# Patient Record
Sex: Male | Born: 1945 | Race: Black or African American | Hispanic: No | Smoking: Former smoker
Health system: Southern US, Community
[De-identification: ages and names within clinical notes are randomized; demographics above are authoritative.]

## PROBLEM LIST (undated history)

## (undated) DIAGNOSIS — E119 Type 2 diabetes mellitus without complications: Secondary | ICD-10-CM

## (undated) DIAGNOSIS — Z992 Dependence on renal dialysis: Secondary | ICD-10-CM

## (undated) DIAGNOSIS — I429 Cardiomyopathy, unspecified: Secondary | ICD-10-CM

## (undated) DIAGNOSIS — N186 End stage renal disease: Secondary | ICD-10-CM

## (undated) DIAGNOSIS — I10 Essential (primary) hypertension: Secondary | ICD-10-CM

## (undated) DIAGNOSIS — M869 Osteomyelitis, unspecified: Secondary | ICD-10-CM

## (undated) DIAGNOSIS — F4310 Post-traumatic stress disorder, unspecified: Secondary | ICD-10-CM

## (undated) HISTORY — PX: AMPUTATION: SHX166

## (undated) HISTORY — PX: CARDIAC SURGERY: SHX584

---
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
## 2009-11-15 ENCOUNTER — Ambulatory Visit: Payer: Self-pay | Admitting: Cardiovascular Disease

## 2009-11-15 ENCOUNTER — Inpatient Hospital Stay (HOSPITAL_COMMUNITY): Admission: EM | Admit: 2009-11-15 | Discharge: 2009-11-19 | Payer: Self-pay | Admitting: Emergency Medicine

## 2009-11-16 ENCOUNTER — Encounter: Payer: Self-pay | Admitting: Cardiovascular Disease

## 2009-11-17 ENCOUNTER — Encounter: Payer: Self-pay | Admitting: Cardiovascular Disease

## 2009-11-20 ENCOUNTER — Telehealth: Payer: Self-pay | Admitting: Cardiovascular Disease

## 2009-11-30 DIAGNOSIS — E669 Obesity, unspecified: Secondary | ICD-10-CM | POA: Insufficient documentation

## 2009-11-30 DIAGNOSIS — Z87891 Personal history of nicotine dependence: Secondary | ICD-10-CM

## 2009-11-30 DIAGNOSIS — I2510 Atherosclerotic heart disease of native coronary artery without angina pectoris: Secondary | ICD-10-CM | POA: Insufficient documentation

## 2009-11-30 DIAGNOSIS — I10 Essential (primary) hypertension: Secondary | ICD-10-CM | POA: Insufficient documentation

## 2009-11-30 DIAGNOSIS — E785 Hyperlipidemia, unspecified: Secondary | ICD-10-CM

## 2009-11-30 DIAGNOSIS — K219 Gastro-esophageal reflux disease without esophagitis: Secondary | ICD-10-CM | POA: Insufficient documentation

## 2009-11-30 DIAGNOSIS — E119 Type 2 diabetes mellitus without complications: Secondary | ICD-10-CM

## 2009-11-30 DIAGNOSIS — I2119 ST elevation (STEMI) myocardial infarction involving other coronary artery of inferior wall: Secondary | ICD-10-CM

## 2009-11-30 DIAGNOSIS — N259 Disorder resulting from impaired renal tubular function, unspecified: Secondary | ICD-10-CM | POA: Insufficient documentation

## 2009-12-31 ENCOUNTER — Encounter (INDEPENDENT_AMBULATORY_CARE_PROVIDER_SITE_OTHER): Payer: Self-pay | Admitting: *Deleted

## 2010-11-30 NOTE — Letter (Signed)
Summary: Appointment - Missed  [facility], [place]  [address]   [place], [place] [address]   Phone: [phone]  Fax: [phone]     December 31, 2009 MRN: [id number]   JERSEN NULTY [address], [place]  [address]   Dear Mr. Heisler,  Our records indicate you missed your appointment on 12/11/2009 with Dr. Angelena Form.                                    It is very important that we reach you to reschedule this appointment. We look forward to participating in your health care needs. Please contact us at the number listed above at your earliest convenience to reschedule this appointment.     Sincerely,  LG  Public relations account executive

## 2010-11-30 NOTE — Progress Notes (Signed)
Summary: med question  Phone Note Call from Patient Call back at Home Phone [phone]   Caller: Patient Reason for Call: Talk to Nurse Summary of Call: has some med questions Initial call taken by: Darnell Level,  November 20, 2009 1:22 PM  Follow-up for Phone Call        Spoke with pt. Patient  post Drug-eluting stent placement discharged fom [facility] hospital on 11/19/09. Pt would like to know how important is for him to get the prescription Prasugrel fill. Pt. states 30 pills l cost $200.00. RN let pt. know it is very important for him to get medication asap because of the stenting. I let pt. know we do not have med. sample at this time, but we have a co-pay card  to cover a portion of medication. A card was placed at the front desk for pt. Patient aware. Follow-up by: Carollee Sires, RN, BSN,  November 20, 2009 2:11 PM     Appended Document: med question A free sample offer card to receive a 30 day free tablets of Prasugrel placed at the front desk for pt. pt. aware.

## 2011-01-17 LAB — CBC
Hemoglobin: 12.3 g/dL — ABNORMAL LOW (ref 13.0–17.0)
MCHC: 33.2 g/dL (ref 30.0–36.0)
MCHC: 34.1 g/dL (ref 30.0–36.0)
MCV: 86.7 fL (ref 78.0–100.0)
MCV: 87 fL (ref 78.0–100.0)
Platelets: 202 10*3/uL (ref 150–400)
Platelets: 235 10*3/uL (ref 150–400)
Platelets: 280 10*3/uL (ref 150–400)
RBC: 4.16 MIL/uL — ABNORMAL LOW (ref 4.22–5.81)
RBC: 4.22 MIL/uL (ref 4.22–5.81)
RBC: 4.99 MIL/uL (ref 4.22–5.81)
WBC: 10.1 10*3/uL (ref 4.0–10.5)
WBC: 10.9 10*3/uL — ABNORMAL HIGH (ref 4.0–10.5)
WBC: 7.8 10*3/uL (ref 4.0–10.5)
WBC: 9.1 10*3/uL (ref 4.0–10.5)

## 2011-01-17 LAB — BASIC METABOLIC PANEL
BUN: 13 mg/dL (ref 6–23)
BUN: 8 mg/dL (ref 6–23)
CO2: 27 mEq/L (ref 19–32)
CO2: 29 mEq/L (ref 19–32)
Calcium: 8.2 mg/dL — ABNORMAL LOW (ref 8.4–10.5)
Calcium: 8.3 mg/dL — ABNORMAL LOW (ref 8.4–10.5)
Calcium: 8.5 mg/dL (ref 8.4–10.5)
Calcium: 8.5 mg/dL (ref 8.4–10.5)
Calcium: 8.6 mg/dL (ref 8.4–10.5)
Chloride: 101 mEq/L (ref 96–112)
Chloride: 104 mEq/L (ref 96–112)
Chloride: 105 mEq/L (ref 96–112)
Chloride: 105 mEq/L (ref 96–112)
Creatinine, Ser: 1.24 mg/dL (ref 0.4–1.5)
Creatinine, Ser: 1.37 mg/dL (ref 0.4–1.5)
Creatinine, Ser: 1.47 mg/dL (ref 0.4–1.5)
Creatinine, Ser: 1.54 mg/dL — ABNORMAL HIGH (ref 0.4–1.5)
GFR calc Af Amer: 56 mL/min — ABNORMAL LOW (ref >60)
GFR calc Af Amer: >60 mL/min (ref >60)
GFR calc Af Amer: >60 mL/min (ref >60)
GFR calc Af Amer: >60 mL/min (ref >60)
GFR calc non Af Amer: 53 mL/min — ABNORMAL LOW (ref >60)
GFR calc non Af Amer: 59 mL/min — ABNORMAL LOW (ref >60)
Glucose, Bld: 184 mg/dL — ABNORMAL HIGH (ref 70–99)
Sodium: 138 mEq/L (ref 135–145)

## 2011-01-17 LAB — POCT I-STAT, CHEM 8
Calcium, Ion: 0.99 mmol/L — ABNORMAL LOW (ref 1.12–1.32)
Chloride: 104 mEq/L (ref 96–112)
Creatinine, Ser: 1.9 mg/dL — ABNORMAL HIGH (ref 0.4–1.5)
Glucose, Bld: 185 mg/dL — ABNORMAL HIGH (ref 70–99)
Potassium: 3.3 mEq/L — ABNORMAL LOW (ref 3.5–5.1)

## 2011-01-17 LAB — GLUCOSE, CAPILLARY
Glucose-Capillary: 125 mg/dL — ABNORMAL HIGH (ref 70–99)
Glucose-Capillary: 131 mg/dL — ABNORMAL HIGH (ref 70–99)
Glucose-Capillary: 135 mg/dL — ABNORMAL HIGH (ref 70–99)
Glucose-Capillary: 139 mg/dL — ABNORMAL HIGH (ref 70–99)
Glucose-Capillary: 139 mg/dL — ABNORMAL HIGH (ref 70–99)
Glucose-Capillary: 145 mg/dL — ABNORMAL HIGH (ref 70–99)
Glucose-Capillary: 159 mg/dL — ABNORMAL HIGH (ref 70–99)
Glucose-Capillary: 167 mg/dL — ABNORMAL HIGH (ref 70–99)
Glucose-Capillary: 218 mg/dL — ABNORMAL HIGH (ref 70–99)
Glucose-Capillary: 228 mg/dL — ABNORMAL HIGH (ref 70–99)
Glucose-Capillary: 229 mg/dL — ABNORMAL HIGH (ref 70–99)

## 2011-01-17 LAB — DIFFERENTIAL
Basophils Absolute: 0 10*3/uL (ref 0.0–0.1)
Basophils Relative: 0 % (ref 0–1)
Eosinophils Absolute: 0.4 10*3/uL (ref 0.0–0.7)
Monocytes Absolute: 0.8 10*3/uL (ref 0.1–1.0)
Monocytes Relative: 7 % (ref 3–12)
Neutrophils Relative %: 66 % (ref 43–77)

## 2011-01-17 LAB — COMPREHENSIVE METABOLIC PANEL
AST: 27 U/L (ref 0–37)
Albumin: 3.2 g/dL — ABNORMAL LOW (ref 3.5–5.2)
Calcium: 8 mg/dL — ABNORMAL LOW (ref 8.4–10.5)
Chloride: 104 mEq/L (ref 96–112)
Creatinine, Ser: 1.53 mg/dL — ABNORMAL HIGH (ref 0.4–1.5)
GFR calc Af Amer: 56 mL/min — ABNORMAL LOW (ref >60)
Total Bilirubin: 0.5 mg/dL (ref 0.3–1.2)

## 2011-01-17 LAB — CARDIAC PANEL(CRET KIN+CKTOT+MB+TROPI)
CK, MB: 11.3 ng/mL (ref 0.3–4.0)
Relative Index: 5.1 — ABNORMAL HIGH (ref 0.0–2.5)
Total CK: 222 U/L (ref 7–232)
Troponin I: 1.82 ng/mL (ref 0.00–0.06)
Troponin I: 2.42 ng/mL (ref 0.00–0.06)

## 2011-01-17 LAB — CK TOTAL AND CKMB (NOT AT ARMC)
CK, MB: 1.1 ng/mL (ref 0.3–4.0)
Total CK: 138 U/L (ref 7–232)

## 2011-01-17 LAB — LIPID PANEL
HDL: 31 mg/dL — ABNORMAL LOW (ref >39)
Triglycerides: 77 mg/dL (ref <150)
VLDL: 15 mg/dL (ref 0–40)

## 2011-01-17 LAB — POCT CARDIAC MARKERS
CKMB, poc: 1.1 ng/mL (ref 1.0–8.0)
Myoglobin, poc: 145 ng/mL (ref 12–200)
Troponin i, poc: <0.05 ng/mL (ref 0.00–0.09)

## 2011-01-17 LAB — APTT: aPTT: 29 seconds (ref 24–37)

## 2015-07-29 ENCOUNTER — Emergency Department (HOSPITAL_COMMUNITY)
Admission: EM | Admit: 2015-07-29 | Discharge: 2015-07-29 | Disposition: A | Discharge status: Home / Self Care | Payer: Medicare Other | Attending: Emergency Medicine | Admitting: Emergency Medicine

## 2015-07-29 ENCOUNTER — Emergency Department (HOSPITAL_COMMUNITY): Payer: Medicare Other

## 2015-07-29 ENCOUNTER — Encounter (HOSPITAL_COMMUNITY): Payer: Self-pay | Admitting: *Deleted

## 2015-07-29 DIAGNOSIS — F4310 Post-traumatic stress disorder, unspecified: Secondary | ICD-10-CM | POA: Insufficient documentation

## 2015-07-29 DIAGNOSIS — I10 Essential (primary) hypertension: Secondary | ICD-10-CM | POA: Insufficient documentation

## 2015-07-29 DIAGNOSIS — Z7982 Long term (current) use of aspirin: Secondary | ICD-10-CM | POA: Insufficient documentation

## 2015-07-29 DIAGNOSIS — E119 Type 2 diabetes mellitus without complications: Secondary | ICD-10-CM | POA: Diagnosis not present

## 2015-07-29 DIAGNOSIS — N281 Cyst of kidney, acquired: Secondary | ICD-10-CM | POA: Insufficient documentation

## 2015-07-29 DIAGNOSIS — Z87891 Personal history of nicotine dependence: Secondary | ICD-10-CM | POA: Diagnosis not present

## 2015-07-29 DIAGNOSIS — R911 Solitary pulmonary nodule: Secondary | ICD-10-CM | POA: Diagnosis not present

## 2015-07-29 DIAGNOSIS — M545 Low back pain: Secondary | ICD-10-CM | POA: Diagnosis present

## 2015-07-29 DIAGNOSIS — Z79899 Other long term (current) drug therapy: Secondary | ICD-10-CM | POA: Insufficient documentation

## 2015-07-29 DIAGNOSIS — Z794 Long term (current) use of insulin: Secondary | ICD-10-CM | POA: Insufficient documentation

## 2015-07-29 DIAGNOSIS — R109 Unspecified abdominal pain: Secondary | ICD-10-CM

## 2015-07-29 HISTORY — DX: Post-traumatic stress disorder, unspecified: F43.10

## 2015-07-29 HISTORY — DX: Essential (primary) hypertension: I10

## 2015-07-29 HISTORY — DX: Type 2 diabetes mellitus without complications: E11.9

## 2015-07-29 LAB — COMPREHENSIVE METABOLIC PANEL
ALK PHOS: 85 U/L (ref 38–126)
ALT: 16 U/L — ABNORMAL LOW (ref 17–63)
ANION GAP: 10 (ref 5–15)
AST: 21 U/L (ref 15–41)
Albumin: 3.6 g/dL (ref 3.5–5.0)
BILIRUBIN TOTAL: 0.6 mg/dL (ref 0.3–1.2)
BUN: 13 mg/dL (ref 6–20)
CALCIUM: 9.1 mg/dL (ref 8.9–10.3)
CO2: 29 mmol/L (ref 22–32)
Chloride: 99 mmol/L — ABNORMAL LOW (ref 101–111)
Creatinine, Ser: 1.59 mg/dL — ABNORMAL HIGH (ref 0.61–1.24)
GFR calc non Af Amer: 43 mL/min — ABNORMAL LOW (ref >60)
GFR, EST AFRICAN AMERICAN: 50 mL/min — AB (ref >60)
Glucose, Bld: 104 mg/dL — ABNORMAL HIGH (ref 65–99)
Potassium: 3.1 mmol/L — ABNORMAL LOW (ref 3.5–5.1)
Sodium: 138 mmol/L (ref 135–145)
TOTAL PROTEIN: 7.1 g/dL (ref 6.5–8.1)

## 2015-07-29 LAB — URINALYSIS, ROUTINE W REFLEX MICROSCOPIC
BILIRUBIN URINE: NEGATIVE
GLUCOSE, UA: NEGATIVE mg/dL
Hgb urine dipstick: NEGATIVE
Ketones, ur: NEGATIVE mg/dL
Leukocytes, UA: NEGATIVE
NITRITE: NEGATIVE
PH: 7 (ref 5.0–8.0)
Protein, ur: NEGATIVE mg/dL
SPECIFIC GRAVITY, URINE: 1.016 (ref 1.005–1.030)
Urobilinogen, UA: 0.2 mg/dL (ref 0.0–1.0)

## 2015-07-29 LAB — CBC WITH DIFFERENTIAL/PLATELET
Basophils Absolute: 0.1 10*3/uL (ref 0.0–0.1)
Basophils Relative: 1 %
EOS ABS: 0.1 10*3/uL (ref 0.0–0.7)
Eosinophils Relative: 1 %
HEMATOCRIT: 40.4 % (ref 39.0–52.0)
HEMOGLOBIN: 13.7 g/dL (ref 13.0–17.0)
LYMPHS ABS: 2.9 10*3/uL (ref 0.7–4.0)
Lymphocytes Relative: 28 %
MCH: 28.4 pg (ref 26.0–34.0)
MCHC: 33.9 g/dL (ref 30.0–36.0)
MCV: 83.6 fL (ref 78.0–100.0)
MONO ABS: 0.7 10*3/uL (ref 0.1–1.0)
MONOS PCT: 7 %
NEUTROS ABS: 6.6 10*3/uL (ref 1.7–7.7)
NEUTROS PCT: 63 %
Platelets: 280 10*3/uL (ref 150–400)
RBC: 4.83 MIL/uL (ref 4.22–5.81)
RDW: 14.9 % (ref 11.5–15.5)
WBC: 10.4 10*3/uL (ref 4.0–10.5)

## 2015-07-29 MED ORDER — IOHEXOL 300 MG/ML  SOLN
25.0000 mL | Freq: Once | INTRAMUSCULAR | Status: AC | PRN
  Administered 2015-07-29: 25 mL via ORAL

## 2015-07-29 MED ORDER — IOHEXOL 300 MG/ML  SOLN
100.0000 mL | Freq: Once | INTRAMUSCULAR | Status: AC | PRN
  Administered 2015-07-29: 100 mL via INTRAVENOUS

## 2015-07-29 MED ORDER — ONDANSETRON 4 MG PO TBDP: 4.0000 mg | ORAL_TABLET | Freq: Three times a day (TID) | ORAL | Status: DC | PRN

## 2015-07-29 MED ORDER — HYDROCODONE-ACETAMINOPHEN 5-325 MG PO TABS: 1.0000 | ORAL_TABLET | Freq: Four times a day (QID) | ORAL | Status: DC | PRN

## 2015-07-29 MED ORDER — MORPHINE SULFATE (PF) 4 MG/ML IV SOLN
4.0000 mg | Freq: Once | INTRAVENOUS | Status: AC
  Administered 2015-07-29: 4 mg via INTRAVENOUS
  Filled 2015-07-29: qty 1

## 2015-07-29 MED ORDER — POLYETHYLENE GLYCOL 3350 17 GM/SCOOP PO POWD: ORAL | Status: DC

## 2015-07-29 MED ORDER — ONDANSETRON HCL 4 MG/2ML IJ SOLN
4.0000 mg | Freq: Once | INTRAMUSCULAR | Status: AC
  Administered 2015-07-29: 4 mg via INTRAVENOUS
  Filled 2015-07-29: qty 2

## 2015-07-29 NOTE — Discharge Instructions (Signed)
Please follow up with your primary care physician in 1-2 days. If you do not have one please call the [facility] number listed above. Please have your family doctor schedule the outpatient images as discussed with you today. Please take pain medication and/or muscle relaxants as prescribed and as needed for pain. Please do not drive on narcotic pain medication or on muscle relaxants. Please read all discharge instructions and return precautions.    Abdominal Pain Many things can cause abdominal pain. Usually, abdominal pain is not caused by a disease and will improve without treatment. It can often be observed and treated at home. Your health care provider will do a physical exam and possibly order blood tests and X-rays to help determine the seriousness of your pain. However, in many cases, more time must pass before a clear cause of the pain can be found. Before that point, your health care provider may not know if you need more testing or further treatment. HOME CARE INSTRUCTIONS  Monitor your abdominal pain for any changes. The following actions may help to alleviate any discomfort you are experiencing:  Only take over-the-counter or prescription medicines as directed by your health care provider.  Do not take laxatives unless directed to do so by your health care provider.  Try a clear liquid diet (broth, tea, or water) as directed by your health care provider. Slowly move to a bland diet as tolerated. SEEK MEDICAL CARE IF:  You have unexplained abdominal pain.  You have abdominal pain associated with nausea or diarrhea.  You have pain when you urinate or have a bowel movement.  You experience abdominal pain that wakes you in the night.  You have abdominal pain that is worsened or improved by eating food.  You have abdominal pain that is worsened with eating fatty foods.  You have a fever. SEEK IMMEDIATE MEDICAL CARE IF:   Your pain does not go away within 2  hours.  You keep throwing up (vomiting).  Your pain is felt only in portions of the abdomen, such as the right side or the left lower portion of the abdomen.  You pass bloody or black tarry stools. MAKE SURE YOU:  Understand these instructions.   Will watch your condition.   Will get help right away if you are not doing well or get worse.  Document Released: 07/27/2005 Document Revised: 10/22/2013 Document Reviewed: 06/26/2013 [facility] Patient Information 2015 [place], [place]. This information is not intended to replace advice given to you by your health care provider. Make sure you discuss any questions you have with your health care provider.

## 2015-07-29 NOTE — ED Provider Notes (Signed)
CSN: [id number]     Arrival date & time 07/29/15  1350 History   First MD Initiated Contact with Patient 07/29/15 1609     Chief Complaint  Patient presents with  . Back Pain     (Consider location/radiation/quality/duration/timing/severity/associated sxs/prior Treatment) HPI Comments: Patient is a 69 yo M PMHx significant for DM, HTN presenting to the ED for evaluation of left sided abdominal/back pain that began 1 week ago. Patient states the pain has worsened over the last week. Describes it as "someone punching me in my side." He states the severity waxes and wanes. Initially thought he was constipated, but took a laxative and has had BMs with no alleviation of pain. He denies any fevers, cough, CP, shortness of breath, vomiting, diarrhea, urinary symptoms, numbness or weakness in his extremities, falls or trauma. No abdominal surgical history. Denies any similar pain in the past. Denies any history of kidney infections or kidney stones.  Patient is a 69 y.o. male presenting with back pain.  Back Pain   Past Medical History  Diagnosis Date  . Diabetes mellitus without complication   . Hypertension   . PTSD (post-traumatic stress disorder)    Past Surgical History  Procedure Laterality Date  . Cardiac surgery     No family history on file. Social History  Substance Use Topics  . Smoking status: Former Research scientist (life sciences)  . Smokeless tobacco: None  . Alcohol Use: None    Review of Systems  Gastrointestinal: Negative for nausea, vomiting and diarrhea.  Musculoskeletal: Positive for back pain.  All other systems reviewed and are negative.     Allergies  Review of patient's allergies indicates no known allergies.  Home Medications   Prior to Admission medications   Medication Sig Start Date End Date Taking? Authorizing Provider  aspirin 325 MG EC tablet Take 325 mg by mouth daily.   Yes Historical Provider, MD  CIALIS 10 MG tablet Take 10 mg by mouth daily as needed for erectile  dysfunction.  06/06/15  Yes Historical Provider, MD  diltiazem (TIAZAC) 360 MG 24 hr capsule Take 360 mg by mouth daily.   Yes Historical Provider, MD  furosemide (LASIX) 20 MG tablet Take 20 mg by mouth daily.   Yes Historical Provider, MD  glipiZIDE (GLUCOTROL) 10 MG tablet Take 20 mg by mouth daily before breakfast.   Yes Historical Provider, MD  hydrochlorothiazide (HYDRODIURIL) 25 MG tablet Take 25 mg by mouth daily.   Yes Historical Provider, MD  insulin aspart protamine- aspart (NOVOLOG MIX 70/30) (70-30) 100 UNIT/ML injection Inject 35-45 Units into the skin 2 (two) times daily with a meal. Takes 45 units in am and 35 units in pm   Yes Historical Provider, MD  lamoTRIgine (LAMICTAL) 200 MG tablet Take 200 mg by mouth daily.   Yes Historical Provider, MD  sertraline (ZOLOFT) 100 MG tablet Take 100 mg by mouth daily.   Yes Historical Provider, MD  simvastatin (ZOCOR) 20 MG tablet Take 10 mg by mouth daily at 6 PM.   Yes Historical Provider, MD  HYDROcodone-acetaminophen (NORCO/VICODIN) 5-325 MG tablet Take 1-2 tablets by mouth every 6 (six) hours as needed for severe pain. 07/29/15   Jennifer Piepenbrink, PA-C  ondansetron (ZOFRAN ODT) 4 MG disintegrating tablet Take 1 tablet (4 mg total) by mouth every 8 (eight) hours as needed for nausea or vomiting. 07/29/15   Baron Sane, PA-C  polyethylene glycol powder (GLYCOLAX/MIRALAX) powder Take 1 capful once a day until daily soft stools  OTC 07/29/15  Jennifer Piepenbrink, PA-C   BP 129/64 mmHg  Pulse 56  Temp(Src) 99.1 F (37.3 C) (Oral)  Resp 18  Ht 5\' 6"  (1.676 m)  Wt 310 lb (140.615 kg)  BMI 50.06 kg/m2  SpO2 95% Physical Exam  Constitutional: He is oriented to person, place, and time. He appears well-developed and well-nourished. No distress.  HENT:  Head: Normocephalic and atraumatic.  Right Ear: External ear normal.  Left Ear: External ear normal.  Nose: Nose normal.  Mouth/Throat: Oropharynx is clear and moist.  Eyes:  Conjunctivae are normal.  Neck: Normal range of motion. Neck supple.  No nuchal rigidity.   Cardiovascular: Normal rate, regular rhythm and normal heart sounds.   Pulmonary/Chest: Effort normal and breath sounds normal.  Abdominal: Soft. Bowel sounds are normal. He exhibits no distension. There is tenderness. There is no rebound, no guarding and no CVA tenderness.    Obese abdomen.   Musculoskeletal: Normal range of motion.  Lymphadenopathy:       Right: No inguinal adenopathy present.       Left: No inguinal adenopathy present.  Neurological: He is alert and oriented to person, place, and time.  MAE x 4 w/o ataxia.   Skin: Skin is warm and dry. He is not diaphoretic.  Psychiatric: He has a normal mood and affect.  Nursing note and vitals reviewed.   ED Course  Procedures (including critical care time) Medications  ondansetron (ZOFRAN) injection 4 mg (4 mg Intravenous Given 07/29/15 1721)  morphine 4 MG/ML injection 4 mg (4 mg Intravenous Given 07/29/15 1721)  iohexol (OMNIPAQUE) 300 MG/ML solution 25 mL (25 mLs Oral Contrast Given 07/29/15 1717)  iohexol (OMNIPAQUE) 300 MG/ML solution 100 mL (100 mLs Intravenous Contrast Given 07/29/15 1806)    Labs Review Labs Reviewed  COMPREHENSIVE METABOLIC PANEL - Abnormal; Notable for the following:    Potassium 3.1 (*)    Chloride 99 (*)    Glucose, Bld 104 (*)    Creatinine, Ser 1.59 (*)    ALT 16 (*)    GFR calc non Af Amer 43 (*)    GFR calc Af Amer 50 (*)    All other components within normal limits  URINE CULTURE  URINALYSIS, ROUTINE W REFLEX MICROSCOPIC (NOT AT [facility])  CBC WITH DIFFERENTIAL/PLATELET    Imaging Review Ct Abdomen Pelvis W Contrast  07/29/2015   CLINICAL DATA:  Left-sided abdominal pain for 1 week.  EXAM: CT ABDOMEN AND PELVIS WITH CONTRAST  TECHNIQUE: Multidetector CT imaging of the abdomen and pelvis was performed using the standard protocol following bolus administration of intravenous contrast.  CONTRAST:   142mL OMNIPAQUE IOHEXOL 300 MG/ML  SOLN  COMPARISON:  None.  FINDINGS: Lower chest: The lung bases are clear of acute process. Left basilar scarring changes are noted. No worrisome pulmonary lesions. A 3.5 mm right middle lobe pulmonary nodule is noted on image number 9.  Hepatobiliary: No focal hepatic lesion or intrahepatic biliary dilatation. The gallbladder is normal. No common bile duct dilatation.  Pancreas: No mass, inflammation or ductal dilatation.  Spleen: Normal size.  No focal lesions.  Adrenals/Urinary Tract: The adrenal glands are normal.  There is an exophytic lower pole right renal lesion measuring 3 x 3 x 3 cm. It has heterogeneous attenuation with maximal Hounsfield units of 30. It could be a complex cyst but could not exclude a solid renal neoplasm. Recommend MRI abdomen without and with contrast for further evaluation. No renal calculi or obstructing ureteral calculi. The bladder is normal.  Stomach/Bowel: The stomach, duodenum, small bowel and colon are unremarkable. No inflammatory changes, mass lesions or obstructive findings. The terminal ileum is normal. The appendix is normal.  Vascular/Lymphatic: No mesenteric or retroperitoneal mass or adenopathy. Small scattered lymph nodes are noted. Scattered aortic calcifications but no aneurysm or dissection.  Other: The bladder, prostate gland and seminal vesicles are unremarkable. No pelvic mass or adenopathy. Small scattered lymph nodes are noted. No free pelvic fluid collections.  Musculoskeletal: No significant findings.  IMPRESSION: 1. No acute abdominal findings. 2. Coronary artery calcifications. 3. 3 x 3 x 3 cm lower pole right renal lesion could be a complex cyst or a renal neoplasm. Recommend MRI abdomen without and with contrast for further evaluation. 4. 3.5 mm right middle lobe pulmonary nodule. If the renal lesion is a solid renal neoplasm by MRI I would recommend a full chest CT for further evaluation of the rest of the lung fields.    Electronically Signed   By: Marijo Sanes M.D.   On: 07/29/2015 18:38   I have personally reviewed and evaluated these images and lab results as part of my medical decision-making.   EKG Interpretation None      7:53 PM Discussed CT scan results with patient who has a PCP f/u appointment.   MDM   Final diagnoses:  Left sided abdominal pain  Renal cyst, right  Pulmonary nodule, right   Filed Vitals:   07/29/15 1945  BP: 129/64  Pulse:   Temp:   Resp:    Afebrile, NAD, non-toxic appearing, AAOx4.   Patient is nontoxic, nonseptic appearing, in no apparent distress.  Patient's pain and other symptoms adequately managed in emergency department.  Labs, imaging and vitals reviewed.  Slight increase in creatinine advised PCP recheck. Patient does not meet the SIRS or Sepsis criteria.  On repeat exam patient does not have a surgical abdomen and there are no peritoneal signs.  No indication of appendicitis, bowel obstruction, bowel perforation, cholecystitis, diverticulitis, GU origin of pain. Discussed need for follow up on pulmonary nodule and renal cyst.  Patient discharged home with symptomatic treatment and given strict instructions for follow-up with their primary care physician.  I have also discussed reasons to return immediately to the ER.  Patient expresses understanding and agrees with plan. Patient d/w with Dr. Oleta Mouse, agrees with plan.        Baron Sane, PA-C 07/29/15 2101  Forde Dandy, MD 07/30/15 [phone]

## 2015-07-29 NOTE — ED Notes (Signed)
Pt states that he has had left lower back pain for several days. Pt states that he was constipated but took medications and thought that would relieve his pain however it did not. Pt states that pain continued even after bowel movement. Pt denies N/V/D or abdominal pain. Pt denies any urinary symptoms.

## 2015-07-31 LAB — URINE CULTURE

## 2018-05-08 ENCOUNTER — Emergency Department (HOSPITAL_COMMUNITY)
Admission: EM | Admit: 2018-05-08 | Discharge: 2018-05-08 | Disposition: A | Discharge status: Home / Self Care | Payer: Self-pay | Attending: Emergency Medicine | Admitting: Emergency Medicine

## 2018-05-08 ENCOUNTER — Other Ambulatory Visit: Payer: Self-pay

## 2018-05-08 ENCOUNTER — Encounter (HOSPITAL_COMMUNITY): Payer: Self-pay | Admitting: Emergency Medicine

## 2018-05-08 DIAGNOSIS — Y999 Unspecified external cause status: Secondary | ICD-10-CM | POA: Insufficient documentation

## 2018-05-08 DIAGNOSIS — I252 Old myocardial infarction: Secondary | ICD-10-CM | POA: Insufficient documentation

## 2018-05-08 DIAGNOSIS — Z794 Long term (current) use of insulin: Secondary | ICD-10-CM | POA: Insufficient documentation

## 2018-05-08 DIAGNOSIS — S39012A Strain of muscle, fascia and tendon of lower back, initial encounter: Secondary | ICD-10-CM | POA: Insufficient documentation

## 2018-05-08 DIAGNOSIS — N186 End stage renal disease: Secondary | ICD-10-CM | POA: Insufficient documentation

## 2018-05-08 DIAGNOSIS — Z7982 Long term (current) use of aspirin: Secondary | ICD-10-CM | POA: Insufficient documentation

## 2018-05-08 DIAGNOSIS — I2510 Atherosclerotic heart disease of native coronary artery without angina pectoris: Secondary | ICD-10-CM | POA: Insufficient documentation

## 2018-05-08 DIAGNOSIS — E1122 Type 2 diabetes mellitus with diabetic chronic kidney disease: Secondary | ICD-10-CM | POA: Insufficient documentation

## 2018-05-08 DIAGNOSIS — X500XXA Overexertion from strenuous movement or load, initial encounter: Secondary | ICD-10-CM | POA: Insufficient documentation

## 2018-05-08 DIAGNOSIS — Z87891 Personal history of nicotine dependence: Secondary | ICD-10-CM | POA: Insufficient documentation

## 2018-05-08 DIAGNOSIS — I120 Hypertensive chronic kidney disease with stage 5 chronic kidney disease or end stage renal disease: Secondary | ICD-10-CM | POA: Insufficient documentation

## 2018-05-08 DIAGNOSIS — Y9389 Activity, other specified: Secondary | ICD-10-CM | POA: Insufficient documentation

## 2018-05-08 DIAGNOSIS — Y929 Unspecified place or not applicable: Secondary | ICD-10-CM | POA: Insufficient documentation

## 2018-05-08 LAB — BASIC METABOLIC PANEL
Anion gap: 15 (ref 5–15)
BUN: 61 mg/dL — AB (ref 8–23)
CHLORIDE: 98 mmol/L (ref 98–111)
CO2: 23 mmol/L (ref 22–32)
Calcium: 8.8 mg/dL — ABNORMAL LOW (ref 8.9–10.3)
Creatinine, Ser: 9.68 mg/dL — ABNORMAL HIGH (ref 0.61–1.24)
GFR calc Af Amer: 6 mL/min — ABNORMAL LOW (ref >60)
GFR, EST NON AFRICAN AMERICAN: 5 mL/min — AB (ref >60)
Glucose, Bld: 151 mg/dL — ABNORMAL HIGH (ref 70–99)
POTASSIUM: 4.8 mmol/L (ref 3.5–5.1)
Sodium: 136 mmol/L (ref 135–145)

## 2018-05-08 LAB — CBC WITH DIFFERENTIAL/PLATELET
ABS IMMATURE GRANULOCYTES: 0 10*3/uL (ref 0.0–0.1)
BASOS PCT: 1 %
Basophils Absolute: 0.1 10*3/uL (ref 0.0–0.1)
Eosinophils Absolute: 0.4 10*3/uL (ref 0.0–0.7)
Eosinophils Relative: 3 %
HCT: 32 % — ABNORMAL LOW (ref 39.0–52.0)
Hemoglobin: 10 g/dL — ABNORMAL LOW (ref 13.0–17.0)
Immature Granulocytes: 0 %
Lymphocytes Relative: 10 %
Lymphs Abs: 1.1 10*3/uL (ref 0.7–4.0)
MCH: 29.3 pg (ref 26.0–34.0)
MCHC: 31.3 g/dL (ref 30.0–36.0)
MCV: 93.8 fL (ref 78.0–100.0)
Monocytes Absolute: 0.5 10*3/uL (ref 0.1–1.0)
Monocytes Relative: 5 %
NEUTROS ABS: 8.8 10*3/uL — AB (ref 1.7–7.7)
Neutrophils Relative %: 81 %
PLATELETS: 250 10*3/uL (ref 150–400)
RBC: 3.41 MIL/uL — ABNORMAL LOW (ref 4.22–5.81)
RDW: 15.3 % (ref 11.5–15.5)
WBC: 10.9 10*3/uL — ABNORMAL HIGH (ref 4.0–10.5)

## 2018-05-08 LAB — URINALYSIS, ROUTINE W REFLEX MICROSCOPIC
BILIRUBIN URINE: NEGATIVE
Bacteria, UA: NONE SEEN
GLUCOSE, UA: NEGATIVE mg/dL
Hgb urine dipstick: NEGATIVE
KETONES UR: NEGATIVE mg/dL
LEUKOCYTES UA: NEGATIVE
NITRITE: NEGATIVE
PH: 6 (ref 5.0–8.0)
Protein, ur: 30 mg/dL — AB
Specific Gravity, Urine: 1.012 (ref 1.005–1.030)

## 2018-05-08 MED ORDER — ACETAMINOPHEN 500 MG PO TABS
1000.0000 mg | ORAL_TABLET | Freq: Once | ORAL | Status: AC
  Administered 2018-05-08: 1000 mg via ORAL
  Filled 2018-05-08: qty 2

## 2018-05-08 NOTE — Discharge Instructions (Signed)
BE SURE TO RESCHEDULE DIALYSIS FOR TOMORROW. TAKE TYLENOL 500-1000MG  EVERY 6 TO 8 HOURS FOR PAIN. RETURN TO ER IF YOU HAVE ANY LEG WEAKNESS OR NUMBNESS, GROIN NUMBNESS, PROBLEMS GOING TO THE BATHROOM, OR FEVER.

## 2018-05-08 NOTE — ED Triage Notes (Signed)
Pt. Stated, Connor Hoover been having left side pain since the weekend. When I went to bathroom this morning I heard something pop.

## 2018-05-08 NOTE — ED Notes (Signed)
Pt knows about needing urine sample, pt states he is on dialysis and has already gone to the bathroom twice today and may not be able to go again.

## 2018-05-08 NOTE — ED Provider Notes (Signed)
[facility] EMERGENCY DEPARTMENT Provider Note   CSN: [id number] Arrival date & time: 05/08/18  1058     History   Chief Complaint Chief Complaint  Patient presents with  . Flank Pain    HPI Connor Hoover is a 72 y.o. male.  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  Patient began having pain in his left lower back several days ago.  Pain has been constant, moderate in intensity but severe when he tries to raise his left leg and walk.  He denies any weakness or numbness of his legs.  No saddle anesthesia or incontinence.  No dysuria or hematuria.  Today he became concerned that his appendix may have ruptured because he felt a popping sensation in his back when he was going to the bathroom, which is what prompted him to come in.  He denies any trauma or major changes in physical activity but he states he did lift some luggage a few days ago and wonders if this was the source of his pain.  He has had no fevers, vomiting, breathing problems, cough/cold symptoms, or recent illness.  He was supposed to have dialysis today but came to the ED instead.  The history is provided by the patient.  Flank Pain     Past Medical History:  Diagnosis Date  . Diabetes mellitus without complication ([place])   . Hypertension   . PTSD (post-traumatic stress disorder)     Patient Active Problem List   Diagnosis Date Noted  . DIABETES MELLITUS, TYPE II, ON INSULIN 11/30/2009  . HYPERLIPIDEMIA 11/30/2009  . OBESITY 11/30/2009  . HYPERTENSION 11/30/2009  . 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 CAD, NATIVE VESSEL 11/30/2009  . GERD 11/30/2009  . RENAL INSUFFICIENCY 11/30/2009  . TOBACCO USE, QUIT 11/30/2009    Past Surgical History:  Procedure Laterality Date  . CARDIAC SURGERY          Home Medications    Prior to Admission medications   Medication Sig Start Date End Date Taking? Authorizing Provider    aspirin 325 MG EC tablet Take 325 mg by mouth daily.    [provider]  CIALIS 10 MG tablet Take 10 mg by mouth daily as needed for erectile dysfunction.  06/06/15   [provider]  diltiazem (TIAZAC) 360 MG 24 hr capsule Take 360 mg by mouth daily.    [provider]  furosemide (LASIX) 20 MG tablet Take 20 mg by mouth daily.    [provider]  glipiZIDE (GLUCOTROL) 10 MG tablet Take 20 mg by mouth daily before breakfast.    [provider]  hydrochlorothiazide (HYDRODIURIL) 25 MG tablet Take 25 mg by mouth daily.    [provider]  HYDROcodone-acetaminophen (NORCO/VICODIN) 5-325 MG tablet Take 1-2 tablets by mouth every 6 (six) hours as needed for severe pain. 07/29/15   Piepenbrink, Anderson [place], PA-C  insulin aspart protamine- aspart (NOVOLOG MIX 70/30) (70-30) 100 UNIT/ML injection Inject 35-45 Units into the skin 2 (two) times daily with a meal. Takes 45 units in am and 35 units in pm    [provider]  lamoTRIgine (LAMICTAL) 200 MG tablet Take 200 mg by mouth daily.    [provider]  ondansetron (ZOFRAN ODT) 4 MG disintegrating tablet Take 1 tablet (4 mg total) by mouth every 8 (eight) hours as needed for nausea or vomiting. 07/29/15   Baron Sane, PA-C  polyethylene glycol powder (GLYCOLAX/MIRALAX) powder Take 1 capful once a day until daily soft stools  OTC 07/29/15   Piepenbrink, Anderson [place], PA-C  sertraline (ZOLOFT) 100 MG tablet Take 100 mg by mouth daily.    [provider]  simvastatin (ZOCOR) 20 MG tablet Take 10 mg by mouth daily at 6 PM.    [provider]    Family History No family history on file.  Social History Social History   Tobacco Use  . Smoking status: Former Research scientist (life sciences)  . Smokeless tobacco: Former Network engineer Use Topics  . Alcohol use: Not Currently  . Drug use: Not Currently     Allergies   Patient has no known allergies.   Review of Systems Review of  Systems  Genitourinary: Positive for flank pain.   All other systems reviewed and are negative except that which was mentioned in HPI   Physical Exam Updated Vital Signs BP 134/71   Pulse 65   Temp 97.9 F (36.6 C) (Oral)   Resp 18   Ht 5\' 6"  (1.676 m)   Wt 110 kg (242 lb 8.1 oz)   SpO2 99%   BMI 39.14 kg/m   Physical Exam  Constitutional: He is oriented to person, place, and time. He appears well-developed and well-nourished. No distress.  HENT:  Head: Normocephalic and atraumatic.  Moist mucous membranes  Eyes: Conjunctivae are normal.  Neck: Neck supple.  Cardiovascular: Normal rate, regular rhythm and normal heart sounds.  No murmur heard. Pulmonary/Chest: Effort normal and breath sounds normal.  Abdominal: Soft. Bowel sounds are normal. He exhibits no distension. There is no tenderness.  Musculoskeletal: He exhibits no edema.  Tenderness of L lower lateral lumbar paraspinal muscles, no midline spinal tenderness  Neurological: He is alert and oriented to person, place, and time. He displays normal reflexes. No sensory deficit. He exhibits normal muscle tone.  Fluent speech BLE 5/5 strength   Skin: Skin is warm and dry.  vascath in R upper chest, no erythema  Psychiatric: He has a normal mood and affect. Judgment normal.  Nursing note and vitals reviewed.    ED Treatments / Results  Labs (all labs ordered are listed, but only abnormal results are displayed) Labs Reviewed  URINALYSIS, ROUTINE W REFLEX MICROSCOPIC - Abnormal; Notable for the following components:      Result Value   Protein, ur 30 (*)    All other components within normal limits  CBC WITH DIFFERENTIAL/PLATELET - Abnormal; Notable for the following components:   WBC 10.9 (*)    RBC 3.41 (*)    Hemoglobin 10.0 (*)    HCT 32.0 (*)    Neutro Abs 8.8 (*)    All other components within normal limits  BASIC METABOLIC PANEL - Abnormal; Notable for the following components:   Glucose, Bld 151 (*)     BUN 61 (*)    Creatinine, Ser 9.68 (*)    Calcium 8.8 (*)    GFR calc non Af Amer 5 (*)    GFR calc Af Amer 6 (*)    All other components within normal limits    EKG None  Radiology No results found.  Procedures Procedures (including critical care time)  Medications Ordered in ED Medications  acetaminophen (TYLENOL) tablet 1,000 mg (1,000 mg Oral Given 05/08/18 1625)     Initial Impression / Assessment and Plan / ED Course  I have reviewed the triage vital signs and the nursing notes.  Pertinent labs that were available during  my care of the patient were reviewed by me and considered in my medical decision making (see chart for details).    He was well-appearing on exam, neurologically intact.  No signs or symptoms to suggest infectious process or cauda equina.  He was very focally tender in left lateral lower lumbar paraspinal muscles with no midline tenderness, suggestive of lumbosacral strain. Pain with straight leg raise. He is he has been told in the past that he has degenerative disc disease.  His lab work here is overall reassuring, potassium normal and no shortness of breath complaints, he already plans to have dialysis tomorrow.  I have discussed supportive measures and PCP follow-up for referral if his pain continues.  Instructed to use Tylenol as needed.  Extensively reviewed return precautions including weakness, numbness, incontinence, or sudden changes in symptoms.  He voiced understanding.  Final Clinical Impressions(s) / ED Diagnoses   Final diagnoses:  Strain of lumbar region, initial encounter    ED Discharge Orders    None       Little, Wenda Overland, MD 05/08/18 [phone]

## 2018-07-13 ENCOUNTER — Ambulatory Visit: Payer: Medicare Other | Admitting: Podiatry

## 2018-08-23 ENCOUNTER — Encounter (HOSPITAL_COMMUNITY): Payer: Self-pay | Admitting: Emergency Medicine

## 2018-08-23 ENCOUNTER — Other Ambulatory Visit: Payer: Self-pay

## 2018-08-23 ENCOUNTER — Emergency Department (HOSPITAL_COMMUNITY)
Admission: EM | Admit: 2018-08-23 | Discharge: 2018-08-23 | Disposition: A | Discharge status: Home / Self Care | Payer: Non-veteran care | Attending: Emergency Medicine | Admitting: Emergency Medicine

## 2018-08-23 DIAGNOSIS — Z79899 Other long term (current) drug therapy: Secondary | ICD-10-CM | POA: Diagnosis not present

## 2018-08-23 DIAGNOSIS — I10 Essential (primary) hypertension: Secondary | ICD-10-CM | POA: Insufficient documentation

## 2018-08-23 DIAGNOSIS — Z7982 Long term (current) use of aspirin: Secondary | ICD-10-CM | POA: Diagnosis not present

## 2018-08-23 DIAGNOSIS — Z794 Long term (current) use of insulin: Secondary | ICD-10-CM | POA: Insufficient documentation

## 2018-08-23 DIAGNOSIS — Z87891 Personal history of nicotine dependence: Secondary | ICD-10-CM | POA: Insufficient documentation

## 2018-08-23 DIAGNOSIS — M62838 Other muscle spasm: Secondary | ICD-10-CM | POA: Diagnosis present

## 2018-08-23 DIAGNOSIS — E119 Type 2 diabetes mellitus without complications: Secondary | ICD-10-CM | POA: Diagnosis not present

## 2018-08-23 LAB — COMPREHENSIVE METABOLIC PANEL
ALBUMIN: 3.9 g/dL (ref 3.5–5.0)
ALK PHOS: 133 U/L — AB (ref 38–126)
ALT: 85 U/L — ABNORMAL HIGH (ref 0–44)
ANION GAP: 11 (ref 5–15)
AST: 78 U/L — ABNORMAL HIGH (ref 15–41)
BUN: 19 mg/dL (ref 8–23)
CHLORIDE: 95 mmol/L — AB (ref 98–111)
CO2: 31 mmol/L (ref 22–32)
Calcium: 8.7 mg/dL — ABNORMAL LOW (ref 8.9–10.3)
Creatinine, Ser: 4.3 mg/dL — ABNORMAL HIGH (ref 0.61–1.24)
GFR calc Af Amer: 15 mL/min — ABNORMAL LOW (ref >60)
GFR calc non Af Amer: 13 mL/min — ABNORMAL LOW (ref >60)
GLUCOSE: 76 mg/dL (ref 70–99)
POTASSIUM: 3.6 mmol/L (ref 3.5–5.1)
SODIUM: 137 mmol/L (ref 135–145)
Total Bilirubin: 1 mg/dL (ref 0.3–1.2)
Total Protein: 8.3 g/dL — ABNORMAL HIGH (ref 6.5–8.1)

## 2018-08-23 LAB — CBC WITH DIFFERENTIAL/PLATELET
Abs Immature Granulocytes: 0.04 10*3/uL (ref 0.00–0.07)
Basophils Absolute: 0.1 10*3/uL (ref 0.0–0.1)
Basophils Relative: 1 %
Eosinophils Absolute: 0.3 10*3/uL (ref 0.0–0.5)
Eosinophils Relative: 4 %
HEMATOCRIT: 37.6 % — AB (ref 39.0–52.0)
Hemoglobin: 12.1 g/dL — ABNORMAL LOW (ref 13.0–17.0)
IMMATURE GRANULOCYTES: 1 %
LYMPHS ABS: 1.3 10*3/uL (ref 0.7–4.0)
LYMPHS PCT: 17 %
MCH: 29.2 pg (ref 26.0–34.0)
MCHC: 32.2 g/dL (ref 30.0–36.0)
MCV: 90.6 fL (ref 80.0–100.0)
MONOS PCT: 10 %
Monocytes Absolute: 0.8 10*3/uL (ref 0.1–1.0)
NEUTROS PCT: 67 %
Neutro Abs: 5.3 10*3/uL (ref 1.7–7.7)
Platelets: 209 10*3/uL (ref 150–400)
RBC: 4.15 MIL/uL — ABNORMAL LOW (ref 4.22–5.81)
RDW: 14.5 % (ref 11.5–15.5)
WBC: 7.7 10*3/uL (ref 4.0–10.5)
nRBC: 0 % (ref 0.0–0.2)

## 2018-08-23 LAB — PHOSPHORUS: Phosphorus: 3.6 mg/dL (ref 2.5–4.6)

## 2018-08-23 LAB — MAGNESIUM: Magnesium: 2 mg/dL (ref 1.7–2.4)

## 2018-08-23 MED ORDER — DIAZEPAM 5 MG PO TABS
5.0000 mg | ORAL_TABLET | Freq: Once | ORAL | Status: AC
  Administered 2018-08-23: 5 mg via ORAL
  Filled 2018-08-23: qty 1

## 2018-08-23 MED ORDER — CALCIUM CARBONATE ANTACID 500 MG PO CHEW
400.0000 mg | CHEWABLE_TABLET | Freq: Once | ORAL | Status: AC
  Administered 2018-08-23: 400 mg via ORAL
  Filled 2018-08-23: qty 2

## 2018-08-23 MED ORDER — DIAZEPAM 5 MG PO TABS: 5.0000 mg | ORAL_TABLET | Freq: Two times a day (BID) | ORAL | 0 refills | Status: AC | PRN

## 2018-08-23 NOTE — Discharge Instructions (Signed)
You were prescribed a medication (valium) for muscle spasms.  Be very careful with the medication as it makes people sleepy.  Do not drive or do anything important after you take the medication.  Follow-up with your kidney doctor regarding your symptoms.  Return to the ER if you have any chest pain or breathing problems.

## 2018-08-23 NOTE — ED Notes (Signed)
Patient verbalizes understanding of discharge instructions. Opportunity for questioning and answers were provided. Pt discharged from ED. 

## 2018-08-23 NOTE — ED Triage Notes (Signed)
Pt having muscle twitching and spasms started last night- got worse with dialysis today- has had similar episode in past when he quit taking meds- pt thinks that dialysis is taking all of his meds of of his system. Continual twitching and spasms noted.

## 2018-08-23 NOTE — ED Provider Notes (Signed)
[facility] Provider Note   CSN: [id number] Arrival date & time: 08/23/18  1126     History   Chief Complaint Chief Complaint  Patient presents with  . withdrawal from meds  . Spasms    HPI Connor Hoover is a 72 y.o. male.  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  Patient began having muscle twitching and spasms last night.  They seem to get worse after his normal dialysis session today.  He states that he has had this twice previously.  In the past he thought that it was related to missing a dose of Lamictal but he has been compliant with Lamictal recently and took his last dose last night.  He had switched it to nighttime dosing so that he did not have dialysis just after he takes it.  He had his normal dialysis session today and tolerated it fine.  He denies any associated pain, breathing problems, fevers, vomiting, diarrhea, or recent illness.  He has been eating and drinking normally and has been compliant with all of his other medications.  He denies any use of benzodiazepines or opiates.  The history is provided by the patient.    Past Medical History:  Diagnosis Date  . Diabetes mellitus without complication ([place])   . Hypertension   . PTSD (post-traumatic stress disorder)     Patient Active Problem List   Diagnosis Date Noted  . DIABETES MELLITUS, TYPE II, ON INSULIN 11/30/2009  . HYPERLIPIDEMIA 11/30/2009  . OBESITY 11/30/2009  . HYPERTENSION 11/30/2009  . 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 CAD, NATIVE VESSEL 11/30/2009  . GERD 11/30/2009  . RENAL INSUFFICIENCY 11/30/2009  . TOBACCO USE, QUIT 11/30/2009    Past Surgical History:  Procedure Laterality Date  . CARDIAC SURGERY          Home Medications    Prior to Admission medications   Medication Sig Start Date End Date Taking? Authorizing Provider  amitriptyline (ELAVIL) 25  MG tablet Take 25 mg by mouth at bedtime.   Yes [provider]  aspirin 81 MG chewable tablet Chew 81 mg by mouth daily.   Yes [provider]  atorvastatin (LIPITOR) 80 MG tablet Take 80 mg by mouth at bedtime.   Yes [provider]  CIALIS 10 MG tablet Take 10 mg by mouth daily as needed for erectile dysfunction.  06/06/15  Yes [provider]  diltiazem (CARDIZEM) 30 MG tablet Take 30 mg by mouth every 8 (eight) hours.   Yes [provider]  insulin aspart (NOVOLOG) 100 UNIT/ML injection Inject 6 Units into the skin 3 (three) times daily before meals.   Yes [provider]  insulin glargine (LANTUS) 100 UNIT/ML injection Inject 26 Units into the skin at bedtime.   Yes [provider]  lamoTRIgine (LAMICTAL) 150 MG tablet Take 150 mg by mouth every morning.   Yes [provider]  Multiple Vitamin (RENAL MULTIVITAMIN/ZINC PO) Take 1 tablet by mouth daily with supper.   Yes [provider]  sertraline (ZOLOFT) 100 MG tablet Take 100 mg by mouth daily.   Yes [provider]  sevelamer carbonate (RENVELA) 800 MG tablet Take 800 mg by mouth See admin instructions. Take 800 mg by mouth three times a day with meals and 800 mg with one snack/limit of 5 tablets in 24 hours   Yes [provider]  diazepam (  VALIUM) 5 MG tablet Take 1 tablet (5 mg total) by mouth every 12 (twelve) hours as needed for up to 3 days for muscle spasms. 08/23/18 08/26/18  Little, Wenda Overland, MD  digoxin (LANOXIN) 0.125 MG tablet Take 0.0625 mg by mouth See admin instructions. Take 0.0625 mg by mouth once a day and hold if pulse is less than 60, then call MD    [provider]  HYDROcodone-acetaminophen (NORCO/VICODIN) 5-325 MG tablet Take 1-2 tablets by mouth every 6 (six) hours as needed for severe pain. Patient not taking: Reported on 08/23/2018 07/29/15   Piepenbrink, Anderson Malta, PA-C  ondansetron (ZOFRAN ODT) 4 MG  disintegrating tablet Take 1 tablet (4 mg total) by mouth every 8 (eight) hours as needed for nausea or vomiting. Patient not taking: Reported on 08/23/2018 07/29/15   Piepenbrink, Anderson Malta, PA-C  polyethylene glycol powder (GLYCOLAX/MIRALAX) powder Take 1 capful once a day until daily soft stools  OTC Patient not taking: Reported on 08/23/2018 07/29/15   Baron Sane, PA-C    Family History No family history on file.  Social History Social History   Tobacco Use  . Smoking status: Former Research scientist (life sciences)  . Smokeless tobacco: Former Network engineer Use Topics  . Alcohol use: Not Currently  . Drug use: Not Currently     Allergies   Lisinopril and Povidine [povidone iodine]   Review of Systems Review of Systems All other systems reviewed and are negative except that which was mentioned in HPI   Physical Exam Updated Vital Signs BP 109/83 (BP Location: Right Arm)   Pulse 82   Temp 98.9 F (37.2 C) (Oral)   Resp 15   Wt 110 kg Comment: dry weight  SpO2 95%   BMI 39.14 kg/m   Physical Exam  Constitutional: He is oriented to person, place, and time. He appears well-developed and well-nourished. No distress.  HENT:  Head: Normocephalic and atraumatic.  Moist mucous membranes  Eyes: Conjunctivae are normal.  Neck: Neck supple.  Cardiovascular: Normal rate, regular rhythm and normal heart sounds.  No murmur heard. Pulmonary/Chest: Effort normal and breath sounds normal.  Abdominal: Soft. Bowel sounds are normal. He exhibits no distension. There is no tenderness.  Musculoskeletal: He exhibits no edema.  Neurological: He is alert and oriented to person, place, and time. He displays normal reflexes. He exhibits abnormal muscle tone. Coordination normal.  Fluent speech; occasional random quick spasms of varying extremities and abdominal wall during conversation; no sustained myoclonus; no clonus b/l feet; no hyperreflexia, normal strength x all 4 ext  Skin: Skin is warm and  dry.  CVC in right upper chest, clean bandage  Psychiatric: He has a normal mood and affect. Judgment normal.  Nursing note and vitals reviewed.    ED Treatments / Results  Labs (all labs ordered are listed, but only abnormal results are displayed) Labs Reviewed  COMPREHENSIVE METABOLIC PANEL - Abnormal; Notable for the following components:      Result Value   Chloride 95 (*)    Creatinine, Ser 4.30 (*)    Calcium 8.7 (*)    Total Protein 8.3 (*)    AST 78 (*)    ALT 85 (*)    Alkaline Phosphatase 133 (*)    GFR calc non Af Amer 13 (*)    GFR calc Af Amer 15 (*)    All other components within normal limits  CBC WITH DIFFERENTIAL/PLATELET - Abnormal; Notable for the following components:   RBC 4.15 (*)    Hemoglobin  12.1 (*)    HCT 37.6 (*)    All other components within normal limits  MAGNESIUM  PHOSPHORUS    EKG None  Radiology No results found.  Procedures Procedures (including critical care time)  Medications Ordered in ED Medications  diazepam (VALIUM) tablet 5 mg (5 mg Oral Given 08/23/18 1531)  calcium carbonate (TUMS - dosed in mg elemental calcium) chewable tablet 400 mg of elemental calcium (400 mg of elemental calcium Oral Given 08/23/18 1653)     Initial Impression / Assessment and Plan / ED Course  I have reviewed the triage vital signs and the nursing notes.  Pertinent labs & imaging results that were available during my care of the patient were reviewed by me and considered in my medical decision making (see chart for details).    On exam, he was having occasional quick spasms but no sustained myoclonus. No pattern to the twitching or rhythmic movement to suggest partial seizures. DDx includes electrolyte derangement given dialysis however K, Mg, Phos normal. Calcium 8.7, doubt it is low enough to cause spasms but repleted with tums. Gave valium.  He has not changed any medications, missed any medications, or suddenly stopped any medications to  suggest withdrawal.  No evidence of dehydration.  On reassessment he was feeling better and his muscle twitching and spasms were significantly improved.  It is possible that his symptoms are related to fluid and electrolyte shifts from dialysis itself.  I recommended that he contact his nephrologist for a follow-up appointment.  Provided with a few doses of medication to help with spasms at home, cautioned on use. reviewed return precautions and he voiced understanding.  Final Clinical Impressions(s) / ED Diagnoses   Final diagnoses:  Muscle spasm    ED Discharge Orders         Ordered    diazepam (VALIUM) 5 MG tablet  Every 12 hours PRN     08/23/18 1850           Little, Wenda Overland, MD 08/23/18 [phone]

## 2018-08-23 NOTE — ED Notes (Signed)
Pt given water and [place] sandwich per Dr. Rex Kras

## 2018-10-10 ENCOUNTER — Ambulatory Visit: Payer: Medicare Other | Admitting: Podiatry

## 2018-11-07 ENCOUNTER — Emergency Department (HOSPITAL_COMMUNITY)
Admission: EM | Admit: 2018-11-07 | Discharge: 2018-11-07 | Disposition: A | Discharge status: Home / Self Care | Payer: Non-veteran care | Attending: Emergency Medicine | Admitting: Emergency Medicine

## 2018-11-07 ENCOUNTER — Encounter (HOSPITAL_COMMUNITY): Payer: Self-pay | Admitting: *Deleted

## 2018-11-07 DIAGNOSIS — N186 End stage renal disease: Secondary | ICD-10-CM | POA: Diagnosis not present

## 2018-11-07 DIAGNOSIS — M62838 Other muscle spasm: Secondary | ICD-10-CM | POA: Diagnosis not present

## 2018-11-07 DIAGNOSIS — Z7982 Long term (current) use of aspirin: Secondary | ICD-10-CM | POA: Insufficient documentation

## 2018-11-07 DIAGNOSIS — I120 Hypertensive chronic kidney disease with stage 5 chronic kidney disease or end stage renal disease: Secondary | ICD-10-CM | POA: Diagnosis not present

## 2018-11-07 DIAGNOSIS — Z79899 Other long term (current) drug therapy: Secondary | ICD-10-CM | POA: Diagnosis not present

## 2018-11-07 DIAGNOSIS — I2510 Atherosclerotic heart disease of native coronary artery without angina pectoris: Secondary | ICD-10-CM | POA: Insufficient documentation

## 2018-11-07 DIAGNOSIS — Z992 Dependence on renal dialysis: Secondary | ICD-10-CM | POA: Diagnosis not present

## 2018-11-07 DIAGNOSIS — Z794 Long term (current) use of insulin: Secondary | ICD-10-CM | POA: Insufficient documentation

## 2018-11-07 DIAGNOSIS — E1122 Type 2 diabetes mellitus with diabetic chronic kidney disease: Secondary | ICD-10-CM | POA: Insufficient documentation

## 2018-11-07 DIAGNOSIS — Z87891 Personal history of nicotine dependence: Secondary | ICD-10-CM | POA: Insufficient documentation

## 2018-11-07 LAB — COMPREHENSIVE METABOLIC PANEL
ALT: 18 U/L (ref 0–44)
AST: 21 U/L (ref 15–41)
Albumin: 3.4 g/dL — ABNORMAL LOW (ref 3.5–5.0)
Alkaline Phosphatase: 91 U/L (ref 38–126)
Anion gap: 12 (ref 5–15)
BUN: 30 mg/dL — AB (ref 8–23)
CHLORIDE: 97 mmol/L — AB (ref 98–111)
CO2: 28 mmol/L (ref 22–32)
CREATININE: 7.27 mg/dL — AB (ref 0.61–1.24)
Calcium: 9.4 mg/dL (ref 8.9–10.3)
GFR calc Af Amer: 8 mL/min — ABNORMAL LOW (ref >60)
GFR calc non Af Amer: 7 mL/min — ABNORMAL LOW (ref >60)
GLUCOSE: 123 mg/dL — AB (ref 70–99)
Potassium: 4.2 mmol/L (ref 3.5–5.1)
Sodium: 137 mmol/L (ref 135–145)
Total Bilirubin: 0.9 mg/dL (ref 0.3–1.2)
Total Protein: 7.4 g/dL (ref 6.5–8.1)

## 2018-11-07 LAB — CBC WITH DIFFERENTIAL/PLATELET
ABS IMMATURE GRANULOCYTES: 0.01 10*3/uL (ref 0.00–0.07)
BASOS PCT: 1 %
Basophils Absolute: 0.1 10*3/uL (ref 0.0–0.1)
Eosinophils Absolute: 0.3 10*3/uL (ref 0.0–0.5)
Eosinophils Relative: 4 %
HEMATOCRIT: 38.2 % — AB (ref 39.0–52.0)
Hemoglobin: 12.1 g/dL — ABNORMAL LOW (ref 13.0–17.0)
IMMATURE GRANULOCYTES: 0 %
LYMPHS ABS: 1.2 10*3/uL (ref 0.7–4.0)
Lymphocytes Relative: 15 %
MCH: 29.9 pg (ref 26.0–34.0)
MCHC: 31.7 g/dL (ref 30.0–36.0)
MCV: 94.3 fL (ref 80.0–100.0)
MONOS PCT: 10 %
Monocytes Absolute: 0.8 10*3/uL (ref 0.1–1.0)
NEUTROS ABS: 5.7 10*3/uL (ref 1.7–7.7)
NEUTROS PCT: 70 %
PLATELETS: 277 10*3/uL (ref 150–400)
RBC: 4.05 MIL/uL — ABNORMAL LOW (ref 4.22–5.81)
RDW: 16.2 % — ABNORMAL HIGH (ref 11.5–15.5)
WBC: 8.2 10*3/uL (ref 4.0–10.5)
nRBC: 0 % (ref 0.0–0.2)

## 2018-11-07 LAB — MAGNESIUM: MAGNESIUM: 2.1 mg/dL (ref 1.7–2.4)

## 2018-11-07 LAB — PHOSPHORUS: Phosphorus: 4.1 mg/dL (ref 2.5–4.6)

## 2018-11-07 MED ORDER — LAMOTRIGINE 150 MG PO TABS: 150.0000 mg | ORAL_TABLET | Freq: Every day | ORAL | 0 refills | Status: DC

## 2018-11-07 MED ORDER — LAMOTRIGINE 150 MG PO TABS
150.0000 mg | ORAL_TABLET | Freq: Once | ORAL | Status: AC
  Administered 2018-11-07: 150 mg via ORAL
  Filled 2018-11-07 (×2): qty 1

## 2018-11-07 MED ORDER — DIAZEPAM 5 MG PO TABS: 5.0000 mg | ORAL_TABLET | Freq: Four times a day (QID) | ORAL | 0 refills | Status: DC | PRN

## 2018-11-07 MED ORDER — DIAZEPAM 5 MG PO TABS
5.0000 mg | ORAL_TABLET | Freq: Once | ORAL | Status: AC
  Administered 2018-11-07: 5 mg via ORAL
  Filled 2018-11-07: qty 1

## 2018-11-07 NOTE — ED Triage Notes (Signed)
Pt in with possible withdrawal from a medication he takes for his PTSD, pt unsure of name of medication but sometimes when he has dialysis they take too much fluid off and take the medication out of his symptoms, pt c/o muscle spasms, denies other symptoms

## 2018-11-07 NOTE — Discharge Instructions (Signed)
Please begin taking your Lamictal again regularly as directed.  You may use Valium every 6 hours as needed for muscle spasms, this medication can cause drowsiness, do not take before driving.  Follow-up with your regular doctor and nephrologist if muscle spasms continue.

## 2018-11-07 NOTE — ED Provider Notes (Signed)
[facility] Provider Note   CSN: [id number] Arrival date & time: 11/07/18  3151     History   Chief Complaint Chief Complaint  Patient presents with  . Withdrawal    HPI Connor Hoover is a 73 y.o. male.  HPI   73 year old male with a past medical history including ESRD on hemodialysis T/Th/Sat, diabetes, CAD, hypertension and PTSD who presents to the emergency department for evaluation of muscle spasms.  Reports he began having muscle twitching and spasms last night.  Symptoms have been worsening throughout the day today.  Patient reports he has had these symptoms 3 times previously and thinks it is usually related to missing a dose of his Lamictal which she takes for PTSD.  He reports that he ran out of this medication and has missed the last 2-3 doses, usually takes 150 mg every afternoon.  Patient reports he is already contacted a doctor and has a new prescription in the mail on the way but it has not yet arrived.  He reports having random muscle spasms and twitching throughout his body.  He denies any seizure-like activity.  No associated pain, breathing problems, fever, nausea, vomiting.  Patient denies any recent illnesses.  He completed dialysis yesterday without any complications.  Has been eating and drinking well.  Was seen with very similar presentation in October 2019, treated with Valium with improvement in twitching and spasms.  Past Medical History:  Diagnosis Date  . Diabetes mellitus without complication ([place])   . Hypertension   . PTSD (post-traumatic stress disorder)     Patient Active Problem List   Diagnosis Date Noted  . DIABETES MELLITUS, TYPE II, ON INSULIN 11/30/2009  . HYPERLIPIDEMIA 11/30/2009  . OBESITY 11/30/2009  . HYPERTENSION 11/30/2009  . 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 CAD, NATIVE VESSEL 11/30/2009  . GERD 11/30/2009  . RENAL INSUFFICIENCY 11/30/2009  . TOBACCO USE,  QUIT 11/30/2009    Past Surgical History:  Procedure Laterality Date  . CARDIAC SURGERY          Home Medications    Prior to Admission medications   Medication Sig Start Date End Date Taking? Authorizing Provider  amitriptyline (ELAVIL) 25 MG tablet Take 25 mg by mouth daily.    Yes [provider]  aspirin 81 MG chewable tablet Chew 81 mg by mouth daily.   Yes [provider]  atorvastatin (LIPITOR) 80 MG tablet Take 80 mg by mouth at bedtime.   Yes [provider]  diltiazem (CARDIZEM) 30 MG tablet Take 30 mg by mouth every 8 (eight) hours.   Yes [provider]  insulin aspart (NOVOLOG) 100 UNIT/ML injection Inject 6 Units into the skin 3 (three) times daily before meals.   Yes [provider]  insulin glargine (LANTUS) 100 UNIT/ML injection Inject 26 Units into the skin at bedtime.   Yes [provider]  lamoTRIgine (LAMICTAL) 150 MG tablet Take 150 mg by mouth every morning.   Yes [provider]  Multiple Vitamin (RENAL MULTIVITAMIN/ZINC PO) Take 1 tablet by mouth daily with supper.   Yes [provider]  sertraline (ZOLOFT) 100 MG tablet Take 100 mg by mouth daily.   Yes [provider]  sevelamer carbonate (RENVELA) 800 MG tablet Take 800 mg by mouth See admin instructions. Take 800 mg by mouth three times a day with meals and 800 mg with one snack/limit of 5 tablets in 24 hours   Yes [provider]  CIALIS 10 MG tablet Take 10 mg by mouth daily as needed for erectile dysfunction.  06/06/15   [provider]  digoxin (LANOXIN) 0.125 MG tablet Take 0.0625 mg by mouth See admin instructions. Take 0.0625 mg by mouth once a day and hold if pulse is less than 60, then call MD    [provider]  HYDROcodone-acetaminophen (NORCO/VICODIN) 5-325 MG tablet Take 1-2 tablets by mouth every 6 (six) hours as needed for severe pain. Patient not taking: Reported on 08/23/2018 07/29/15    Piepenbrink, Anderson Malta, PA-C  ondansetron (ZOFRAN ODT) 4 MG disintegrating tablet Take 1 tablet (4 mg total) by mouth every 8 (eight) hours as needed for nausea or vomiting. Patient not taking: Reported on 08/23/2018 07/29/15   Piepenbrink, Anderson Malta, PA-C  polyethylene glycol powder (GLYCOLAX/MIRALAX) powder Take 1 capful once a day until daily soft stools  OTC Patient not taking: Reported on 08/23/2018 07/29/15   Baron Sane, PA-C    Family History History reviewed. No pertinent family history.  Social History Social History   Tobacco Use  . Smoking status: Former Research scientist (life sciences)  . Smokeless tobacco: Former Network engineer Use Topics  . Alcohol use: Not Currently  . Drug use: Not Currently     Allergies   Lisinopril and Povidine [povidone iodine]   Review of Systems Review of Systems  Constitutional: Negative for chills and fever.  Eyes: Negative for visual disturbance.  Respiratory: Negative for shortness of breath.   Cardiovascular: Negative for chest pain.  Gastrointestinal: Negative for abdominal pain, nausea and vomiting.  Musculoskeletal: Positive for myalgias. Negative for arthralgias, back pain, joint swelling and neck pain.  Skin: Negative for color change and rash.  Neurological: Positive for tremors. Negative for dizziness, seizures, syncope, facial asymmetry, speech difficulty, weakness, light-headedness, numbness and headaches.  All other systems reviewed and are negative.    Physical Exam Updated Vital Signs BP 131/83 (BP Location: Right Arm)   Pulse 84   Temp 98 F (36.7 C) (Oral)   Resp 20   SpO2 100%   Physical Exam Vitals signs and nursing note reviewed.  Constitutional:      General: He is not in acute distress.    Appearance: He is well-developed. He is not diaphoretic.  HENT:     Head: Normocephalic and atraumatic.  Eyes:     General:        Right eye: No discharge.        Left eye: No discharge.  Pulmonary:     Effort: Pulmonary  effort is normal. No respiratory distress.  Neurological:     Mental Status: He is alert and oriented to person, place, and time.     GCS: GCS eye subscore is 4. GCS verbal subscore is 5. GCS motor subscore is 6.     Motor: Abnormal muscle tone present.     Coordination: Coordination normal.     Comments: Fluent speech; occasional random quick spasms of varying extremities and abdominal wall during conversation; no sustained myoclonus; no clonus b/l feet; no hyperreflexia, normal strength x all 4 ext   Psychiatric:        Behavior: Behavior normal.      ED Treatments / Results  Labs (all labs ordered are listed, but only abnormal results are displayed) Labs Reviewed  CBC WITH DIFFERENTIAL/PLATELET - Abnormal; Notable for the following components:      Result Value   RBC 4.05 (*)    Hemoglobin 12.1 (*)    HCT  38.2 (*)    RDW 16.2 (*)    All other components within normal limits  COMPREHENSIVE METABOLIC PANEL - Abnormal; Notable for the following components:   Chloride 97 (*)    Glucose, Bld 123 (*)    BUN 30 (*)    Creatinine, Ser 7.27 (*)    Albumin 3.4 (*)    GFR calc non Af Amer 7 (*)    GFR calc Af Amer 8 (*)    All other components within normal limits  MAGNESIUM  PHOSPHORUS    EKG None  Radiology No results found.  Procedures Procedures (including critical care time)  Medications Ordered in ED Medications  lamoTRIgine (LAMICTAL) tablet 150 mg (150 mg Oral Given 11/07/18 1138)  diazepam (VALIUM) tablet 5 mg (5 mg Oral Given 11/07/18 1138)     Initial Impression / Assessment and Plan / ED Course  I have reviewed the triage vital signs and the nursing notes.  Pertinent labs & imaging results that were available during my care of the patient were reviewed by me and considered in my medical decision making (see chart for details).  73 year old male presents for evaluation of muscle spasms with concern that he is withdrawing from his Lamictal, similar symptoms in  the past when he has missed doses and has been out of his medication for the past 2 to 3 days.  On my exam he is having quick spasms with no sustained myoclonus, twitching is not rhythmic to suggest partial seizures.  Differential diagnosis includes Lamictal withdrawal, as well as electrolyte derangement especially given patient's dialysis.  Basic chemistry panel, magnesium and phosphorus checked and all electrolytes appear to be within normal limits, no leukocytosis and hemoglobin at baseline.  Discussed Lamictal withdrawal with pharmacy and since patient has only missed 2-3 doses they do not recommend an increased loading dose, and recommend beginning patient back on his 150 mg daily, patient given dose of Lamictal here as well as Valium which has resolved twitching and spasms.  Patient reports he is feeling much better.  Will provide patient with short prescription of Lamictal until [facility].  We will also provide a short course of Valium for muscle twitching as needed, discussed appropriate precautions with this medication.  Recommend patient follow-up with his primary care doctor and nephrologist if spasms continue.  Patient expresses understanding and agreement with plan.  Discharged home in good condition.  Case discussed with Dr. Dayna Barker who saw and evaluated patient as well and is in agreement with treatment plan.  Final Clinical Impressions(s) / ED Diagnoses   Final diagnoses:  Muscle spasm    ED Discharge Orders         Ordered    lamoTRIgine (LAMICTAL) 150 MG tablet  Daily     11/07/18 1222    diazepam (VALIUM) 5 MG tablet  Every 6 hours PRN     11/07/18 1222           Benedetto Goad [place], [place] 11/08/18 2094    Merrily Pew, MD 11/08/18 [phone]

## 2019-07-06 ENCOUNTER — Other Ambulatory Visit: Payer: Self-pay

## 2019-07-06 ENCOUNTER — Emergency Department (HOSPITAL_COMMUNITY)
Admission: EM | Admit: 2019-07-06 | Discharge: 2019-07-06 | Disposition: A | Discharge status: Home / Self Care | Payer: No Typology Code available for payment source | Attending: Emergency Medicine | Admitting: Emergency Medicine

## 2019-07-06 ENCOUNTER — Encounter (HOSPITAL_COMMUNITY): Payer: Self-pay | Admitting: Emergency Medicine

## 2019-07-06 DIAGNOSIS — E119 Type 2 diabetes mellitus without complications: Secondary | ICD-10-CM | POA: Diagnosis not present

## 2019-07-06 DIAGNOSIS — N186 End stage renal disease: Secondary | ICD-10-CM | POA: Diagnosis not present

## 2019-07-06 DIAGNOSIS — Z794 Long term (current) use of insulin: Secondary | ICD-10-CM | POA: Diagnosis not present

## 2019-07-06 DIAGNOSIS — Z87891 Personal history of nicotine dependence: Secondary | ICD-10-CM | POA: Diagnosis not present

## 2019-07-06 DIAGNOSIS — I2510 Atherosclerotic heart disease of native coronary artery without angina pectoris: Secondary | ICD-10-CM | POA: Insufficient documentation

## 2019-07-06 DIAGNOSIS — I120 Hypertensive chronic kidney disease with stage 5 chronic kidney disease or end stage renal disease: Secondary | ICD-10-CM | POA: Insufficient documentation

## 2019-07-06 DIAGNOSIS — M62838 Other muscle spasm: Secondary | ICD-10-CM | POA: Insufficient documentation

## 2019-07-06 DIAGNOSIS — Z992 Dependence on renal dialysis: Secondary | ICD-10-CM | POA: Insufficient documentation

## 2019-07-06 DIAGNOSIS — Z7982 Long term (current) use of aspirin: Secondary | ICD-10-CM | POA: Insufficient documentation

## 2019-07-06 DIAGNOSIS — Z79899 Other long term (current) drug therapy: Secondary | ICD-10-CM | POA: Insufficient documentation

## 2019-07-06 LAB — BASIC METABOLIC PANEL
Anion gap: 12 (ref 5–15)
BUN: 43 mg/dL — ABNORMAL HIGH (ref 8–23)
CO2: 28 mmol/L (ref 22–32)
Calcium: 9.3 mg/dL (ref 8.9–10.3)
Chloride: 99 mmol/L (ref 98–111)
Creatinine, Ser: 12.34 mg/dL — ABNORMAL HIGH (ref 0.61–1.24)
GFR calc Af Amer: 4 mL/min — ABNORMAL LOW (ref >60)
GFR calc non Af Amer: 4 mL/min — ABNORMAL LOW (ref >60)
Glucose, Bld: 125 mg/dL — ABNORMAL HIGH (ref 70–99)
Potassium: 4.3 mmol/L (ref 3.5–5.1)
Sodium: 139 mmol/L (ref 135–145)

## 2019-07-06 LAB — CBC
HCT: 36.6 % — ABNORMAL LOW (ref 39.0–52.0)
Hemoglobin: 11.7 g/dL — ABNORMAL LOW (ref 13.0–17.0)
MCH: 29.5 pg (ref 26.0–34.0)
MCHC: 32 g/dL (ref 30.0–36.0)
MCV: 92.4 fL (ref 80.0–100.0)
Platelets: 224 10*3/uL (ref 150–400)
RBC: 3.96 MIL/uL — ABNORMAL LOW (ref 4.22–5.81)
RDW: 14.2 % (ref 11.5–15.5)
WBC: 14.1 10*3/uL — ABNORMAL HIGH (ref 4.0–10.5)
nRBC: 0 % (ref 0.0–0.2)

## 2019-07-06 MED ORDER — DIAZEPAM 5 MG PO TABS: 5.0000 mg | ORAL_TABLET | Freq: Two times a day (BID) | ORAL | 0 refills | Status: DC | PRN

## 2019-07-06 MED ORDER — DIAZEPAM 5 MG PO TABS
5.0000 mg | ORAL_TABLET | Freq: Once | ORAL | Status: AC
  Administered 2019-07-06: 12:00:00 5 mg via ORAL
  Filled 2019-07-06: qty 1

## 2019-07-06 NOTE — ED Notes (Signed)
Pt verbalized understanding of d/c instructions and will follow up with the [facility]. VSS, NAD

## 2019-07-06 NOTE — ED Triage Notes (Signed)
Pt here for muscle spasms all over that started yesterday. Pt states this is his 3rd time being here for this issue.

## 2019-07-06 NOTE — ED Provider Notes (Signed)
[facility] EMERGENCY DEPARTMENT Provider Note   CSN: [id number] Arrival date & time: 07/06/19  [id number]     History   Chief Complaint Chief Complaint  Patient presents with  . Spasms    HPI Connor Hoover is a 73 y.o. male.     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  Patient has intermittent spasms in his arms, legs, abdomen.  He has been in the emergency department twice in the past for the same symptoms.  Before he related this to having run out of his Lamictal.  However, currently patient is compliant with Lamictal and has not missed any doses.  He denies any pain. Patient denies signs of stroke including: facial droop, slurred speech, aphasia, weakness/numbness in extremities, imbalance/trouble walking.  No persistent seizure-like activity.  No treatments prior to arrival.     Past Medical History:  Diagnosis Date  . Diabetes mellitus without complication ([place])   . Hypertension   . PTSD (post-traumatic stress disorder)     Patient Active Problem List   Diagnosis Date Noted  . DIABETES MELLITUS, TYPE II, ON INSULIN 11/30/2009  . HYPERLIPIDEMIA 11/30/2009  . OBESITY 11/30/2009  . HYPERTENSION 11/30/2009  . 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 CAD, NATIVE VESSEL 11/30/2009  . GERD 11/30/2009  . RENAL INSUFFICIENCY 11/30/2009  . TOBACCO USE, QUIT 11/30/2009    Past Surgical History:  Procedure Laterality Date  . CARDIAC SURGERY          Home Medications    Prior to Admission medications   Medication Sig Start Date End Date Taking? Authorizing Provider  amitriptyline (ELAVIL) 25 MG tablet Take 25 mg by mouth daily.     [provider]  aspirin 81 MG chewable tablet Chew 81 mg by mouth daily.    [provider]  atorvastatin (LIPITOR) 80  MG tablet Take 80 mg by mouth at bedtime.    [provider]  CIALIS 10 MG tablet Take 10 mg by mouth daily as needed for erectile dysfunction.  06/06/15   [provider]  diazepam (VALIUM) 5 MG tablet Take 1 tablet (5 mg total) by mouth every 6 (six) hours as needed for anxiety (spasms). 11/07/18   Jacqlyn Larsen, PA-C  digoxin (LANOXIN) 0.125 MG tablet Take 0.0625 mg by mouth See admin instructions. Take 0.0625 mg by mouth once a day and hold if pulse is less than 60, then call MD    [provider]  diltiazem (CARDIZEM) 30 MG tablet Take 30 mg by mouth every 8 (eight) hours.    [provider]  HYDROcodone-acetaminophen (NORCO/VICODIN) 5-325 MG tablet Take 1-2 tablets by mouth every 6 (six) hours as needed for severe pain. Patient not taking: Reported on 08/23/2018 07/29/15   Piepenbrink, Anderson Malta, PA-C  insulin aspart (NOVOLOG) 100 UNIT/ML injection Inject 6 Units into the skin 3 (three) times daily before meals.    [provider]  insulin glargine (LANTUS) 100 UNIT/ML injection Inject 26 Units into the skin at bedtime.    [provider]  lamoTRIgine (LAMICTAL) 150 MG tablet Take 150 mg by mouth every morning.    [provider]  lamoTRIgine (LAMICTAL) 150 MG tablet Take 1 tablet (150 mg total) by mouth daily. 11/07/18   Jacqlyn Larsen, PA-C  Multiple Vitamin (RENAL MULTIVITAMIN/ZINC PO) Take 1 tablet by mouth daily with  supper.    [provider]  ondansetron (ZOFRAN ODT) 4 MG disintegrating tablet Take 1 tablet (4 mg total) by mouth every 8 (eight) hours as needed for nausea or vomiting. Patient not taking: Reported on 08/23/2018 07/29/15   Piepenbrink, Anderson Malta, PA-C  polyethylene glycol powder (GLYCOLAX/MIRALAX) powder Take 1 capful once a day until daily soft stools  OTC Patient not taking: Reported on 08/23/2018 07/29/15   Piepenbrink, Anderson Malta, PA-C  sertraline (ZOLOFT) 100 MG tablet Take 100 mg by mouth daily.     [provider]  sevelamer carbonate (RENVELA) 800 MG tablet Take 800 mg by mouth See admin instructions. Take 800 mg by mouth three times a day with meals and 800 mg with one snack/limit of 5 tablets in 24 hours    [provider]    Family History No family history on file.  Social History Social History   Tobacco Use  . Smoking status: Former Research scientist (life sciences)  . Smokeless tobacco: Former Network engineer Use Topics  . Alcohol use: Not Currently  . Drug use: Not Currently     Allergies   Lisinopril and Povidine [povidone iodine]   Review of Systems Review of Systems  Constitutional: Negative for fever.  HENT: Negative for rhinorrhea and sore throat.   Eyes: Negative for redness.  Respiratory: Negative for cough.   Cardiovascular: Negative for chest pain.  Gastrointestinal: Negative for abdominal pain, diarrhea, nausea and vomiting.  Genitourinary: Negative for dysuria.  Musculoskeletal: Positive for myalgias.  Skin: Negative for rash.  Neurological: Negative for headaches.     Physical Exam Updated Vital Signs BP 113/60   Pulse 79   Temp 98.7 F (37.1 C) (Oral)   Resp 20   Ht 5\' 6"  (1.676 m)   Wt 110 kg   SpO2 99%   BMI 39.14 kg/m   Physical Exam Vitals signs and nursing note reviewed.  Constitutional:      Appearance: He is well-developed.  HENT:     Head: Normocephalic and atraumatic.  Eyes:     General:        Right eye: No discharge.        Left eye: No discharge.     Conjunctiva/sclera: Conjunctivae normal.  Neck:     Musculoskeletal: Normal range of motion and neck supple.  Cardiovascular:     Rate and Rhythm: Normal rate. Rhythm irregular.     Heart sounds: Normal heart sounds.  Pulmonary:     Effort: Pulmonary effort is normal.     Breath sounds: Normal breath sounds.  Abdominal:     Palpations: Abdomen is soft.     Tenderness: There is no abdominal tenderness.     Comments: Abd soft, non-tender, PD cath site is clean. No signs  of SBP.   Musculoskeletal:     Comments: Occasional mild random jerking of extremity.   Skin:    General: Skin is warm and dry.  Neurological:     Mental Status: He is alert.      ED Treatments / Results  Labs (all labs ordered are listed, but only abnormal results are displayed) Labs Reviewed  CBC - Abnormal; Notable for the following components:      Result Value   WBC 14.1 (*)    RBC 3.96 (*)    Hemoglobin 11.7 (*)    HCT 36.6 (*)    All other components within normal limits  BASIC METABOLIC PANEL - Abnormal; Notable for the following components:   Glucose, Bld 125 (*)  BUN 43 (*)    Creatinine, Ser 12.34 (*)    GFR calc non Af Amer 4 (*)    GFR calc Af Amer 4 (*)    All other components within normal limits    EKG EKG Interpretation  Date/Time:  Saturday July 06 2019 11:45:34 EDT Ventricular Rate:  81 PR Interval:    QRS Duration: 171 QT Interval:  464 QTC Calculation: 539 R Axis:   -65 Text Interpretation:  Sinus rhythm Atrial premature complex Borderline prolonged PR interval RBBB and LAFB Abnormal ECG Confirmed by Carmin Muskrat [phone]) on 07/06/2019 1:37:29 PM   Radiology No results found.  Procedures Procedures (including critical care time)  Medications Ordered in ED Medications  diazepam (VALIUM) tablet 5 mg (5 mg Oral Given 07/06/19 1133)     Initial Impression / Assessment and Plan / ED Course  I have reviewed the triage vital signs and the nursing notes.  Pertinent labs & imaging results that were available during my care of the patient were reviewed by me and considered in my medical decision making (see chart for details).        Patient seen and examined. Work-up initiated.  Given ESRD status, will need to check electrolytes and EKG.  Will give oral Valium as this has helped the patient in the past.  Vital signs reviewed and are as follows: BP 113/60   Pulse 79   Temp 98.7 F (37.1 C) (Oral)   Resp 20   Ht 5\' 6"  (1.676 m)    Wt 110 kg   SpO2 99%   BMI 39.14 kg/m   1:37 PM patient feels much better after oral Valium.  We discussed his elevated white blood cell count without signs of infection today.  Patient makes urine but denies any dysuria, or other irritative symptoms.  Denies any worsening cough or shortness of breath.  Patient discussed with and seen by Dr. Vanita Panda.  Agree no indication for further work-up.  Encourage PCP follow-up.  Encouraged follow-up with his dialysis clinic for continued evaluation of his electrolytes which were normal today.  Patient verbalizes understanding and agrees with plan.  He is eager to leave and go get lunch.   Final Clinical Impressions(s) / ED Diagnoses   Final diagnoses:  Muscle spasm   Patient with generalized, brief, diffuse muscle spasms and twitches.  Electrolytes checked and are normal.  Elevated white blood cell count noted without other obvious signs of infection today. EKG changed from 2011 without signs of ischemia. Seen in conjunction with attending MD.   ED Discharge Orders         Ordered    diazepam (VALIUM) 5 MG tablet  Every 12 hours PRN     07/06/19 1313           Carlisle Cater, PA-C 07/06/19 1340    Carmin Muskrat, MD 07/08/19 1416

## 2019-07-06 NOTE — ED Notes (Signed)
Got patient into a gown on the monitor patient is resting with call bell in reach and family at bedside ?

## 2019-07-06 NOTE — Discharge Instructions (Signed)
Please read and follow all provided instructions.  Your diagnoses today include:  1. Muscle spasm     Tests performed today include:  Blood counts - elevated white blood cell count but do not have any strong indications of infection today  Electrolytes - normal  Vital signs. See below for your results today.   Medications prescribed:   Valium - muscle relaxer medication  DO NOT drive or perform any activities that require you to be awake and alert because this medicine can make you drowsy.   Use medication only under direct supervision at the lowest possible dose needed to help prevent falls.   Take any prescribed medications only as directed.  Home care instructions:  Follow any educational materials contained in this packet.  BE VERY CAREFUL not to take multiple medicines containing Tylenol (also called acetaminophen). Doing so can lead to an overdose which can damage your liver and cause liver failure and possibly death.   Follow-up instructions: Please follow-up with your primary care provider in the next 3 days for further evaluation of your symptoms.   Return instructions:   Please return to the Emergency Department if you experience worsening symptoms.   Please return if you have any other emergent concerns.  Additional Information:  Your vital signs today were: BP 108/69    Pulse 79    Temp 98.7 F (37.1 C) (Oral)    Resp (!) 25    Ht 5\' 6"  (1.676 m)    Wt 110 kg    SpO2 99%    BMI 39.14 kg/m  If your blood pressure (BP) was elevated above 135/85 this visit, please have this repeated by your doctor within one month. --------------

## 2019-09-15 ENCOUNTER — Encounter (HOSPITAL_COMMUNITY): Payer: Self-pay | Admitting: *Deleted

## 2019-09-15 ENCOUNTER — Other Ambulatory Visit: Payer: Self-pay

## 2019-09-15 ENCOUNTER — Emergency Department (HOSPITAL_COMMUNITY)
Admission: EM | Admit: 2019-09-15 | Discharge: 2019-09-15 | Disposition: A | Discharge status: Home / Self Care | Payer: No Typology Code available for payment source | Attending: Emergency Medicine | Admitting: Emergency Medicine

## 2019-09-15 DIAGNOSIS — I2510 Atherosclerotic heart disease of native coronary artery without angina pectoris: Secondary | ICD-10-CM | POA: Insufficient documentation

## 2019-09-15 DIAGNOSIS — Z992 Dependence on renal dialysis: Secondary | ICD-10-CM | POA: Diagnosis not present

## 2019-09-15 DIAGNOSIS — E1122 Type 2 diabetes mellitus with diabetic chronic kidney disease: Secondary | ICD-10-CM | POA: Diagnosis not present

## 2019-09-15 DIAGNOSIS — Z794 Long term (current) use of insulin: Secondary | ICD-10-CM | POA: Diagnosis not present

## 2019-09-15 DIAGNOSIS — Z79899 Other long term (current) drug therapy: Secondary | ICD-10-CM | POA: Insufficient documentation

## 2019-09-15 DIAGNOSIS — N186 End stage renal disease: Secondary | ICD-10-CM | POA: Diagnosis not present

## 2019-09-15 DIAGNOSIS — Z7982 Long term (current) use of aspirin: Secondary | ICD-10-CM | POA: Insufficient documentation

## 2019-09-15 DIAGNOSIS — M79675 Pain in left toe(s): Secondary | ICD-10-CM | POA: Insufficient documentation

## 2019-09-15 DIAGNOSIS — I120 Hypertensive chronic kidney disease with stage 5 chronic kidney disease or end stage renal disease: Secondary | ICD-10-CM | POA: Insufficient documentation

## 2019-09-15 DIAGNOSIS — Z87891 Personal history of nicotine dependence: Secondary | ICD-10-CM | POA: Insufficient documentation

## 2019-09-15 MED ORDER — LIDOCAINE 4 % EX CREA: 1.0000 "application " | TOPICAL_CREAM | CUTANEOUS | 0 refills | Status: AC | PRN

## 2019-09-15 MED ORDER — LIDOCAINE 4 % EX CREA
TOPICAL_CREAM | Freq: Once | CUTANEOUS | Status: AC
  Administered 2019-09-15: 08:00:00 via TOPICAL
  Filled 2019-09-15: qty 5

## 2019-09-15 NOTE — ED Notes (Addendum)
bandage over left big toe removed. ointment from room wiped off. Pt not complaining of pain at this time butt stated it comes and goes.

## 2019-09-15 NOTE — ED Provider Notes (Signed)
[facility] EMERGENCY DEPARTMENT Provider Note   CSN: [id number] Arrival date & time: 09/15/19  0201     History   Chief Complaint Chief Complaint  Patient presents with  . Toe Pain    HPI Connor Hoover is a 73 y.o. male.     HPI   Connor Hoover is a 73 y.o. male, with a history of renal failure on peritoneal dialysis, DM, HTN, presenting to the ED with pain to left big toe beginning yesterday.  Pain is on the medial side of the nail and patient states he feels as though he has an ingrown toenail.  His wife applied a topical medication he states is made for ingrown toenails and this relieved the pain. Denies fever, swelling, change in color, drainage from the area, trauma, numbness.     Past Medical History:  Diagnosis Date  . Diabetes mellitus without complication ([place])   . Hypertension   . Peritoneal dialysis status ([place])   . PTSD (post-traumatic stress disorder)     Patient Active Problem List   Diagnosis Date Noted  . DIABETES MELLITUS, TYPE II, ON INSULIN 11/30/2009  . HYPERLIPIDEMIA 11/30/2009  . OBESITY 11/30/2009  . HYPERTENSION 11/30/2009  . 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 CAD, NATIVE VESSEL 11/30/2009  . GERD 11/30/2009  . RENAL INSUFFICIENCY 11/30/2009  . TOBACCO USE, QUIT 11/30/2009    Past Surgical History:  Procedure Laterality Date  . CARDIAC SURGERY          Home Medications    Prior to Admission medications   Medication Sig Start Date End Date Taking? Authorizing Provider  amitriptyline (ELAVIL) 25 MG tablet Take 25 mg by mouth daily.     [provider]  aspirin 81 MG chewable tablet Chew 81 mg by mouth daily.    [provider]  atorvastatin (LIPITOR) 80 MG tablet Take 80 mg by mouth at bedtime.    [provider]  CIALIS 10 MG tablet Take 10 mg by mouth daily as needed for erectile dysfunction.  06/06/15   [provider]  diazepam (VALIUM)  5 MG tablet Take 1 tablet (5 mg total) by mouth every 12 (twelve) hours as needed for muscle spasms. 07/06/19   Carlisle Cater, PA-C  digoxin (LANOXIN) 0.125 MG tablet Take 0.0625 mg by mouth See admin instructions. Take 0.0625 mg by mouth once a day and hold if pulse is less than 60, then call MD    [provider]  diltiazem (CARDIZEM) 30 MG tablet Take 30 mg by mouth every 8 (eight) hours.    [provider]  insulin aspart (NOVOLOG) 100 UNIT/ML injection Inject 6 Units into the skin 3 (three) times daily before meals.    [provider]  insulin glargine (LANTUS) 100 UNIT/ML injection Inject 26 Units into the skin at bedtime.    [provider]  lamoTRIgine (LAMICTAL) 150 MG tablet Take 150 mg by mouth every morning.    [provider]  lamoTRIgine (LAMICTAL) 150 MG tablet Take 1 tablet (150 mg total) by mouth daily. 11/07/18   Jacqlyn Larsen, PA-C  lidocaine (LMX) 4 % cream Apply 1 application topically as needed. 09/15/19   Danielle Mink C, PA-C  Multiple Vitamin (RENAL MULTIVITAMIN/ZINC PO) Take 1 tablet by mouth daily with supper.    [provider]  sertraline (ZOLOFT) 100 MG tablet Take 100 mg by mouth daily.    [provider]  sevelamer carbonate (RENVELA) 800 MG  tablet Take 800 mg by mouth See admin instructions. Take 800 mg by mouth three times a day with meals and 800 mg with one snack/limit of 5 tablets in 24 hours    [provider]    Family History History reviewed. No pertinent family history.  Social History Social History   Tobacco Use  . Smoking status: Former Research scientist (life sciences)  . Smokeless tobacco: Former Network engineer Use Topics  . Alcohol use: Not Currently  . Drug use: Not Currently     Allergies   Lisinopril and Povidine [povidone iodine]   Review of Systems Review of Systems  Musculoskeletal: Negative for joint swelling.  Skin: Negative for wound.       Left big toe pain at the nail   Neurological: Negative for weakness and numbness.     Physical Exam Updated Vital Signs BP (!) 121/95 (BP Location: Left Arm)   Pulse 85   Temp 98.1 F (36.7 C) (Oral)   Resp 18   Ht 5' 6.5" (1.689 m)   Wt 120.2 kg   SpO2 99%   BMI 42.13 kg/m   Physical Exam Vitals signs and nursing note reviewed.  Constitutional:      General: He is not in acute distress.    Appearance: He is well-developed. He is not diaphoretic.  HENT:     Head: Normocephalic and atraumatic.  Eyes:     Conjunctiva/sclera: Conjunctivae normal.  Neck:     Musculoskeletal: Neck supple.  Cardiovascular:     Rate and Rhythm: Normal rate and regular rhythm.  Pulmonary:     Effort: Pulmonary effort is normal.  Musculoskeletal:     Comments: Skin to the left big toe surrounding the nail appears to be intact.  No tenderness, swelling, drainage, fluctuance, erythema.  Nail appears to be stable and intact.  Skin:    General: Skin is warm and dry.     Capillary Refill: Capillary refill takes less than 2 seconds.     Coloration: Skin is not pale.  Neurological:     Mental Status: He is alert.     Comments: Sensation to light touch grossly intact in the left big toe. Appropriate motor function intact in the left big toe.  Psychiatric:        Behavior: Behavior normal.      ED Treatments / Results  Labs (all labs ordered are listed, but only abnormal results are displayed) Labs Reviewed - No data to display  EKG None  Radiology No results found.  Procedures Procedures (including critical care time)  Medications Ordered in ED Medications  lidocaine (LMX) 4 % cream (has no administration in time range)     Initial Impression / Assessment and Plan / ED Course  I have reviewed the triage vital signs and the nursing notes.  Pertinent labs & imaging results that were available during my care of the patient were reviewed by me and considered in my medical decision making (see chart for details).         Patient presents with pain around the nail of the left big toe.  The edges of the toenail are appropriately mobile and the skin appears to be intact.  Patient's symptoms was weighed against lack of findings consistent with ingrown toenail on exam and risk factors for poor outcome from toenail removal.  We decided upon a plan of care that included topical analgesia and podiatry referral. The patient was given instructions for home care as well as return precautions.  Patient voices understanding of these instructions, accepts the plan, and is comfortable with discharge.   Findings and plan of care discussed with Madalyn Rob, MD. Dr. Roslynn Amble personally evaluated and examined this patient.  Final Clinical Impressions(s) / ED Diagnoses   Final diagnoses:  Pain of left great toe    ED Discharge Orders         Ordered    Ambulatory referral to Podiatry     09/15/19 0738    lidocaine (LMX) 4 % cream  As needed     09/15/19 0743           Lorayne Bender, PA-C 09/15/19 0802    Lucrezia Starch, MD 09/17/19 2321

## 2019-09-15 NOTE — Discharge Instructions (Addendum)
Please follow-up with podiatry on this matter.  This contact can be quite helpful with not just this issue, but also with regular assessment of your feet in light of your diabetes and dialysis history.  Apply lidocaine cream, as needed, for pain.

## 2019-09-15 NOTE — ED Notes (Signed)
Connor Hoover [phone] wants call when she can come back

## 2019-09-15 NOTE — ED Notes (Signed)
Pt discharge instructions reviewed with the patient. Pt verbalized understanding of instructions and medications. Pt discharged.

## 2019-09-15 NOTE — ED Triage Notes (Signed)
The pt  Is c.o an ingrown lt great toenail for 2 days  The pt reports much pain

## 2019-11-04 ENCOUNTER — Encounter (HOSPITAL_COMMUNITY): Payer: Self-pay | Admitting: Internal Medicine

## 2019-11-04 ENCOUNTER — Ambulatory Visit (HOSPITAL_COMMUNITY)
Admission: RE | Admit: 2019-11-04 | Discharge: 2019-11-04 | Disposition: A | Discharge status: Home / Self Care | Payer: No Typology Code available for payment source | Source: Ambulatory Visit | Attending: Internal Medicine | Admitting: Internal Medicine

## 2019-11-04 ENCOUNTER — Other Ambulatory Visit: Payer: Self-pay

## 2019-11-04 VITALS — BP 134/76 | HR 91 | Ht 66.5 in | Wt 266.4 lb

## 2019-11-04 DIAGNOSIS — Z87891 Personal history of nicotine dependence: Secondary | ICD-10-CM | POA: Insufficient documentation

## 2019-11-04 DIAGNOSIS — I5022 Chronic systolic (congestive) heart failure: Secondary | ICD-10-CM | POA: Insufficient documentation

## 2019-11-04 DIAGNOSIS — I2510 Atherosclerotic heart disease of native coronary artery without angina pectoris: Secondary | ICD-10-CM | POA: Diagnosis not present

## 2019-11-04 DIAGNOSIS — Z6841 Body Mass Index (BMI) 40.0 and over, adult: Secondary | ICD-10-CM | POA: Diagnosis not present

## 2019-11-04 DIAGNOSIS — Z992 Dependence on renal dialysis: Secondary | ICD-10-CM | POA: Diagnosis not present

## 2019-11-04 DIAGNOSIS — I255 Ischemic cardiomyopathy: Secondary | ICD-10-CM | POA: Diagnosis not present

## 2019-11-04 DIAGNOSIS — Z7982 Long term (current) use of aspirin: Secondary | ICD-10-CM | POA: Diagnosis not present

## 2019-11-04 DIAGNOSIS — F4310 Post-traumatic stress disorder, unspecified: Secondary | ICD-10-CM | POA: Diagnosis not present

## 2019-11-04 DIAGNOSIS — E669 Obesity, unspecified: Secondary | ICD-10-CM | POA: Diagnosis not present

## 2019-11-04 DIAGNOSIS — Z888 Allergy status to other drugs, medicaments and biological substances status: Secondary | ICD-10-CM | POA: Diagnosis not present

## 2019-11-04 DIAGNOSIS — I132 Hypertensive heart and chronic kidney disease with heart failure and with stage 5 chronic kidney disease, or end stage renal disease: Secondary | ICD-10-CM | POA: Diagnosis not present

## 2019-11-04 DIAGNOSIS — I252 Old myocardial infarction: Secondary | ICD-10-CM | POA: Diagnosis not present

## 2019-11-04 DIAGNOSIS — Z794 Long term (current) use of insulin: Secondary | ICD-10-CM | POA: Diagnosis not present

## 2019-11-04 DIAGNOSIS — I4510 Unspecified right bundle-branch block: Secondary | ICD-10-CM | POA: Insufficient documentation

## 2019-11-04 DIAGNOSIS — Z79899 Other long term (current) drug therapy: Secondary | ICD-10-CM | POA: Diagnosis not present

## 2019-11-04 DIAGNOSIS — Z951 Presence of aortocoronary bypass graft: Secondary | ICD-10-CM | POA: Insufficient documentation

## 2019-11-04 DIAGNOSIS — R0600 Dyspnea, unspecified: Secondary | ICD-10-CM | POA: Insufficient documentation

## 2019-11-04 DIAGNOSIS — N186 End stage renal disease: Secondary | ICD-10-CM | POA: Diagnosis not present

## 2019-11-04 DIAGNOSIS — E1122 Type 2 diabetes mellitus with diabetic chronic kidney disease: Secondary | ICD-10-CM | POA: Diagnosis not present

## 2019-11-04 DIAGNOSIS — I5020 Unspecified systolic (congestive) heart failure: Secondary | ICD-10-CM | POA: Diagnosis present

## 2019-11-04 NOTE — Progress Notes (Addendum)
ADVANCED HF CLINIC CONSULT NOTE  Referring Physician: Dr. Joelyn Oms Primary Care: Primary Cardiologist: [facility]  HPI:  Connor Hoover is a 74 y/o male with h/o obesity, HTN. 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 Had emergent PCI of totally-occluded RCA. Then had staged PCI of m LAD. Subsequently CABG at [facility] in 2017. In 2018 had another MI and underwent re-do CABG at [place]. Developed shock requiring ECMO and says when he woke he was on dialysis.   Follows with Cardiology at [facility]. Last seen in 8/20.   Admitted in 12/20 with SOB felt to be related to insufficient PD. CT scan no PE. Echo EF 35% (stable) Nuclear scan with multiple fixed defects. No ischemia.   Denies CP or edema. Main complaint is severe DOE. Can walk slowly around the house but can't do anything else. + orthopnea or PND. Says he is at his dry weight. SBP usually ~ 110 but can drop into 90s.   Says he hasn't worn his CPAP for years.    Quit smoking 20 years ago. Smoked 2ppd for some time    Review of Systems: [y] = yes, [ ]  = no   General: Weight gain [ ] ; Weight loss [ ] ; Anorexia [ ] ; Fatigue Blue.Reese ]; Fever [ ] ; Chills [ ] ; Weakness [ ]   Cardiac: Chest pain/pressure [ ] ; Resting SOB [ ] ; Exertional SOB [ y]; Orthopnea y[ ] ; Pedal Edema [ ] ; Palpitations [ ] ; Syncope [ ] ; Presyncope [ ] ; Paroxysmal nocturnal dyspnea[ ]   Pulmonary: Cough [ ] ; Wheezing[ ] ; Hemoptysis[ ] ; Sputum [ ] ; Snoring [ y]  GI: Vomiting[ ] ; Dysphagia[ ] ; Melena[ ] ; Hematochezia [ ] ; Heartburn[ ] ; Abdominal pain [ ] ; Constipation [ ] ; Diarrhea [ ] ; BRBPR [ ]   GU: Hematuria[ ] ; Dysuria [ ] ; Nocturia[ ]   Vascular: Pain in legs with walking [ ] ; Pain in feet with lying flat [ ] ; Non-healing sores [ ] ; Stroke [ ] ; TIA [ ] ; Slurred speech [ ] ;  Neuro: Headaches[ ] ; Vertigo[ ] ; Seizures[ ] ; Paresthesias[ ] ;Blurred vision [ ] ; Diplopia [ ] ; Vision changes [ ]   Ortho/Skin: Arthritis Blue.Reese ]; Joint  pain Blue.Reese ]; Muscle pain [ ] ; Joint swelling [ ] ; Back Pain Blue.Reese ]; Rash [ ]   Psych: Depression[ ] ; Anxiety[ ]   Heme: Bleeding problems [ ] ; Clotting disorders [ ] ; Anemia Blue.Reese ]  Endocrine: Diabetes Blue.Reese ]; Thyroid dysfunction[ ]    Past Medical History:  Diagnosis Date  . Diabetes mellitus without complication ([place])   . Hypertension   . Peritoneal dialysis status ([place])   . PTSD (post-traumatic stress disorder)     Current Outpatient Medications  Medication Sig Dispense Refill  . aspirin 81 MG chewable tablet Chew 81 mg by mouth daily.    Marland Kitchen atorvastatin (LIPITOR) 80 MG tablet Take 80 mg by mouth at bedtime.    . carvedilol (COREG) 3.125 MG tablet Take 3.125 mg by mouth 2 (two) times daily with a meal.    . CIALIS 10 MG tablet Take 10 mg by mouth daily as needed for erectile dysfunction.   5  . diazepam (VALIUM) 5 MG tablet Take 1 tablet (5 mg total) by mouth every 12 (twelve) hours as needed for muscle spasms. 6 tablet 0  . insulin aspart (NOVOLOG) 100 UNIT/ML injection Inject 6 Units into the skin 3 (three) times daily before meals.    . insulin glargine (LANTUS) 100  UNIT/ML injection Inject 26 Units into the skin at bedtime.    . lidocaine (LMX) 4 % cream Apply 1 application topically as needed. 30 g 0  . loratadine (CLARITIN) 10 MG tablet Take 10 mg by mouth daily.    . Multiple Vitamin (RENAL MULTIVITAMIN/ZINC PO) Take 1 tablet by mouth daily with supper.    . sertraline (ZOLOFT) 100 MG tablet Take 100 mg by mouth daily.    . sevelamer carbonate (RENVELA) 800 MG tablet Take 1,600 mg by mouth 3 (three) times daily with meals.     No current facility-administered medications for this encounter.    Allergies  Allergen Reactions  . Benadryl [Diphenhydramine] Swelling  . Lisinopril Swelling    Made the tongue swell and resulted in a trip to the ER  . Povidine [Povidone Iodine] Itching      Social History   Socioeconomic History  . Marital status: Legally Separated    Spouse name:  Not on file  . Number of children: Not on file  . Years of education: Not on file  . Highest education level: Not on file  Occupational History  . Not on file  Tobacco Use  . Smoking status: Former Research scientist (life sciences)  . Smokeless tobacco: Former Network engineer and Sexual Activity  . Alcohol use: Not Currently  . Drug use: Not Currently  . Sexual activity: Not on file  Other Topics Concern  . Not on file  Social History Narrative  . Not on file   Social Determinants of Health   Financial Resource Strain:   . Difficulty of Paying Living Expenses: Not on file  Food Insecurity:   . Worried About Charity fundraiser in the Last Year: Not on file  . Ran Out of Food in the Last Year: Not on file  Transportation Needs:   . Lack of Transportation (Medical): Not on file  . Lack of Transportation (Non-Medical): Not on file  Physical Activity:   . Days of Exercise per Week: Not on file  . Minutes of Exercise per Session: Not on file  Stress:   . Feeling of Stress : Not on file  Social Connections:   . Frequency of Communication with Friends and Family: Not on file  . Frequency of Social Gatherings with Friends and Family: Not on file  . Attends Religious Services: Not on file  . Active Member of Clubs or Organizations: Not on file  . Attends Archivist Meetings: Not on file  . Marital Status: Not on file  Intimate Partner Violence:   . Fear of Current or Ex-Partner: Not on file  . Emotionally Abused: Not on file  . Physically Abused: Not on file  . Sexually Abused: Not on file     No family history on file.  Vitals:   11/04/19 0947  BP: 134/76  Pulse: 91  SpO2: 96%  Weight: 120.8 kg (266 lb 6.4 oz)  Height: 5' 6.5" (1.689 m)    PHYSICAL EXAM: General:  Obese male. Walks clinic slowly with no respiratory difficulty. No respiratory difficulty HEENT: normal Neck: supple. no JVD. Carotids 2+ bilat; no bruits. No lymphadenopathy or thryomegaly appreciated. Cor: PMI  nondisplaced. Regular rate & rhythm. No rubs, gallops or murmurs. Lungs: clear Abdomen: obese soft, nontender, nondistended. No hepatosplenomegaly. No bruits or masses. Good bowel sounds. + PD cath Extremities: no cyanosis, clubbing, rash, edema Neuro: alert & oriented x 3, cranial nerves grossly intact. moves all 4 extremities w/o difficulty. Affect pleasant.  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 Personally reviewed   ASSESSMENT & PLAN:  1. Dyspnea - suspect mixed etiology of mild volume overload, deconditioning and lung disease/untreated OSA - volume status mildly elevated (REDs 41% - normal 20-35% lung water) in setting of not having PD overnight (suspect not too bad at baseline) - ambulatory sats 96-99% - Consider PFTs and sleep study at [facility] - Refer cardiac rehab - Can consider RHC to further evaluate filling pressures and determine need to drop weight some  2. 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 CAD - s/p re-do CABg in 2018 c/b shock requiring ECMO - Nuclear study 12/20 [facility] +scar no ischemia - currently no s/s concerning for iscehmia  4. ESRD  - on PD  Overall, I think main component here is deconditioning. May have some benefit to doing RHC to see if we can improve symptoms by dropping dry weight but I doubt that he has much extra fluid here. Consider home PT vs cardiac rehab. Can check PFTs and sleep study as well.   [name], MD  11:39 AM

## 2019-11-04 NOTE — Progress Notes (Signed)
ReDS Vest / Clip - 11/04/19 1100      ReDS Vest / Clip   Station Marker  D    Ruler Value  38    ReDS Value Range  (!) High volume overload    ReDS Actual Value  41

## 2019-11-04 NOTE — Patient Instructions (Signed)
It was great to see you today! No medication changes are needed at this time.  Your physician recommends that you schedule a follow-up appointment in: 6 months with Dr Haroldine Laws  Do the following things EVERYDAY: 1) Weigh yourself in the morning before breakfast. Write it down and keep it in a log. 2) Take your medicines as prescribed 3) Eat low salt foods--Limit salt (sodium) to 2000 mg per day.  4) Stay as active as you can everyday 5) Limit all fluids for the day to less than 2 liters  At the [facility], you and your health needs are our priority. As part of our continuing mission to provide you with exceptional heart care, we have created designated Provider Care Teams. These Care Teams include your primary Cardiologist (physician) and Advanced Practice Providers (APPs- Physician Assistants and Nurse Practitioners) who all work together to provide you with the care you need, when you need it.   You may see any of the following providers on your designated Care Team at your next follow up: Marland Kitchen Dr [name] . Dr Loralie Champagne . Darrick Grinder, NP . Lyda Jester, PA . Audry Riles, PharmD   Please be sure to bring in all your medications bottles to every appointment.

## 2019-11-25 ENCOUNTER — Encounter (HOSPITAL_COMMUNITY): Payer: Self-pay | Admitting: *Deleted

## 2019-11-25 NOTE — Progress Notes (Signed)
Received VA authorization - [id number] from Macie Burows NP for this pt to participate in Cardiac rehab with the diagnosis of Chronic Systolic Heart Failure.  Community care coordinator at the [facility] aware that presently we have paused our onsite Cardiac rehab program as staff have been deployed to other areas within the hospital and Community to support needs and provide resources for the Covid pandemic.  Reviewed provided office notes including cardiology clearance for pt to have AVF fistula placed for HD verses peritoneal dialysis.  This is scheduled for 11/25/19. Pt seen in follow up from his hospitalizations with the cardiology clinic on 11/07/19 Dr. Lahoma Rocker.  Pr referred to EP for ICD implantation - low EF.  Pt seen by EP on 11/14/19.  Reviewed office note from Dr. Teena Dunk team  and pt is scheduled for determination for SubQ placement for his ICD due to his increase risk of infection. Pt and wife are both agreeable to this.  Pt seen locally by Dr. Tempie Hoist for establishing care in the Advance heart failure clinic here at [facility].  Reviewed his office note. Dr. Haroldine Laws suggest home PT verses cardiac rehab due to our scheduling barriers.will need additional follow up app as they are completed along with 12 lead EKG.   Will continue to monitor pt progress post AVF and ICD placement for readiness to proceed with scheduling onsite CR when we are permitted by senior leadership. Cherre Huger, BSN Cardiac and Training and development officer

## 2020-01-06 ENCOUNTER — Telehealth (HOSPITAL_COMMUNITY): Payer: Self-pay | Admitting: *Deleted

## 2020-01-06 NOTE — Telephone Encounter (Signed)
In response to pt going to heart and vascular heart failure clinic complaining about not being able to schedule for cardiac rehab, pt called and wife was also on speaker.  After receiving authorization from the [place] for this pt to participate in cardiac rehab a review of the accompanying documents.  Pt with several medical issues needed AVF to switch to HD, needed ICD for low EF% and pt seen by Bensimhon who recommended PT verses Cardiac rehab. Explained this in great detail to both pt and his wife reasons for holding on scheduling.  Pt has received his fistula not matured so using subclavian access.  Pt does dialysis on T, Th Sat at [address] location pt does not know who is his nephrologist.  Pt declined having ICD placed.  Advised pt that I would need additional information from the [place] offering clearance for him to proceed with scheduling.  Called and spoke to community care coordinator at the [facility] who will do some research to determine if pt is now medically stable to proceed with scheduling. Both pt and wife verbalized understanding. Cherre Huger, BSN Cardiac and Training and development officer

## 2020-01-16 ENCOUNTER — Emergency Department (HOSPITAL_COMMUNITY): Payer: No Typology Code available for payment source

## 2020-01-16 ENCOUNTER — Other Ambulatory Visit: Payer: Self-pay

## 2020-01-16 ENCOUNTER — Emergency Department (HOSPITAL_COMMUNITY)
Admission: EM | Admit: 2020-01-16 | Discharge: 2020-01-16 | Disposition: A | Discharge status: Home / Self Care | Payer: No Typology Code available for payment source | Attending: Emergency Medicine | Admitting: Emergency Medicine

## 2020-01-16 DIAGNOSIS — Z7982 Long term (current) use of aspirin: Secondary | ICD-10-CM | POA: Insufficient documentation

## 2020-01-16 DIAGNOSIS — Z992 Dependence on renal dialysis: Secondary | ICD-10-CM | POA: Diagnosis not present

## 2020-01-16 DIAGNOSIS — Z794 Long term (current) use of insulin: Secondary | ICD-10-CM | POA: Diagnosis not present

## 2020-01-16 DIAGNOSIS — N186 End stage renal disease: Secondary | ICD-10-CM

## 2020-01-16 DIAGNOSIS — I2510 Atherosclerotic heart disease of native coronary artery without angina pectoris: Secondary | ICD-10-CM | POA: Diagnosis not present

## 2020-01-16 DIAGNOSIS — E1122 Type 2 diabetes mellitus with diabetic chronic kidney disease: Secondary | ICD-10-CM | POA: Insufficient documentation

## 2020-01-16 DIAGNOSIS — N3000 Acute cystitis without hematuria: Secondary | ICD-10-CM | POA: Diagnosis not present

## 2020-01-16 DIAGNOSIS — Z79899 Other long term (current) drug therapy: Secondary | ICD-10-CM | POA: Diagnosis not present

## 2020-01-16 DIAGNOSIS — I120 Hypertensive chronic kidney disease with stage 5 chronic kidney disease or end stage renal disease: Secondary | ICD-10-CM | POA: Diagnosis not present

## 2020-01-16 DIAGNOSIS — R339 Retention of urine, unspecified: Secondary | ICD-10-CM | POA: Diagnosis present

## 2020-01-16 LAB — URINALYSIS, ROUTINE W REFLEX MICROSCOPIC
Bilirubin Urine: NEGATIVE
Glucose, UA: 50 mg/dL — AB
Ketones, ur: NEGATIVE mg/dL
Leukocytes,Ua: NEGATIVE
Nitrite: NEGATIVE
Protein, ur: 100 mg/dL — AB
Specific Gravity, Urine: 1.013 (ref 1.005–1.030)
pH: 8 (ref 5.0–8.0)

## 2020-01-16 LAB — CBC WITH DIFFERENTIAL/PLATELET
Abs Immature Granulocytes: 0.08 10*3/uL — ABNORMAL HIGH (ref 0.00–0.07)
Basophils Absolute: 0.1 10*3/uL (ref 0.0–0.1)
Basophils Relative: 1 %
Eosinophils Absolute: 0.4 10*3/uL (ref 0.0–0.5)
Eosinophils Relative: 3 %
HCT: 32.6 % — ABNORMAL LOW (ref 39.0–52.0)
Hemoglobin: 10 g/dL — ABNORMAL LOW (ref 13.0–17.0)
Immature Granulocytes: 1 %
Lymphocytes Relative: 11 %
Lymphs Abs: 1.2 10*3/uL (ref 0.7–4.0)
MCH: 31.5 pg (ref 26.0–34.0)
MCHC: 30.7 g/dL (ref 30.0–36.0)
MCV: 102.8 fL — ABNORMAL HIGH (ref 80.0–100.0)
Monocytes Absolute: 0.8 10*3/uL (ref 0.1–1.0)
Monocytes Relative: 7 %
Neutro Abs: 8.1 10*3/uL — ABNORMAL HIGH (ref 1.7–7.7)
Neutrophils Relative %: 77 %
Platelets: 221 10*3/uL (ref 150–400)
RBC: 3.17 MIL/uL — ABNORMAL LOW (ref 4.22–5.81)
RDW: 16.9 % — ABNORMAL HIGH (ref 11.5–15.5)
WBC: 10.6 10*3/uL — ABNORMAL HIGH (ref 4.0–10.5)
nRBC: 0 % (ref 0.0–0.2)

## 2020-01-16 LAB — BASIC METABOLIC PANEL
Anion gap: 18 — ABNORMAL HIGH (ref 5–15)
BUN: 65 mg/dL — ABNORMAL HIGH (ref 8–23)
CO2: 23 mmol/L (ref 22–32)
Calcium: 9.2 mg/dL (ref 8.9–10.3)
Chloride: 94 mmol/L — ABNORMAL LOW (ref 98–111)
Creatinine, Ser: 9.5 mg/dL — ABNORMAL HIGH (ref 0.61–1.24)
GFR calc Af Amer: 6 mL/min — ABNORMAL LOW (ref >60)
GFR calc non Af Amer: 5 mL/min — ABNORMAL LOW (ref >60)
Glucose, Bld: 227 mg/dL — ABNORMAL HIGH (ref 70–99)
Potassium: 3.9 mmol/L (ref 3.5–5.1)
Sodium: 135 mmol/L (ref 135–145)

## 2020-01-16 MED ORDER — SODIUM CHLORIDE 0.9 % IV SOLN
1.0000 g | Freq: Once | INTRAVENOUS | Status: AC
  Administered 2020-01-16: 16:00:00 1 g via INTRAVENOUS
  Filled 2020-01-16: qty 10

## 2020-01-16 MED ORDER — LIDOCAINE HCL URETHRAL/MUCOSAL 2 % EX GEL
1.0000 "application " | Freq: Once | CUTANEOUS | Status: AC
  Administered 2020-01-16: 1 via TOPICAL
  Filled 2020-01-16: qty 20

## 2020-01-16 MED ORDER — CEPHALEXIN 500 MG PO CAPS: 500.0000 mg | ORAL_CAPSULE | Freq: Two times a day (BID) | ORAL | 0 refills | Status: AC

## 2020-01-16 NOTE — Discharge Instructions (Addendum)
Begin taking Keflex as prescribed today.  We will call you if your urine culture indicates we need to initiate alternate therapy.  Follow-up with your dialysis session on Saturday, and return to the ER if symptoms significantly worsen or change.

## 2020-01-16 NOTE — ED Notes (Signed)
ED provider at bedside.

## 2020-01-16 NOTE — ED Notes (Signed)
Foley  Ordered for urinary  Retention.

## 2020-01-16 NOTE — ED Provider Notes (Addendum)
[facility] EMERGENCY DEPARTMENT Provider Note   CSN: [id number] Arrival date & time: 01/16/20  1032     History Chief Complaint  Patient presents with  . unable to urinate/pain    Connor Hoover is a 74 y.o. male.  Patient is a 74 year old male with past medical history of diabetes, hypertension, and end-stage renal disease for which she is on hemodialysis.  Patient presents today with complaints of difficulty urinating.  He feels pressure in his lower abdomen and feels as though he has to void, however is unable to expel urine from his penis.  He called his [facility] who told him to come here to be checked.  He denies fevers or chills.  Patient is due to be dialyzed today, but came here due to the above symptoms.  He denies any history of prostate issues  The history is provided by the patient.       Past Medical History:  Diagnosis Date  . Diabetes mellitus without complication ([place])   . Hypertension   . Peritoneal dialysis status ([place])   . PTSD (post-traumatic stress disorder)     Patient Active Problem List   Diagnosis Date Noted  . DIABETES MELLITUS, TYPE II, ON INSULIN 11/30/2009  . HYPERLIPIDEMIA 11/30/2009  . OBESITY 11/30/2009  . HYPERTENSION 11/30/2009  . 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 CAD, NATIVE VESSEL 11/30/2009  . GERD 11/30/2009  . RENAL INSUFFICIENCY 11/30/2009  . TOBACCO USE, QUIT 11/30/2009    Past Surgical History:  Procedure Laterality Date  . CARDIAC SURGERY         No family history on file.  Social History   Tobacco Use  . Smoking status: Former Research scientist (life sciences)  . Smokeless tobacco: Former Network engineer Use Topics  . Alcohol use: Not Currently  . Drug use: Not Currently    Home Medications Prior to Admission medications   Medication Sig Start Date End Date Taking? Authorizing Provider  aspirin 81 MG chewable tablet Chew 81 mg by mouth daily.    [provider]    atorvastatin (LIPITOR) 80 MG tablet Take 80 mg by mouth at bedtime.    [provider]  carvedilol (COREG) 3.125 MG tablet Take 3.125 mg by mouth 2 (two) times daily with a meal.    [provider]  CIALIS 10 MG tablet Take 10 mg by mouth daily as needed for erectile dysfunction.  06/06/15   [provider]  diazepam (VALIUM) 5 MG tablet Take 1 tablet (5 mg total) by mouth every 12 (twelve) hours as needed for muscle spasms. 07/06/19   Carlisle Cater, PA-C  insulin aspart (NOVOLOG) 100 UNIT/ML injection Inject 6 Units into the skin 3 (three) times daily before meals.    [provider]  insulin glargine (LANTUS) 100 UNIT/ML injection Inject 26 Units into the skin at bedtime.    [provider]  lidocaine (LMX) 4 % cream Apply 1 application topically as needed. 09/15/19   Joy, Shawn C, PA-C  loratadine (CLARITIN) 10 MG tablet Take 10 mg by mouth daily.    [provider]  Multiple Vitamin (RENAL MULTIVITAMIN/ZINC PO) Take 1 tablet by mouth daily with supper.    [provider]  sertraline (ZOLOFT) 100 MG tablet Take 100 mg by mouth daily.    [provider]  sevelamer carbonate (RENVELA) 800 MG tablet Take 1,600 mg by mouth 3 (three) times daily with meals.    [provider]  Allergies    Lisinopril and Povidine [povidone iodine]  Review of Systems   Review of Systems  All other systems reviewed and are negative.   Physical Exam Updated Vital Signs BP 117/67 (BP Location: Right Arm)   Temp 98.1 F (36.7 C) (Oral)   Ht 5' 6.5" (1.689 m)   Wt 120.2 kg   BMI 42.13 kg/m   Physical Exam Vitals and nursing note reviewed.  Constitutional:      General: He is not in acute distress.    Appearance: He is well-developed. He is not diaphoretic.  HENT:     Head: Normocephalic and atraumatic.  Cardiovascular:     Rate and Rhythm: Normal rate and regular rhythm.     Heart sounds: No murmur. No friction rub.   Pulmonary:     Effort: Pulmonary effort is normal. No respiratory distress.     Breath sounds: Normal breath sounds. No wheezing or rales.  Abdominal:     General: Bowel sounds are normal. There is no distension.     Palpations: Abdomen is soft.     Tenderness: There is no abdominal tenderness.  Musculoskeletal:        General: Normal range of motion.     Cervical back: Normal range of motion and neck supple.  Skin:    General: Skin is warm and dry.  Neurological:     Mental Status: He is alert and oriented to person, place, and time.     Coordination: Coordination normal.     ED Results / Procedures / Treatments   Labs (all labs ordered are listed, but only abnormal results are displayed) Labs Reviewed - No data to display  EKG None  Radiology No results found.  Procedures Procedures (including critical care time)  Medications Ordered in ED Medications  lidocaine (XYLOCAINE) 2 % jelly 1 application (has no administration in time range)    ED Course  I have reviewed the triage vital signs and the nursing notes.  Pertinent labs & imaging results that were available during my care of the patient were reviewed by me and considered in my medical decision making (see chart for details).    MDM Rules/Calculators/A&P  Patient is a 74 year old male with history of end-stage renal disease on hemodialysis, diabetes, hypertension.  He presents today for evaluation of dysuria and feeling as if he cannot void.  Patient is a dialysis patient, however does make some urine on occasion.  Patient is unable to void, but bladder scan reveals no residual urine.  Laboratory studies show essentially no remarkable findings.  His potassium is 3.9 and there is no acidosis.  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  This finding was discussed with Dr. Junious Silk from urology who is recommending UA, urine culture, and antibiotics.  Urinalysis  obtained by straight cath somewhat suggestive of a UTI.  Patient will be given IV Rocephin and discharged with Keflex pending urine culture.  He does not appear volume overloaded.  His blood pressure is normal, he is not hypoxic, and there are no rales on exam.  Patient missed his dialysis session today, however there does not appear to be an indication for emergent dialysis and I believe he can wait until his next session.  At this point I feel as though patient is appropriate for discharge to home.   Final Clinical Impression(s) / ED Diagnoses Final diagnoses:  None    Rx / DC Orders ED Discharge Orders  None       Veryl Speak, MD 01/16/20 1541    Veryl Speak, MD 01/16/20 1544

## 2020-01-16 NOTE — ED Notes (Signed)
Foley cath not inserted

## 2020-01-16 NOTE — ED Notes (Signed)
I/O cath  .

## 2020-01-16 NOTE — ED Notes (Signed)
PT reports pain with urination  And decreased urine output .

## 2020-01-16 NOTE — ED Triage Notes (Addendum)
Pt arrives POV with wife c/o being unable to urinate for the past 3 days. Pt reports he "feels the urge to urinate" and is unable to when he goes to the restroom; pt reports pain 10/10 (when trying to urinate). Pt denies having blood in urine, or prostate hx. Pt has hx of kidney failure and has been on dialysis for 2 years.

## 2020-01-16 NOTE — ED Notes (Signed)
Patient verbalizes understanding of discharge instructions . Opportunity for questions and answers were provided . Armband removed by staff ,Pt discharged from ED. W/C  offered at D/C  and Declined W/C at D/C and was escorted to lobby by RN.  

## 2020-01-17 LAB — URINE CULTURE: Culture: NO GROWTH

## 2020-02-17 ENCOUNTER — Ambulatory Visit (HOSPITAL_COMMUNITY): Payer: No Typology Code available for payment source

## 2020-02-19 ENCOUNTER — Ambulatory Visit (HOSPITAL_COMMUNITY): Payer: No Typology Code available for payment source

## 2020-02-21 ENCOUNTER — Ambulatory Visit (HOSPITAL_COMMUNITY): Payer: No Typology Code available for payment source

## 2020-02-24 ENCOUNTER — Ambulatory Visit (HOSPITAL_COMMUNITY): Payer: No Typology Code available for payment source

## 2020-02-26 ENCOUNTER — Ambulatory Visit (HOSPITAL_COMMUNITY): Payer: No Typology Code available for payment source

## 2020-02-28 ENCOUNTER — Telehealth (HOSPITAL_COMMUNITY): Payer: Self-pay | Admitting: Pharmacist

## 2020-02-28 ENCOUNTER — Ambulatory Visit (HOSPITAL_COMMUNITY): Payer: No Typology Code available for payment source

## 2020-02-28 NOTE — Telephone Encounter (Signed)
Cardiac Rehab Medication Review by a Pharmacist  Does the patient feel that his/her medications are working for him/her?  yes  Has the patient been experiencing any side effects to the medications prescribed?  no  Does the patient measure his/her own blood pressure or blood glucose at home?  yes  Average BG - 120s after meals   Does the patient have any problems obtaining medications due to transportation or finances?   no  Understanding of regimen: good Understanding of indications: good Potential of compliance: good   Pharmacist comments: N/A   Lorel Monaco, PharmD PGY1 Ambulatory Care Resident [facility] # [phone]

## 2020-03-02 ENCOUNTER — Ambulatory Visit (HOSPITAL_COMMUNITY): Payer: No Typology Code available for payment source

## 2020-03-03 ENCOUNTER — Telehealth (HOSPITAL_COMMUNITY): Payer: Self-pay

## 2020-03-03 NOTE — Telephone Encounter (Signed)
Cardiac Rehab Note:  Successful telephone encounter to Connor Hoover to confirm Cardiac Rehab orientation appointment for 03/05/20 at 0800. Unfortunately nursing assessment could not be completed at this time secondary to patient driving. Requested call back tomorrow.   Plan: Will call patient back for telephone health assessment tomorrow per patient request  Parker Rollene Rotunda RN, BSN [place]  Cardiac and Pulmonary Rehabilitation Phone: [phone] Fax: [phone]

## 2020-03-04 ENCOUNTER — Telehealth (HOSPITAL_COMMUNITY): Payer: Self-pay | Admitting: *Deleted

## 2020-03-04 ENCOUNTER — Ambulatory Visit (HOSPITAL_COMMUNITY): Payer: No Typology Code available for payment source

## 2020-03-05 ENCOUNTER — Encounter (HOSPITAL_COMMUNITY)
Admission: RE | Admit: 2020-03-05 | Discharge: 2020-03-05 | Disposition: A | Discharge status: Home / Self Care | Payer: No Typology Code available for payment source | Source: Ambulatory Visit | Attending: Cardiology | Admitting: Cardiology

## 2020-03-05 ENCOUNTER — Telehealth (HOSPITAL_COMMUNITY): Payer: Self-pay

## 2020-03-05 ENCOUNTER — Other Ambulatory Visit: Payer: Self-pay

## 2020-03-05 VITALS — BP 110/78 | Temp 96.8°F

## 2020-03-05 DIAGNOSIS — I5022 Chronic systolic (congestive) heart failure: Secondary | ICD-10-CM | POA: Insufficient documentation

## 2020-03-05 NOTE — Telephone Encounter (Signed)
Pt is having to get an ingrown toe nail removed and will not be able to do cardiac rehab for right now. Per Drucie Opitz, canceled his cardiac rehab appointments and placed pt ppw in the medical hold folder.

## 2020-03-05 NOTE — Progress Notes (Signed)
Incomplete Session Note  Patient Details  Name: Connor Hoover MRN: [id number] Date of Birth: Mar 08, 1946 Referring Provider:    Alphonsus Sias did not complete his rehab session.  Patient reported this morning for orientation and 6 minute walk test. Patient and his wife are present. Mr Maiolo has an appointment at the [place] next Wednesday as he has an ingrown toe nail. Mr Bonenberger would like to wait to start cardiac rehab until after his ingrown toe nail has been taken care of. The [facility] care department was called and notified in [place] with Randall Hiss. Patient said he will call when he gets physician clearance to start exercise when appropriate.Barnet Pall, RN,BSN 03/05/2020 8:29 AM

## 2020-03-06 ENCOUNTER — Ambulatory Visit (HOSPITAL_COMMUNITY): Payer: No Typology Code available for payment source

## 2020-03-09 ENCOUNTER — Ambulatory Visit (HOSPITAL_COMMUNITY): Payer: No Typology Code available for payment source

## 2020-03-11 ENCOUNTER — Ambulatory Visit (HOSPITAL_COMMUNITY): Payer: No Typology Code available for payment source

## 2020-03-13 ENCOUNTER — Ambulatory Visit (HOSPITAL_COMMUNITY): Payer: No Typology Code available for payment source

## 2020-03-16 ENCOUNTER — Ambulatory Visit (HOSPITAL_COMMUNITY): Payer: No Typology Code available for payment source

## 2020-03-18 ENCOUNTER — Ambulatory Visit (HOSPITAL_COMMUNITY): Payer: No Typology Code available for payment source

## 2020-03-20 ENCOUNTER — Ambulatory Visit (HOSPITAL_COMMUNITY): Payer: No Typology Code available for payment source

## 2020-03-23 ENCOUNTER — Ambulatory Visit (HOSPITAL_COMMUNITY): Payer: No Typology Code available for payment source

## 2020-03-25 ENCOUNTER — Ambulatory Visit (HOSPITAL_COMMUNITY): Payer: No Typology Code available for payment source

## 2020-03-27 ENCOUNTER — Ambulatory Visit (HOSPITAL_COMMUNITY): Payer: No Typology Code available for payment source

## 2020-04-01 ENCOUNTER — Ambulatory Visit (HOSPITAL_COMMUNITY): Payer: No Typology Code available for payment source

## 2020-04-03 ENCOUNTER — Ambulatory Visit (HOSPITAL_COMMUNITY): Payer: No Typology Code available for payment source

## 2020-04-06 ENCOUNTER — Ambulatory Visit (HOSPITAL_COMMUNITY): Payer: No Typology Code available for payment source

## 2020-04-08 ENCOUNTER — Ambulatory Visit (HOSPITAL_COMMUNITY): Payer: No Typology Code available for payment source

## 2020-04-10 ENCOUNTER — Ambulatory Visit (HOSPITAL_COMMUNITY): Payer: No Typology Code available for payment source

## 2020-04-13 ENCOUNTER — Ambulatory Visit (HOSPITAL_COMMUNITY): Payer: No Typology Code available for payment source

## 2020-04-15 ENCOUNTER — Ambulatory Visit (HOSPITAL_COMMUNITY): Payer: No Typology Code available for payment source

## 2020-04-17 ENCOUNTER — Ambulatory Visit (HOSPITAL_COMMUNITY): Payer: No Typology Code available for payment source

## 2020-04-20 ENCOUNTER — Ambulatory Visit (HOSPITAL_COMMUNITY): Payer: No Typology Code available for payment source

## 2020-04-22 ENCOUNTER — Ambulatory Visit (HOSPITAL_COMMUNITY): Payer: No Typology Code available for payment source

## 2020-04-24 ENCOUNTER — Ambulatory Visit (HOSPITAL_COMMUNITY): Payer: No Typology Code available for payment source

## 2020-04-27 ENCOUNTER — Ambulatory Visit (HOSPITAL_COMMUNITY): Payer: No Typology Code available for payment source

## 2020-04-29 ENCOUNTER — Ambulatory Visit (HOSPITAL_COMMUNITY): Payer: No Typology Code available for payment source

## 2020-05-01 ENCOUNTER — Ambulatory Visit (HOSPITAL_COMMUNITY): Payer: No Typology Code available for payment source

## 2020-05-30 ENCOUNTER — Emergency Department (HOSPITAL_COMMUNITY)
Admission: EM | Admit: 2020-05-30 | Discharge: 2020-06-03 | Disposition: A | Discharge status: Home / Self Care | Payer: No Typology Code available for payment source | Attending: Emergency Medicine | Admitting: Emergency Medicine

## 2020-05-30 ENCOUNTER — Other Ambulatory Visit: Payer: Self-pay

## 2020-05-30 ENCOUNTER — Encounter (HOSPITAL_COMMUNITY): Payer: Self-pay | Admitting: Emergency Medicine

## 2020-05-30 DIAGNOSIS — R600 Localized edema: Secondary | ICD-10-CM | POA: Diagnosis not present

## 2020-05-30 DIAGNOSIS — Y999 Unspecified external cause status: Secondary | ICD-10-CM | POA: Diagnosis not present

## 2020-05-30 DIAGNOSIS — Z79899 Other long term (current) drug therapy: Secondary | ICD-10-CM | POA: Insufficient documentation

## 2020-05-30 DIAGNOSIS — W19XXXA Unspecified fall, initial encounter: Secondary | ICD-10-CM | POA: Diagnosis not present

## 2020-05-30 DIAGNOSIS — I10 Essential (primary) hypertension: Secondary | ICD-10-CM | POA: Insufficient documentation

## 2020-05-30 DIAGNOSIS — I2510 Atherosclerotic heart disease of native coronary artery without angina pectoris: Secondary | ICD-10-CM | POA: Insufficient documentation

## 2020-05-30 DIAGNOSIS — R531 Weakness: Secondary | ICD-10-CM | POA: Diagnosis present

## 2020-05-30 DIAGNOSIS — Y939 Activity, unspecified: Secondary | ICD-10-CM | POA: Insufficient documentation

## 2020-05-30 DIAGNOSIS — Z7982 Long term (current) use of aspirin: Secondary | ICD-10-CM | POA: Diagnosis not present

## 2020-05-30 DIAGNOSIS — Z20822 Contact with and (suspected) exposure to covid-19: Secondary | ICD-10-CM | POA: Diagnosis not present

## 2020-05-30 DIAGNOSIS — Z794 Long term (current) use of insulin: Secondary | ICD-10-CM | POA: Diagnosis not present

## 2020-05-30 DIAGNOSIS — Z87891 Personal history of nicotine dependence: Secondary | ICD-10-CM | POA: Diagnosis not present

## 2020-05-30 DIAGNOSIS — E119 Type 2 diabetes mellitus without complications: Secondary | ICD-10-CM | POA: Insufficient documentation

## 2020-05-30 DIAGNOSIS — Z992 Dependence on renal dialysis: Secondary | ICD-10-CM | POA: Insufficient documentation

## 2020-05-30 DIAGNOSIS — Y929 Unspecified place or not applicable: Secondary | ICD-10-CM | POA: Diagnosis not present

## 2020-05-30 LAB — URINALYSIS, ROUTINE W REFLEX MICROSCOPIC
Bacteria, UA: NONE SEEN
Bilirubin Urine: NEGATIVE
Glucose, UA: >=500 mg/dL — AB
Ketones, ur: NEGATIVE mg/dL
Nitrite: NEGATIVE
Protein, ur: 30 mg/dL — AB
Specific Gravity, Urine: 1.013 (ref 1.005–1.030)
pH: 5 (ref 5.0–8.0)

## 2020-05-30 LAB — CBC
HCT: 35.5 % — ABNORMAL LOW (ref 39.0–52.0)
Hemoglobin: 11.5 g/dL — ABNORMAL LOW (ref 13.0–17.0)
MCH: 33.1 pg (ref 26.0–34.0)
MCHC: 32.4 g/dL (ref 30.0–36.0)
MCV: 102.3 fL — ABNORMAL HIGH (ref 80.0–100.0)
Platelets: 138 10*3/uL — ABNORMAL LOW (ref 150–400)
RBC: 3.47 MIL/uL — ABNORMAL LOW (ref 4.22–5.81)
RDW: 21.5 % — ABNORMAL HIGH (ref 11.5–15.5)
WBC: 13.6 10*3/uL — ABNORMAL HIGH (ref 4.0–10.5)
nRBC: 0.3 % — ABNORMAL HIGH (ref 0.0–0.2)

## 2020-05-30 LAB — BASIC METABOLIC PANEL
Anion gap: 18 — ABNORMAL HIGH (ref 5–15)
BUN: 50 mg/dL — ABNORMAL HIGH (ref 8–23)
CO2: 25 mmol/L (ref 22–32)
Calcium: 9.4 mg/dL (ref 8.9–10.3)
Chloride: 89 mmol/L — ABNORMAL LOW (ref 98–111)
Creatinine, Ser: 8.39 mg/dL — ABNORMAL HIGH (ref 0.61–1.24)
GFR calc Af Amer: 7 mL/min — ABNORMAL LOW (ref >60)
GFR calc non Af Amer: 6 mL/min — ABNORMAL LOW (ref >60)
Glucose, Bld: 223 mg/dL — ABNORMAL HIGH (ref 70–99)
Potassium: 5.2 mmol/L — ABNORMAL HIGH (ref 3.5–5.1)
Sodium: 132 mmol/L — ABNORMAL LOW (ref 135–145)

## 2020-05-30 LAB — RAPID URINE DRUG SCREEN, HOSP PERFORMED
Amphetamines: NOT DETECTED
Barbiturates: NOT DETECTED
Benzodiazepines: NOT DETECTED
Cocaine: NOT DETECTED
Opiates: NOT DETECTED
Tetrahydrocannabinol: NOT DETECTED

## 2020-05-30 LAB — CBG MONITORING, ED
Glucose-Capillary: 210 mg/dL — ABNORMAL HIGH (ref 70–99)
Glucose-Capillary: 236 mg/dL — ABNORMAL HIGH (ref 70–99)

## 2020-05-30 LAB — SARS CORONAVIRUS 2 BY RT PCR (HOSPITAL ORDER, PERFORMED IN ~~LOC~~ HOSPITAL LAB): SARS Coronavirus 2: NEGATIVE

## 2020-05-30 MED ORDER — ASPIRIN 81 MG PO CHEW
81.0000 mg | CHEWABLE_TABLET | Freq: Every day | ORAL | Status: DC
  Administered 2020-05-30 – 2020-06-03 (×3): 81 mg via ORAL
  Filled 2020-05-30 (×3): qty 1

## 2020-05-30 MED ORDER — ATORVASTATIN CALCIUM 40 MG PO TABS
40.0000 mg | ORAL_TABLET | Freq: Every day | ORAL | Status: DC
  Administered 2020-05-30 – 2020-06-01 (×3): 40 mg via ORAL
  Filled 2020-05-30 (×3): qty 1

## 2020-05-30 MED ORDER — INSULIN ASPART 100 UNIT/ML ~~LOC~~ SOLN
5.0000 [IU] | Freq: Three times a day (TID) | SUBCUTANEOUS | Status: DC
  Administered 2020-05-31 – 2020-06-02 (×2): 5 [IU] via SUBCUTANEOUS

## 2020-05-30 MED ORDER — SULFAMETHOXAZOLE-TRIMETHOPRIM 800-160 MG PO TABS
1.0000 | ORAL_TABLET | Freq: Two times a day (BID) | ORAL | Status: DC
  Administered 2020-05-30: 1 via ORAL
  Filled 2020-05-30: qty 1

## 2020-05-30 MED ORDER — MIDODRINE HCL 5 MG PO TABS
10.0000 mg | ORAL_TABLET | Freq: Three times a day (TID) | ORAL | Status: DC
  Administered 2020-05-31 – 2020-06-03 (×5): 10 mg via ORAL
  Filled 2020-05-30 (×6): qty 2

## 2020-05-30 MED ORDER — SODIUM CHLORIDE 0.9% FLUSH: 3.0000 mL | Freq: Once | INTRAVENOUS | Status: DC

## 2020-05-30 MED ORDER — INSULIN GLARGINE 100 UNIT/ML ~~LOC~~ SOLN
30.0000 [IU] | Freq: Every day | SUBCUTANEOUS | Status: DC
  Administered 2020-05-30 – 2020-06-01 (×3): 30 [IU] via SUBCUTANEOUS
  Filled 2020-05-30 (×6): qty 0.3

## 2020-05-30 MED ORDER — CINACALCET HCL 30 MG PO TABS
30.0000 mg | ORAL_TABLET | Freq: Every day | ORAL | Status: DC
  Administered 2020-05-30 – 2020-06-02 (×4): 30 mg via ORAL
  Filled 2020-05-30 (×5): qty 1

## 2020-05-30 MED ORDER — ACETAMINOPHEN 325 MG PO TABS
650.0000 mg | ORAL_TABLET | Freq: Four times a day (QID) | ORAL | Status: DC | PRN
  Filled 2020-05-30: qty 2

## 2020-05-30 MED ORDER — CYCLOBENZAPRINE HCL 10 MG PO TABS: 5.0000 mg | ORAL_TABLET | Freq: Three times a day (TID) | ORAL | Status: DC | PRN

## 2020-05-30 MED ORDER — RENAL MULTIVITAMIN/ZINC PO TABS: ORAL_TABLET | Freq: Every day | ORAL | Status: DC

## 2020-05-30 MED ORDER — LAMOTRIGINE 150 MG PO TABS
150.0000 mg | ORAL_TABLET | Freq: Every day | ORAL | Status: DC
  Administered 2020-05-30 – 2020-06-03 (×3): 150 mg via ORAL
  Filled 2020-05-30 (×6): qty 1

## 2020-05-30 MED ORDER — RENA-VITE PO TABS
1.0000 | ORAL_TABLET | Freq: Every day | ORAL | Status: DC
  Administered 2020-05-30 – 2020-06-01 (×3): 1 via ORAL
  Filled 2020-05-30 (×6): qty 1

## 2020-05-30 MED ORDER — SEVELAMER CARBONATE 800 MG PO TABS
1600.0000 mg | ORAL_TABLET | Freq: Three times a day (TID) | ORAL | Status: DC
  Administered 2020-05-31 – 2020-06-03 (×4): 1600 mg via ORAL
  Filled 2020-05-30 (×4): qty 2

## 2020-05-30 NOTE — Evaluation (Signed)
Physical Therapy Evaluation Patient Details Name: Connor Hoover MRN: [id number] DOB: [birth year] Today's Date: 05/30/2020   History of Present Illness  74 y.o. male presents with weakness after falling while going to bathroom. Pt reports vomiting at some point last night. Of note pt recently had a new vascular graft placed 2 weeks ago on the left arm. PMH significant for DM. HTN, peritoneal dialysis, and PTSD.  Clinical Impression  Pt demonstrates deficits in functional mobility ,gait, balance, strength, power, and endurance. Pt requires significant assistance to perform bed mobility on this date. PT defers attempts at standing as pt begins to slide toward edge of bed due to posterior postural lean in sitting with concern for sliding into floor. PT returns pt into supine. Pt also demonstrates limited activity tolerance this session. Pt will benefit from continued acute PT POC to improve mobility quality, strength, and to reduce falls risk. PT recommends SNF placement as the pt is a high falls risk and will benefit from inpatient PT services to improve mobility and reduce his high risk for falling.    Follow Up Recommendations SNF    Equipment Recommendations  Wheelchair (measurements PT);Wheelchair cushion (measurements PT);Hospital bed (mechanical lift, all if home today)    Recommendations for Other Services       Precautions / Restrictions Precautions Precautions: Fall Restrictions Weight Bearing Restrictions: No      Mobility  Bed Mobility Overal bed mobility: Needs Assistance Bed Mobility: Supine to Sit;Sit to Supine;Rolling Rolling: Mod assist   Supine to sit: Max assist Sit to supine: Max assist      Transfers                 General transfer comment: deferred transfer attempts as pt unable to square hips completely at edge of bed to get feet to floor as hips begin to slide off edge of bed  Ambulation/Gait                Stairs             Wheelchair Mobility    Modified Rankin (Stroke Patients Only)       Balance Overall balance assessment: Needs assistance Sitting-balance support: Bilateral upper extremity supported;Feet unsupported Sitting balance-Leahy Scale: Poor Sitting balance - Comments: mod-maxA for static sitting balance with UE support of rail and PT Postural control: Posterior lean                                   Pertinent Vitals/Pain Pain Assessment: No/denies pain    Home Living Family/patient expects to be discharged to:: Private residence Living Arrangements: Spouse/significant other Available Help at Discharge: Family;Available 24 hours/day Type of Home: House Home Access: Stairs to enter Entrance Stairs-Rails: None Entrance Stairs-Number of Steps: 4 Home Layout: One level Home Equipment: Walker - 4 wheels      Prior Function Level of Independence: Needs assistance   Gait / Transfers Assistance Needed: pt ambulates short household distances, recently with use of Rollator over past few weeks.  ADL's / Homemaking Assistance Needed: requires assist with ADLs        Hand Dominance        Extremity/Trunk Assessment   Upper Extremity Assessment Upper Extremity Assessment: Generalized weakness    Lower Extremity Assessment Lower Extremity Assessment: Generalized weakness (LE edema and redness bilaterally)    Cervical / Trunk Assessment Cervical / Trunk Assessment: Other exceptions Cervical / Trunk Exceptions: morbid  obesity  Communication   Communication: No difficulties  Cognition Arousal/Alertness: Awake/alert Behavior During Therapy: WFL for tasks assessed/performed Overall Cognitive Status: Within Functional Limits for tasks assessed                                        General Comments General comments (skin integrity, edema, etc.): VSS on  4L     Exercises     Assessment/Plan    PT Assessment Patient needs continued PT  services  PT Problem List Decreased strength;Decreased activity tolerance;Decreased balance;Decreased mobility;Decreased knowledge of use of DME       PT Treatment Interventions DME instruction;Gait training;Stair training;Functional mobility training;Therapeutic activities;Therapeutic exercise;Balance training;Neuromuscular re-education;Patient/family education    PT Goals (Current goals can be found in the Care Plan section)  Acute Rehab PT Goals Patient Stated Goal: To improve strength and mobility PT Goal Formulation: With patient Time For Goal Achievement: 06/13/20 Potential to Achieve Goals: Good    Frequency Min 2X/week   Barriers to discharge        Co-evaluation               AM-PAC PT "6 Clicks" Mobility  Outcome Measure Help needed turning from your back to your side while in a flat bed without using bedrails?: A Lot Help needed moving from lying on your back to sitting on the side of a flat bed without using bedrails?: Total Help needed moving to and from a bed to a chair (including a wheelchair)?: Total Help needed standing up from a chair using your arms (e.g., wheelchair or bedside chair)?: Total Help needed to walk in hospital room?: Total Help needed climbing 3-5 steps with a railing? : Total 6 Click Score: 7    End of Session Equipment Utilized During Treatment: Oxygen Activity Tolerance: Patient tolerated treatment well Patient left: in bed;with call bell/phone within reach;with family/visitor present Nurse Communication: Mobility status;Need for lift equipment PT Visit Diagnosis: Muscle weakness (generalized) (M62.81);Other abnormalities of gait and mobility (R26.89)    Time: 1730-1751 PT Time Calculation (min) (ACUTE ONLY): 21 min   Charges:   PT Evaluation $PT Eval Moderate Complexity: 1 Mod          Zenaida Niece, PT, DPT Acute Rehabilitation Pager: [phone]   Zenaida Niece 05/30/2020, 6:05 PM

## 2020-05-30 NOTE — ED Triage Notes (Signed)
EMS stated, pt. Is weak that started around 0400, and fell, no injury. Pt has a sacral pressure sore. Unable to get himself up and called [place].

## 2020-05-30 NOTE — ED Notes (Signed)
Pt ate all of apple sauce and finished apple juice and ate a a couple bites of the [place] sandwich

## 2020-05-30 NOTE — TOC Initial Note (Signed)
Transition of Care [facility]) - Initial/Assessment Note    Patient Details  Name: Chao Blazejewski MRN: [id number] Date of Birth: 1945-11-18  Transition of Care [facility]) CM/SW Contact:    Andria Frames, [place] Work Phone Number: 05/30/2020, 5:11 PM  Clinical Narrative: MSW Intern received consult in coordination with CSW for SNF work-up. MSW Intern met with patient to discuss SNF work-up needs.   Patient was open to intervention for SNF work-up needs. Patient provided verbal consent for referral. MSW Intern will begin SNF placement process and update family and team when bed offers are available. Patient and MSW intern discussed SNF placement preferences and notes patient reported [facility] is the preference and wants [facility] area.   MSW Intern will continue to follow for discharge supports.    Andria Frames, MSW Intern Clinical Social Work Intern [facility] Emergency Department Transitions of Care [phone]                   Expected Discharge Plan: Skilled Nursing Facility Barriers to Discharge: ED Bed availability, ED SNF auth   Patient Goals and CMS Choice Patient states their goals for this hospitalization and ongoing recovery are:: Patient and family reports they would like to follow through with SNF referral. CMS Medicare.gov Compare Post Acute Care list provided to:: Patient Choice offered to / list presented to : Patient, Spouse  Expected Discharge Plan and Services Expected Discharge Plan: [place] Acute Care Choice: [place] Living arrangements for the past 2 months: Single Family Home                                      Prior Living Arrangements/Services Living arrangements for the past 2 months: Single Family Home Lives with:: Self, Spouse Patient language and need for interpreter reviewed:: Yes Do you feel safe going back to the place where you live?: Yes      Need for Family  Participation in Patient Care: No (Comment) Care giver support system in place?: Yes (comment)   Criminal Activity/Legal Involvement Pertinent to Current Situation/Hospitalization: No - Comment as needed  Activities of Daily Living      Permission Sought/Granted Permission sought to share information with : Facility Sport and exercise psychologist, Family Supports Permission granted to share information with : Yes, Verbal Permission Granted  Share Information with NAME: Facility contacts for SNF referral           Emotional Assessment Appearance:: Appears stated age Attitude/Demeanor/Rapport: Engaged Affect (typically observed): Tearful/Crying Orientation: : Oriented to Self, Oriented to Place, Oriented to  Time, Oriented to Situation Alcohol / Substance Use: Not Applicable Psych Involvement: No (comment) (Patient has documented history of PTSD but is not currently receiving treatment.)  Admission diagnosis:  Gen Weakness; EKG changes Patient Active Problem List   Diagnosis Date Noted  . DIABETES MELLITUS, TYPE II, ON INSULIN 11/30/2009  . HYPERLIPIDEMIA 11/30/2009  . OBESITY 11/30/2009  . HYPERTENSION 11/30/2009  . 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 CAD, NATIVE VESSEL 11/30/2009  . GERD 11/30/2009  . RENAL INSUFFICIENCY 11/30/2009  . TOBACCO USE, QUIT 11/30/2009   PCP:  [facility], [place], [place] [address] Phone: [phone] Fax: [phone]  [id number] [place] [address] [place] [address] Phone: [phone] Fax: [phone]     Social Determinants of Health (SDOH) Interventions    Readmission Risk Interventions No flowsheet data found.

## 2020-05-30 NOTE — ED Notes (Addendum)
informed pt and wife of UA need

## 2020-05-30 NOTE — ED Notes (Signed)
Pt not able to stand, sit or move side by side without assistance. Unable to ambulate this pt. Orthostatic done just up to sitting up.

## 2020-05-30 NOTE — ED Provider Notes (Addendum)
[facility] EMERGENCY DEPARTMENT Provider Note   CSN: [id number] Arrival date & time: 05/30/20  5465     History Chief Complaint  Patient presents with  . Weakness  . Fall    Connor Hoover is a 74 y.o. male.  HPI He presents for evaluation of weakness after falling while going to the bathroom around 4 AM.  His wife works with him to get off the floor after that but he did not want to do it because he was feeling so weak.  Later, she called EMS who transferred him here for evaluation.  He arrived here at 8:07 AM.  I saw him in 8:25 AM, after he was held in the waiting room for an open examination room.  Patient is alert and conversant.  He states he is hungry.  He denies injuries from fall.  He states that he vomited last night.  Patient's wife arrived and was able to give history, at 11:28 AM.  She states that he frequently vomits, usually in the morning.  He has been on dialysis for 3 years and they started using a new vascular graft in the left arm, 2 weeks ago.  He still has a chest dialysis catheter in.  No other recent illnesses.  He dialyzes on Tuesday, Thursday and Saturday.  There are no other known modifying factors.    Past Medical History:  Diagnosis Date  . Diabetes mellitus without complication ([place])   . Hypertension   . Peritoneal dialysis status ([place])   . PTSD (post-traumatic stress disorder)     Patient Active Problem List   Diagnosis Date Noted  . DIABETES MELLITUS, TYPE II, ON INSULIN 11/30/2009  . HYPERLIPIDEMIA 11/30/2009  . OBESITY 11/30/2009  . HYPERTENSION 11/30/2009  . 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 CAD, NATIVE VESSEL 11/30/2009  . GERD 11/30/2009  . RENAL INSUFFICIENCY 11/30/2009  . TOBACCO USE, QUIT 11/30/2009    Past Surgical History:  Procedure Laterality Date  . CARDIAC SURGERY         No family history on file.  Social History   Tobacco Use  . Smoking status: Former Research scientist (life sciences)  .  Smokeless tobacco: Former Network engineer  . Vaping Use: Never used  Substance Use Topics  . Alcohol use: Not Currently  . Drug use: Not Currently    Home Medications Prior to Admission medications   Medication Sig Start Date End Date Taking? Authorizing Provider  acetaminophen (TYLENOL) 325 MG tablet Take 650 mg by mouth every 6 (six) hours as needed for mild pain or fever.   Yes [provider]  aspirin 81 MG chewable tablet Chew 81 mg by mouth daily.   Yes [provider]  atorvastatin (LIPITOR) 80 MG tablet Take 40 mg by mouth at bedtime.    Yes [provider]  B Complex-C-Folic Acid (B COMPLEX-VITAMIN C-FOLIC ACID) 1 MG tablet Take 1 tablet by mouth daily. 10/17/17  Yes [provider]  cinacalcet (SENSIPAR) 30 MG tablet Take 30 mg by mouth daily. With largest meal   Yes [provider]  cyclobenzaprine (FLEXERIL) 5 MG tablet Take 5 mg by mouth 3 (three) times daily as needed for muscle spasms.   Yes [provider]  guaiFENesin (GUAIFENEX LA) 600 MG 12 hr tablet Take 600 mg by mouth every 8 (eight) hours as needed (breathing).   Yes [provider]  insulin aspart (NOVOLOG) 100 UNIT/ML injection Inject 5 Units into the skin 3 (  three) times daily before meals.    Yes [provider]  insulin glargine (LANTUS) 100 UNIT/ML injection Inject 30 Units into the skin at bedtime.    Yes [provider]  lamoTRIgine (LAMICTAL) 150 MG tablet Take 150 mg by mouth daily.   Yes [provider]  lidocaine-prilocaine (EMLA) cream Apply 1 application topically 3 (three) times a week. 05/28/20  Yes [provider]  midodrine (PROAMATINE) 5 MG tablet Take 10 mg by mouth 3 (three) times daily with meals.   Yes [provider]  Multiple Vitamin (RENAL MULTIVITAMIN/ZINC PO) Take 1 tablet by mouth daily with supper.   Yes [provider]  sevelamer carbonate (RENVELA) 800 MG tablet Take 1,600  mg by mouth 3 (three) times daily with meals.   Yes [provider]  sulfamethoxazole-trimethoprim (BACTRIM DS) 800-160 MG tablet Take 1 tablet by mouth 2 (two) times daily. 05/25/20  Yes [provider]  lidocaine (LMX) 4 % cream Apply 1 application topically as needed. Patient not taking: Reported on 05/30/2020 09/15/19   Lorayne Bender, PA-C    Allergies    Lisinopril and Povidine [povidone iodine]  Review of Systems   Review of Systems  All other systems reviewed and are negative.   Physical Exam Updated Vital Signs BP 109/72   Pulse 84   Temp 98.6 F (37 C) (Oral)   Resp 16   Ht 5' 6.5" (1.689 m)   Wt (!) 120.2 kg   SpO2 100%   BMI 42.13 kg/m   Physical Exam Vitals and nursing note reviewed.  Constitutional:      General: He is not in acute distress.    Appearance: He is well-developed. He is obese. He is not ill-appearing, toxic-appearing or diaphoretic.  HENT:     Head: Normocephalic and atraumatic.     Right Ear: External ear normal.     Left Ear: External ear normal.  Eyes:     Conjunctiva/sclera: Conjunctivae normal.     Pupils: Pupils are equal, round, and reactive to light.  Neck:     Trachea: Phonation normal.  Cardiovascular:     Rate and Rhythm: Normal rate and regular rhythm.     Heart sounds: Normal heart sounds.  Pulmonary:     Effort: Pulmonary effort is normal. No respiratory distress.     Breath sounds: Normal breath sounds. No stridor.  Abdominal:     General: There is no distension.     Palpations: Abdomen is soft. There is no mass.     Tenderness: There is no abdominal tenderness.     Hernia: No hernia is present.  Genitourinary:    Comments: Normal anus.  Superficial skin breakdown, right buttock medially.  No associated bleeding, drainage or induration. Musculoskeletal:        General: Normal range of motion.     Cervical back: Normal range of motion and neck supple.     Right lower leg: Edema present.     Left lower  leg: Edema present.  Skin:    General: Skin is warm and dry.     Comments: Nonspecific subacute lower leg dermal lesions, dark in color without bleeding, drainage or tenderness.  Neurological:     Mental Status: He is alert and oriented to person, place, and time.     Cranial Nerves: No cranial nerve deficit.     Sensory: No sensory deficit.     Motor: No abnormal muscle tone.     Coordination: Coordination normal.  Psychiatric:        Mood and Affect: Mood normal.        Behavior: Behavior normal.        Thought Content: Thought content normal.        Judgment: Judgment normal.     ED Results / Procedures / Treatments   Labs (all labs ordered are listed, but only abnormal results are displayed) Labs Reviewed  BASIC METABOLIC PANEL - Abnormal; Notable for the following components:      Result Value   Sodium 132 (*)    Potassium 5.2 (*)    Chloride 89 (*)    Glucose, Bld 223 (*)    BUN 50 (*)    Creatinine, Ser 8.39 (*)    GFR calc non Af Amer 6 (*)    GFR calc Af Amer 7 (*)    Anion gap 18 (*)    All other components within normal limits  CBC - Abnormal; Notable for the following components:   WBC 13.6 (*)    RBC 3.47 (*)    Hemoglobin 11.5 (*)    HCT 35.5 (*)    MCV 102.3 (*)    RDW 21.5 (*)    Platelets 138 (*)    nRBC 0.3 (*)    All other components within normal limits  URINALYSIS, ROUTINE W REFLEX MICROSCOPIC - Abnormal; Notable for the following components:   Color, Urine AMBER (*)    APPearance HAZY (*)    Glucose, UA >=500 (*)    Hgb urine dipstick SMALL (*)    Protein, ur 30 (*)    Leukocytes,Ua TRACE (*)    All other components within normal limits  CBG MONITORING, ED - Abnormal; Notable for the following components:   Glucose-Capillary 210 (*)    All other components within normal limits  SARS CORONAVIRUS 2 BY RT PCR (HOSPITAL ORDER, [place] [facility])  RAPID URINE DRUG SCREEN, HOSP PERFORMED    EKG EKG  Interpretation  Date/Time:  Saturday May 30 2020 08:04:23 EDT Ventricular Rate:  94 PR Interval:    QRS Duration: 168 QT Interval:  438 QTC Calculation: 547 R Axis:   -53 Text Interpretation: Sinus rhythm with sinus arrhythmia with 1st degree A-V block Left axis deviation Right bundle branch block Left ventricular hypertrophy with repolarization abnormality ( R in aVL ) Anterolateral infarct , age undetermined Abnormal ECG since last tracing no significant change Confirmed by Daleen Bo [phone]) on 05/30/2020 2:33:48 PM   Radiology No results found.  Procedures Procedures (including critical care time)  Medications Ordered in ED Medications  sodium chloride flush (NS) 0.9 % injection 3 mL (3 mLs Intravenous Not Given 05/30/20 1145)    ED Course  I have reviewed the triage vital signs and the nursing notes.  Pertinent labs & imaging results that were available during my care of the patient were reviewed by me and considered in my medical decision making (see chart for details).  Clinical Course as of May 30 1845  Sat May 30, 2020  1429 Normal  Urine rapid drug screen (hosp performed) [EW]  1431 Abnormal, elevated  CBG monitoring, ED(!) [EW]  1432 Normal except white count high, hemoglobin low, MCV high, platelets low  CBC(!) [EW]  1432 Normal except presence of glucose, hemoglobin, protein and trace leukocytes.  Also presence of 6-10 white cells, minimally elevated.  Urinalysis, Routine w reflex microscopic(!) [EW]  1432 Normal except sodium low, potassium high, chloride low, glucose high, BUN  high, creatinine high, GFR low  Basic metabolic panel(!) [EW]  5284 Unable to ambulate due to weakness.  Orthostatics, measured from lying to sitting, negative.  Patient is eating.   [EW]  1603 Patient continues to be comfortable and currently denies pain, in his legs.  He was unable to stand because when standing he had pain in his legs.  Patient, his wife, and his daughter on the  telephone were updated on findings and plan.  Social work has been consulted, PT has been consulted.  Patient may require rehab placement.   [EW]  1842 normal  SARS Coronavirus 2 by RT PCR (hospital order, performed in [facility] hospital lab) Nasopharyngeal Nasopharyngeal Swab [EW]  1324 Patient was seen by PT who were unable to get him to walk because of weakness, and unsteadiness.  They are recommending skilled placement.  He will require wheelchair, hospital bed, and observed care.   [EW]  [id number] Patient seen by social work, filled out FL 2, which I signed, and they are working on placement.   [EW]  [id number] Patient meets criteria for border status until he can be placed.   [EW]    Clinical Course User Index [EW] Daleen Bo, MD   MDM Rules/Calculators/A&P                           Patient Vitals for the past 24 hrs:  BP Temp Temp src Pulse Resp SpO2 Height Weight  05/30/20 1509 109/72 -- -- 84 -- 100 % -- --  05/30/20 1125 -- -- -- -- -- -- 5' 6.5" (1.689 m) (!) 120.2 kg  05/30/20 1026 (!) 106/64 -- -- 74 16 100 % -- --  05/30/20 0911 102/72 -- -- 91 18 100 % -- --  05/30/20 0807 (!) 110/59 98.6 F (37 C) Oral 61 22 100 % -- --    4:04 PM Reevaluation with update and discussion. After initial assessment and treatment, an updated evaluation reveals no change in clinical status, findings discussed and questions answered. Daleen Bo   Medical Decision Making:  This patient is presenting for evaluation of fall, which does require a range of treatment options, and is a complaint that involves a moderate risk of morbidity and mortality. The differential diagnoses include injuries, acute illnesses, complications from diabetes and end-stage renal disease. I decided to review old records, and in summary elderly obese male, who had a fall during the night because of weakness, presenting alert with nonfocal neurologic exam.  I obtained additional historical information from wife at  bedside.  Clinical Laboratory Tests Ordered, included CBC, Metabolic panel, Urinalysis and UDS. Review indicates normal except mild elevation of white count, hemoglobin low, MCV high, platelets low.   Cardiac Monitor Tracing which shows normal sinus rhythm     Critical Interventions-clinical evaluation, laboratory testing, EKG, observation reassessment.  After These Interventions, the Patient was reevaluated and was found to be generally weak, and having a fall.  No injuries suspected from fall.  He was unable to ambulate afterwards, because of general weakness.  He was able to eat in the ED but still unable to stand and walk.  He has a superficial pressure sore, right buttocks, not requiring intervention.  Orthostatics negative, down from supine to sitting.  Unable to stand in the ED.  Physical therapy consultation ordered.  Social work consult ordered for possible rehab placement.  CRITICAL CARE-no Performed by: Daleen Bo  Nursing Notes Reviewed/ Care Coordinated Applicable  Imaging Reviewed Interpretation of Laboratory Data incorporated into ED treatment   Plan-social work consult, PT consult, disposition pending.    Final Clinical Impression(s) / ED Diagnoses Final diagnoses:  Fall, initial encounter  Weakness    Rx / DC Orders ED Discharge Orders    None       Daleen Bo, MD 05/30/20 1608    Daleen Bo, MD 05/30/20 [phone]

## 2020-05-30 NOTE — NC FL2 (Signed)
[place] LEVEL OF CARE SCREENING TOOL     IDENTIFICATION  Patient Name: Connor Hoover Birthdate: 1946-03-12 Sex: male Admission Date (Current Location): 05/30/2020  [facility] and [place] Number:  Herbalist and Address:  [facility] [place]. [facility], [place] [address], [place], [place] [address]      Provider Number: [id number]  Attending Physician Name and Address:  Daleen Bo, MD  Relative Name and Phone Number:  Khaalid Lefkowitz   [phone]    Current Level of Care: Other (Comment) (Emergency Department) Recommended Level of Care: [place] Prior Approval Number:    Date Approved/Denied: 07/06/17 PASRR Number: [id number]  Discharge Plan: Home    Current Diagnoses: Patient Active Problem List   Diagnosis Date Noted  . DIABETES MELLITUS, TYPE II, ON INSULIN 11/30/2009  . HYPERLIPIDEMIA 11/30/2009  . OBESITY 11/30/2009  . HYPERTENSION 11/30/2009  . 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 CAD, NATIVE VESSEL 11/30/2009  . GERD 11/30/2009  . RENAL INSUFFICIENCY 11/30/2009  . TOBACCO USE, QUIT 11/30/2009    Orientation RESPIRATION BLADDER Height & Weight     Self, Time, Situation, Place  Normal Continent Weight: (!) 264 lb 15.9 oz (120.2 kg) Height:  5' 6.5" (168.9 cm)  BEHAVIORAL SYMPTOMS/MOOD NEUROLOGICAL BOWEL NUTRITION STATUS      Continent    AMBULATORY STATUS COMMUNICATION OF NEEDS Skin     Verbally Normal                       Personal Care Assistance Level of Assistance              Functional Limitations Info             SPECIAL CARE FACTORS FREQUENCY  PT (By licensed PT), OT (By licensed OT)     PT Frequency: 5x weekly OT Frequency: 5x weekly            Contractures Contractures Info: Not present    Additional Factors Info      Allergies Info: Lisinopril , Povidine           Current Medications (05/30/2020):  This is the current hospital  active medication list Current Facility-Administered Medications  Medication Dose Route Frequency Provider Last Rate Last Admin  . sodium chloride flush (NS) 0.9 % injection 3 mL  3 mL Intravenous Once Daleen Bo, MD       Current Outpatient Medications  Medication Sig Dispense Refill  . acetaminophen (TYLENOL) 325 MG tablet Take 650 mg by mouth every 6 (six) hours as needed for mild pain or fever.    Marland Kitchen aspirin 81 MG chewable tablet Chew 81 mg by mouth daily.    Marland Kitchen atorvastatin (LIPITOR) 80 MG tablet Take 40 mg by mouth at bedtime.     . B Complex-C-Folic Acid (B COMPLEX-VITAMIN C-FOLIC ACID) 1 MG tablet Take 1 tablet by mouth daily.    . cinacalcet (SENSIPAR) 30 MG tablet Take 30 mg by mouth daily. With largest meal    . cyclobenzaprine (FLEXERIL) 5 MG tablet Take 5 mg by mouth 3 (three) times daily as needed for muscle spasms.    Marland Kitchen guaiFENesin (GUAIFENEX LA) 600 MG 12 hr tablet Take 600 mg by mouth every 8 (eight) hours as needed (breathing).    . insulin aspart (NOVOLOG) 100 UNIT/ML injection Inject 5 Units into the skin 3 (three) times daily before meals.     . insulin glargine (LANTUS) 100 UNIT/ML injection  Inject 30 Units into the skin at bedtime.     . lamoTRIgine (LAMICTAL) 150 MG tablet Take 150 mg by mouth daily.    Marland Kitchen lidocaine-prilocaine (EMLA) cream Apply 1 application topically 3 (three) times a week.    . midodrine (PROAMATINE) 5 MG tablet Take 10 mg by mouth 3 (three) times daily with meals.    . Multiple Vitamin (RENAL MULTIVITAMIN/ZINC PO) Take 1 tablet by mouth daily with supper.    . sevelamer carbonate (RENVELA) 800 MG tablet Take 1,600 mg by mouth 3 (three) times daily with meals.    Marland Kitchen sulfamethoxazole-trimethoprim (BACTRIM DS) 800-160 MG tablet Take 1 tablet by mouth 2 (two) times daily.    Marland Kitchen lidocaine (LMX) 4 % cream Apply 1 application topically as needed. (Patient not taking: Reported on 05/30/2020) 30 g 0     Discharge Medications: Please see discharge summary  for a list of discharge medications.  Relevant Imaging Results:  Relevant Lab Results:   Additional Information    Andria Frames, [place] [employer]

## 2020-05-30 NOTE — ED Notes (Signed)
Pt complete care at this time not able to stand up by himself, needing 3 staff to get him on bed.

## 2020-05-30 NOTE — ED Notes (Signed)
Gave pt apple juice, water, [place] sandwich. Per Pt and pt wife requested

## 2020-05-31 ENCOUNTER — Emergency Department (HOSPITAL_COMMUNITY): Payer: No Typology Code available for payment source

## 2020-05-31 LAB — CBG MONITORING, ED
Glucose-Capillary: 110 mg/dL — ABNORMAL HIGH (ref 70–99)
Glucose-Capillary: 179 mg/dL — ABNORMAL HIGH (ref 70–99)
Glucose-Capillary: 161 mg/dL — ABNORMAL HIGH (ref 70–99)
Glucose-Capillary: 177 mg/dL — ABNORMAL HIGH (ref 70–99)

## 2020-05-31 LAB — BASIC METABOLIC PANEL
Anion gap: 19 — ABNORMAL HIGH (ref 5–15)
BUN: 62 mg/dL — ABNORMAL HIGH (ref 8–23)
CO2: 25 mmol/L (ref 22–32)
Calcium: 9.2 mg/dL (ref 8.9–10.3)
Chloride: 92 mmol/L — ABNORMAL LOW (ref 98–111)
Creatinine, Ser: 10.15 mg/dL — ABNORMAL HIGH (ref 0.61–1.24)
GFR calc Af Amer: 5 mL/min — ABNORMAL LOW (ref >60)
GFR calc non Af Amer: 5 mL/min — ABNORMAL LOW (ref >60)
Glucose, Bld: 184 mg/dL — ABNORMAL HIGH (ref 70–99)
Potassium: 5.2 mmol/L — ABNORMAL HIGH (ref 3.5–5.1)
Sodium: 136 mmol/L (ref 135–145)

## 2020-05-31 MED ORDER — SULFAMETHOXAZOLE-TRIMETHOPRIM 800-160 MG PO TABS
1.0000 | ORAL_TABLET | Freq: Every day | ORAL | Status: DC
  Administered 2020-06-01 – 2020-06-03 (×2): 1 via ORAL
  Filled 2020-05-31 (×2): qty 1

## 2020-05-31 MED ORDER — CHLORHEXIDINE GLUCONATE CLOTH 2 % EX PADS: 6.0000 | MEDICATED_PAD | Freq: Every day | CUTANEOUS | Status: DC

## 2020-05-31 NOTE — ED Notes (Signed)
Pt's CBG result was 179. Informed Clark - RN.

## 2020-05-31 NOTE — Progress Notes (Signed)
CSW reviewed bed offer for [facility] with the patient and noted the patient accepted bed offer. CSW confirmed bed offer with Abigail Butts at [facility] and notes bed offer will not be available until tomorrow 06/01/2020.

## 2020-05-31 NOTE — ED Notes (Signed)
Report gotten from prior nurse at 1500 no medications given prior to this RN.

## 2020-05-31 NOTE — ED Notes (Signed)
Patient transported to X-ray 

## 2020-05-31 NOTE — Progress Notes (Addendum)
Have noted Mr. Upchurch has been in the /EDw since yesterday.  He missed his usual TTS dialysis at [facility].  He ran his full treatment Thursday and left 0.5 below his EDW that day.  Given K was 5.2 Saturday,  BMP and CXR  just ordered for today.  Sunday staffing issues limit dialysis to emergencies.  If K is elevated would treat with lokelma.  Could possibly reschedule outpatient HD at [facility] on Monday or could dialyze Monday first round as extended dialysis from the ED.  Did note that plan was to admit to SNF that day as well. Have changed diet to renal carb mod with fluid restriction.  Amalia Hailey, [facility] [phone]

## 2020-05-31 NOTE — ED Notes (Signed)
Oxygen tank replaced °

## 2020-05-31 NOTE — ED Notes (Signed)
Pts wife wanted to speak to social work... case manager is going to let social work know and get him to come down and talk to pts wife.

## 2020-05-31 NOTE — Consult Note (Signed)
Brief Renal Consult Note.  HPI Mr. 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  His last dialysis was 7/29. He ran his full time of 4.5 hours.  He has a new AVGG (listed as AVF) running at an average of BFR of 260. Access issues are followed at the [place].  He has chronic hypotension  Post HD weight was 117.5 0.5 below edw  on 7/29.  Pre and post HD BP are in the low 100s - he rarely has a standing BP recorded.  He denies SOB, but has anorexia and food doesn't taste good especially meat, although he was able to eat a hotdog and baked beans Friday per wife. 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  His wife is also concerned about a small "bedsore" which has already been seen by EDP.    Medications reviewed in Epic - changed Septra DS to Q day dosing   CXR today shows mild marked CM and mild diffuse interstial pulmonary edema with smal to mod left pleural effusion Labs: K 5.2 Cr 10 CO2 25 hgb 11.5 WBC 13.6  plts 138  PE: Vitals with BMI 05/31/2020 05/30/2020 05/30/2020  Height - - -  Weight - - -  BMI - - -  Systolic 465 - 681  Diastolic 275 - 72  Pulse 90 88 84   Older obese male using O2 NAD on hallway stretcher Heart: RRR Lungs: BS diminished  Extr: tr-1+ edema, mild erythema left lower leg Access: left upper  AVF + bruit  Assessment and Plan:  1. ESRD HD in am first round before transfer to SNF.  He will continue to have volume titrated down in outpatient HD unit as BP allows Resume HD at his usual dialysis unit Tuesday. Plan use catheter Monday to assure no accesss issues and then resume AVF use at home unit 2. Chronic hypotension/volume excess - on midodrine 10 TID - UF as BP allows 3. Anorexia - with probably weight loss - SNF will need to given  supplements if not eating - he does like Boost. 4. MBD/Anemia - continue outpatient management Tuesday at home unit.

## 2020-05-31 NOTE — ED Provider Notes (Addendum)
Patient is awaiting SNF placement.  He has been assessed for ambulatory dysfunction and weakness.  Patient does have ESRD on dialysis.  Patient's last dialysis was done on Thursday which was 3 days ago.  Patient missed Saturday dialysis being in the emergency department awaiting placement.  Patient does not have acute complaints of increasing shortness of breath or chest pain.  Will repeat basic chemistry panel to monitor potassium.  Will order chest x-ray to assess for any increasing volume overload. Physical Exam  BP (!) 148/115 (BP Location: Left Arm)   Pulse 90   Temp 98.3 F (36.8 C)   Resp 16   Ht 5' 6.5" (1.689 m)   Wt (!) 120.2 kg   SpO2 100%   BMI 42.13 kg/m   Physical Exam Constitutional:      Comments: Patient is alert.  Following commands appropriately.  No respiratory distress at rest.  Cardiovascular:     Rate and Rhythm: Normal rate and regular rhythm.  Pulmonary:     Comments: No respiratory distress at rest.  Left lung field is clear.  Some crackles at the right base. Musculoskeletal:     Comments: Trace peripheral edema.  Calves soft nontender.  Skin condition of lower legs good.  Patient has chronic hyperpigmented nodules without any associated erythema.     ED Course/Procedures   Clinical Course as of May 31 1102  Sat May 30, 2020  1429 Normal  Urine rapid drug screen (hosp performed) [EW]  1431 Abnormal, elevated  CBG monitoring, ED(!) [EW]  1432 Normal except white count high, hemoglobin low, MCV high, platelets low  CBC(!) [EW]  1432 Normal except presence of glucose, hemoglobin, protein and trace leukocytes.  Also presence of 6-10 white cells, minimally elevated.  Urinalysis, Routine w reflex microscopic(!) [EW]  1432 Normal except sodium low, potassium high, chloride low, glucose high, BUN high, creatinine high, GFR low  Basic metabolic panel(!) [EW]  7253 Unable to ambulate due to weakness.  Orthostatics, measured from lying to sitting, negative.   Patient is eating.   [EW]  1603 Patient continues to be comfortable and currently denies pain, in his legs.  He was unable to stand because when standing he had pain in his legs.  Patient, his wife, and his daughter on the telephone were updated on findings and plan.  Social work has been consulted, PT has been consulted.  Patient may require rehab placement.   [EW]  1842 normal  SARS Coronavirus 2 by RT PCR (hospital order, performed in [facility] hospital lab) Nasopharyngeal Nasopharyngeal Swab [EW]  6644 Patient was seen by PT who were unable to get him to walk because of weakness, and unsteadiness.  They are recommending skilled placement.  He will require wheelchair, hospital bed, and observed care.   [EW]  [id number] Patient seen by social work, filled out FL 2, which I signed, and they are working on placement.   [EW]  [id number] Patient meets criteria for border status until he can be placed.   [EW]    Clinical Course User Index [EW] Daleen Bo, MD    Procedures  MDM  Patient is awaiting SNF placement.  Patient missed his Saturday dialysis due to being in the emergency department.  Will assess for any urgent or emergent dialysis needs.  Once basic chemistry panel and chest x-ray returned, will consult nephrology to plan patient's next dialysis session.  Consult: I have consulted Dr. Posey Pronto of nephrology (15: 09).  We reviewed his chemistries and patient  having missed Saturday dialysis due to ED boarding.  He will consult and evaluate the patient and put on schedule for dialysis.  Still unclear as to when patient will have placement.  Current notes indicate [facility] but no bed availability until tomorrow.    Charlesetta Shanks, MD 05/31/20 1106    Charlesetta Shanks, MD 05/31/20 1510

## 2020-06-01 LAB — CBG MONITORING, ED
Glucose-Capillary: 107 mg/dL — ABNORMAL HIGH (ref 70–99)
Glucose-Capillary: 160 mg/dL — ABNORMAL HIGH (ref 70–99)

## 2020-06-01 MED ORDER — HEPARIN SODIUM (PORCINE) 1000 UNIT/ML IJ SOLN
INTRAMUSCULAR | Status: AC
  Filled 2020-06-01: qty 4

## 2020-06-01 NOTE — ED Notes (Signed)
Lunch Tray Ordered @ [id number].

## 2020-06-01 NOTE — Progress Notes (Addendum)
2:45pm: CSW spoke with patient and his wife again to obtain permission to attempt an insurance authorization from [place] - both stated agreement. CSW will initiate insurance authorization from [facility] via [place] portal.  2pm: CSW completed federal FL2 worksheet and obtained clinical documentation which was sent to Ford at the [facility] for SNF approval. CSW faxed patient's information to [facility] in [place] for review.  1pm: CSW spoke with Janie at [facility] who states the facility has restrictions on admissions this week due to a COVID outbreak.  CSW spoke with patient's wife to inform her of information - wife already aware after speaking with a staff member from [facility]. Patient's wife states she does not have a preference for a facility, just hopeful that it will be in [place].  CSW spoke with staff member at [facility] who states this patient's is 100% service connected and is eligible for VA funded SNF. CSW was given a list of facilities that are contracted with the [place] which are [facility], [facility], and [facility] in [place] to obtain information regarding bed availability.   [name], MSW, LCSW-A Transitions of Care  Clinical Social Worker  [facility] Emergency Departments  Medical ICU [phone]

## 2020-06-01 NOTE — Procedures (Signed)
I was present at this dialysis session. I have reviewed the session itself and made appropriate changes.   Vital signs in last 24 hours:  Temp:  [98.2 F (36.8 C)-98.4 F (36.9 C)] 98.2 F (36.8 C) (08/02 0643) Pulse Rate:  [65-85] 65 (08/02 0800) Resp:  [18-23] 22 (08/02 0800) BP: (97-127)/(69-115) 122/71 (08/02 0800) SpO2:  [94 %-100 %] 100 % (08/02 0800) Weight change:  Filed Weights   05/30/20 1125  Weight: (!) 120.2 kg    Recent Labs  Lab 05/31/20 1351  NA 136  K 5.2*  CL 92*  CO2 25  GLUCOSE 184*  BUN 62*  CREATININE 10.15*  CALCIUM 9.2    Recent Labs  Lab 05/30/20 0820  WBC 13.6*  HGB 11.5*  HCT 35.5*  MCV 102.3*  PLT 138*    Scheduled Meds: . aspirin  81 mg Oral Daily  . atorvastatin  40 mg Oral QHS  . Chlorhexidine Gluconate Cloth  6 each Topical Q0600  . cinacalcet  30 mg Oral Q supper  . insulin aspart  5 Units Subcutaneous TID AC  . insulin glargine  30 Units Subcutaneous QHS  . lamoTRIgine  150 mg Oral Daily  . midodrine  10 mg Oral TID WC  . multivitamin  1 tablet Oral QHS  . sevelamer carbonate  1,600 mg Oral TID WC  . sodium chloride flush  3 mL Intravenous Once  . sulfamethoxazole-trimethoprim  1 tablet Oral Daily   Continuous Infusions: PRN Meds:.acetaminophen, cyclobenzaprine    Assessment and Plan: 1. ESRD- normally on TTS schedule but off schedule due to missed treatment on 05/30/20.  Will resume usual outpatient HD schedule tomorrow 2. Disposition- for admission to [facility] SNF today after HD due to debility.  Donetta Potts,  MD 06/01/2020, 8:31 AM

## 2020-06-01 NOTE — ED Notes (Signed)
Oxygen tank replaced °

## 2020-06-02 NOTE — ED Notes (Signed)
Mepilex put on pt.

## 2020-06-02 NOTE — ED Notes (Signed)
Pt turned

## 2020-06-02 NOTE — Progress Notes (Signed)
CSW spoke with [name] at [employer] who states this patient's insurance authorization is still pending.  [name], MSW, LCSW-A Transitions of Care  Clinical Social Worker  [facility] Emergency Departments  Medical ICU [phone]

## 2020-06-02 NOTE — ED Notes (Signed)
Pt assisted with sitting up in bed to eat breakfast

## 2020-06-03 LAB — CBC
HCT: 32.5 % — ABNORMAL LOW (ref 39.0–52.0)
Hemoglobin: 10.6 g/dL — ABNORMAL LOW (ref 13.0–17.0)
MCH: 33 pg (ref 26.0–34.0)
MCHC: 32.6 g/dL (ref 30.0–36.0)
MCV: 101.2 fL — ABNORMAL HIGH (ref 80.0–100.0)
Platelets: 138 10*3/uL — ABNORMAL LOW (ref 150–400)
RBC: 3.21 MIL/uL — ABNORMAL LOW (ref 4.22–5.81)
RDW: 19.9 % — ABNORMAL HIGH (ref 11.5–15.5)
WBC: 9.6 10*3/uL (ref 4.0–10.5)
nRBC: 0 % (ref 0.0–0.2)

## 2020-06-03 LAB — CBG MONITORING, ED
Glucose-Capillary: 131 mg/dL — ABNORMAL HIGH (ref 70–99)
Glucose-Capillary: 67 mg/dL — ABNORMAL LOW (ref 70–99)

## 2020-06-03 LAB — RENAL FUNCTION PANEL
Albumin: 3.5 g/dL (ref 3.5–5.0)
Anion gap: 16 — ABNORMAL HIGH (ref 5–15)
BUN: 49 mg/dL — ABNORMAL HIGH (ref 8–23)
CO2: 25 mmol/L (ref 22–32)
Calcium: 8.5 mg/dL — ABNORMAL LOW (ref 8.9–10.3)
Chloride: 91 mmol/L — ABNORMAL LOW (ref 98–111)
Creatinine, Ser: 9.22 mg/dL — ABNORMAL HIGH (ref 0.61–1.24)
GFR calc Af Amer: 6 mL/min — ABNORMAL LOW (ref >60)
GFR calc non Af Amer: 5 mL/min — ABNORMAL LOW (ref >60)
Glucose, Bld: 126 mg/dL — ABNORMAL HIGH (ref 70–99)
Phosphorus: 7.1 mg/dL — ABNORMAL HIGH (ref 2.5–4.6)
Potassium: 4.8 mmol/L (ref 3.5–5.1)
Sodium: 132 mmol/L — ABNORMAL LOW (ref 135–145)

## 2020-06-03 LAB — SARS CORONAVIRUS 2 BY RT PCR (HOSPITAL ORDER, PERFORMED IN ~~LOC~~ HOSPITAL LAB): SARS Coronavirus 2: NEGATIVE

## 2020-06-03 MED ORDER — HEPARIN SODIUM (PORCINE) 1000 UNIT/ML DIALYSIS
20.0000 [IU]/kg | INTRAMUSCULAR | Status: DC | PRN
  Filled 2020-06-03: qty 3

## 2020-06-03 MED ORDER — HEPARIN SODIUM (PORCINE) 1000 UNIT/ML IJ SOLN
INTRAMUSCULAR | Status: AC
  Filled 2020-06-03: qty 4

## 2020-06-03 NOTE — ED Notes (Signed)
PTAR called @ 1402-per Janett Billow, RN called by Levada Dy

## 2020-06-03 NOTE — ED Notes (Signed)
Pt taken to dialysis 

## 2020-06-03 NOTE — ED Notes (Signed)
Breakfast Ordered 

## 2020-06-03 NOTE — Procedures (Signed)
I was present at this dialysis session. I have reviewed the session itself and made appropriate changes.   Vital signs in last 24 hours:  Temp:  [97.8 F (36.6 C)-97.9 F (36.6 C)] 97.8 F (36.6 C) (08/04 0511) Pulse Rate:  [64-89] 64 (08/04 0730) Resp:  [16-18] 18 (08/04 0708) BP: (93-125)/(62-82) 111/62 (08/04 0730) SpO2:  [98 %-100 %] 98 % (08/04 0708) Weight change:  Filed Weights   05/30/20 1125  Weight: (!) 120.2 kg    Recent Labs  Lab 05/31/20 1351  NA 136  K 5.2*  CL 92*  CO2 25  GLUCOSE 184*  BUN 62*  CREATININE 10.15*  CALCIUM 9.2    Recent Labs  Lab 05/30/20 0820  WBC 13.6*  HGB 11.5*  HCT 35.5*  MCV 102.3*  PLT 138*    Scheduled Meds: . aspirin  81 mg Oral Daily  . atorvastatin  40 mg Oral QHS  . Chlorhexidine Gluconate Cloth  6 each Topical Q0600  . cinacalcet  30 mg Oral Q supper  . insulin aspart  5 Units Subcutaneous TID AC  . insulin glargine  30 Units Subcutaneous QHS  . lamoTRIgine  150 mg Oral Daily  . midodrine  10 mg Oral TID WC  . multivitamin  1 tablet Oral QHS  . sevelamer carbonate  1,600 mg Oral TID WC  . sodium chloride flush  3 mL Intravenous Once  . sulfamethoxazole-trimethoprim  1 tablet Oral Daily   Continuous Infusions: PRN Meds:.acetaminophen, cyclobenzaprine, heparin    Assessment and Plan: 1. ESRD- normally on TTS schedule but off schedule due to missed treatment on 05/30/20.  Will resume usual outpatient schedule when he is discharged to SNF 2. Disposition- for admission to SNF as soon as he is cleared by insurance.     Donetta Potts,  MD 06/03/2020, 8:28 AM

## 2020-06-03 NOTE — Progress Notes (Signed)
Inpatient Diabetes Program Recommendations  AACE/ADA: New Consensus Statement on Inpatient Glycemic Control (2015)  Target Ranges:  Prepandial:   less than 140 mg/dL      Peak postprandial:   less than 180 mg/dL (1-2 hours)      Critically ill patients:  140 - 180 mg/dL   Lab Results  Component Value Date   GLUCAP 67 (L) 06/03/2020    Review of Glycemic Control Results for Connor Hoover, Connor Hoover (MRN [id number]) as of 06/03/2020 14:11  Ref. Range 06/01/2020 06:26 06/01/2020 22:26 06/03/2020 06:46 06/03/2020 12:55  Glucose-Capillary Latest Ref Range: 70 - 99 mg/dL 107 (H) 160 (H) 131 (H) 67 (L)   Diabetes history: DM 2 Outpatient Diabetes medications:  Novolog 5 units tid with meals, Lantus 30 units q HS Current orders for Inpatient glycemic control:  Lantus 30 units q HS, Novolog 5 units tid with meals Inpatient Diabetes Program Recommendations:    Please consider reducing Lantus to 15 units q HS.  Also consider d/c of Novolog meal coverage tid with meals.  Also consider adding Novolog (0-6 units) tid with meals.   Thanks,  Adah Perl, RN, BC-ADM Inpatient Diabetes Coordinator Pager [phone] (8a-5p)

## 2020-06-03 NOTE — ED Notes (Signed)
Lunch Tray Ordered @ 1219-per Janett Billow, RN ordered by Levada Dy

## 2020-06-03 NOTE — Progress Notes (Signed)
PT Cancellation Note  Patient Details Name: Connor Hoover MRN: [id number] DOB: 24-Sep-1946   Cancelled Treatment:    Reason Eval/Treat Not Completed: Patient at procedure or test/unavailable. Pt is currently in HD. Will check back as able to continue with PT POC.    Thelma Comp 06/03/2020, 10:47 AM   Rolinda Roan, PT, DPT Acute Rehabilitation Services Pager: [phone] Office: [phone]

## 2020-06-03 NOTE — Progress Notes (Addendum)
1:45pm: CSW notified [name] at [facility] that the patient's COVID test resulted as negative.  9am: CSW spoke with patient's wife to discuss details for transportation to and from dialysis once the patient is at the facility - wife is agreeable. The patient receives HD at [facility] on [address] on Tuesdays, Thursdays, and Saturdays with a chair time of 11:10am.  CSW spoke with [name] at [facility] who states authorization has been obtained.   The patient will go to room 114. The patient will be transported via [place]. The number to call for report is [phone].  [name], MSW, LCSW-A Transitions of Care  Clinical Social Worker  [facility] Emergency Departments  Medical ICU [phone]

## 2020-06-03 NOTE — ED Notes (Signed)
Report Called to RN at [facility]

## 2020-09-23 ENCOUNTER — Encounter (HOSPITAL_COMMUNITY): Payer: Self-pay

## 2020-09-23 ENCOUNTER — Inpatient Hospital Stay (HOSPITAL_COMMUNITY)
Admission: EM | Admit: 2020-09-23 | Discharge: 2020-09-25 | DRG: 291 | Disposition: A | Discharge status: Home Health | Payer: No Typology Code available for payment source | Attending: Internal Medicine | Admitting: Internal Medicine

## 2020-09-23 ENCOUNTER — Observation Stay (HOSPITAL_COMMUNITY): Payer: No Typology Code available for payment source

## 2020-09-23 ENCOUNTER — Other Ambulatory Visit: Payer: Self-pay

## 2020-09-23 DIAGNOSIS — R531 Weakness: Secondary | ICD-10-CM

## 2020-09-23 DIAGNOSIS — I472 Ventricular tachycardia: Secondary | ICD-10-CM | POA: Diagnosis present

## 2020-09-23 DIAGNOSIS — I252 Old myocardial infarction: Secondary | ICD-10-CM

## 2020-09-23 DIAGNOSIS — E1151 Type 2 diabetes mellitus with diabetic peripheral angiopathy without gangrene: Secondary | ICD-10-CM | POA: Diagnosis present

## 2020-09-23 DIAGNOSIS — I5023 Acute on chronic systolic (congestive) heart failure: Secondary | ICD-10-CM | POA: Diagnosis present

## 2020-09-23 DIAGNOSIS — L03116 Cellulitis of left lower limb: Secondary | ICD-10-CM

## 2020-09-23 DIAGNOSIS — E669 Obesity, unspecified: Secondary | ICD-10-CM | POA: Diagnosis present

## 2020-09-23 DIAGNOSIS — R0600 Dyspnea, unspecified: Secondary | ICD-10-CM | POA: Diagnosis not present

## 2020-09-23 DIAGNOSIS — E8889 Other specified metabolic disorders: Secondary | ICD-10-CM | POA: Diagnosis present

## 2020-09-23 DIAGNOSIS — Z79899 Other long term (current) drug therapy: Secondary | ICD-10-CM

## 2020-09-23 DIAGNOSIS — J449 Chronic obstructive pulmonary disease, unspecified: Secondary | ICD-10-CM | POA: Diagnosis present

## 2020-09-23 DIAGNOSIS — G4733 Obstructive sleep apnea (adult) (pediatric): Secondary | ICD-10-CM | POA: Diagnosis present

## 2020-09-23 DIAGNOSIS — I132 Hypertensive heart and chronic kidney disease with heart failure and with stage 5 chronic kidney disease, or end stage renal disease: Secondary | ICD-10-CM | POA: Diagnosis not present

## 2020-09-23 DIAGNOSIS — Z6841 Body Mass Index (BMI) 40.0 and over, adult: Secondary | ICD-10-CM

## 2020-09-23 DIAGNOSIS — Z992 Dependence on renal dialysis: Secondary | ICD-10-CM

## 2020-09-23 DIAGNOSIS — F4310 Post-traumatic stress disorder, unspecified: Secondary | ICD-10-CM | POA: Diagnosis present

## 2020-09-23 DIAGNOSIS — Z801 Family history of malignant neoplasm of trachea, bronchus and lung: Secondary | ICD-10-CM

## 2020-09-23 DIAGNOSIS — Z9862 Peripheral vascular angioplasty status: Secondary | ICD-10-CM

## 2020-09-23 DIAGNOSIS — N2581 Secondary hyperparathyroidism of renal origin: Secondary | ICD-10-CM | POA: Diagnosis present

## 2020-09-23 DIAGNOSIS — Z89412 Acquired absence of left great toe: Secondary | ICD-10-CM

## 2020-09-23 DIAGNOSIS — R21 Rash and other nonspecific skin eruption: Secondary | ICD-10-CM | POA: Diagnosis present

## 2020-09-23 DIAGNOSIS — Z87891 Personal history of nicotine dependence: Secondary | ICD-10-CM

## 2020-09-23 DIAGNOSIS — Z7982 Long term (current) use of aspirin: Secondary | ICD-10-CM

## 2020-09-23 DIAGNOSIS — N186 End stage renal disease: Secondary | ICD-10-CM

## 2020-09-23 DIAGNOSIS — E1122 Type 2 diabetes mellitus with diabetic chronic kidney disease: Secondary | ICD-10-CM | POA: Diagnosis present

## 2020-09-23 DIAGNOSIS — I953 Hypotension of hemodialysis: Secondary | ICD-10-CM | POA: Diagnosis present

## 2020-09-23 DIAGNOSIS — Z794 Long term (current) use of insulin: Secondary | ICD-10-CM

## 2020-09-23 DIAGNOSIS — Z20822 Contact with and (suspected) exposure to covid-19: Secondary | ICD-10-CM | POA: Diagnosis present

## 2020-09-23 DIAGNOSIS — D649 Anemia, unspecified: Secondary | ICD-10-CM | POA: Diagnosis present

## 2020-09-23 LAB — COMPREHENSIVE METABOLIC PANEL
ALT: 25 U/L (ref 0–44)
AST: 58 U/L — ABNORMAL HIGH (ref 15–41)
Albumin: 3.1 g/dL — ABNORMAL LOW (ref 3.5–5.0)
Alkaline Phosphatase: 164 U/L — ABNORMAL HIGH (ref 38–126)
Anion gap: 16 — ABNORMAL HIGH (ref 5–15)
BUN: 35 mg/dL — ABNORMAL HIGH (ref 8–23)
CO2: 24 mmol/L (ref 22–32)
Calcium: 9.4 mg/dL (ref 8.9–10.3)
Chloride: 92 mmol/L — ABNORMAL LOW (ref 98–111)
Creatinine, Ser: 9.51 mg/dL — ABNORMAL HIGH (ref 0.61–1.24)
GFR, Estimated: 5 mL/min — ABNORMAL LOW (ref >60)
Glucose, Bld: 133 mg/dL — ABNORMAL HIGH (ref 70–99)
Potassium: 5 mmol/L (ref 3.5–5.1)
Sodium: 132 mmol/L — ABNORMAL LOW (ref 135–145)
Total Bilirubin: 1.3 mg/dL — ABNORMAL HIGH (ref 0.3–1.2)
Total Protein: 7 g/dL (ref 6.5–8.1)

## 2020-09-23 LAB — CBC
HCT: 31.6 % — ABNORMAL LOW (ref 39.0–52.0)
Hemoglobin: 10.4 g/dL — ABNORMAL LOW (ref 13.0–17.0)
MCH: 31.2 pg (ref 26.0–34.0)
MCHC: 32.9 g/dL (ref 30.0–36.0)
MCV: 94.9 fL (ref 80.0–100.0)
Platelets: 249 10*3/uL (ref 150–400)
RBC: 3.33 MIL/uL — ABNORMAL LOW (ref 4.22–5.81)
RDW: 16.1 % — ABNORMAL HIGH (ref 11.5–15.5)
WBC: 11.6 10*3/uL — ABNORMAL HIGH (ref 4.0–10.5)
nRBC: 0 % (ref 0.0–0.2)

## 2020-09-23 LAB — HEMOGLOBIN A1C
Hgb A1c MFr Bld: 7.2 % — ABNORMAL HIGH (ref 4.8–5.6)
Mean Plasma Glucose: 159.94 mg/dL

## 2020-09-23 LAB — RESPIRATORY PANEL BY RT PCR (FLU A&B, COVID)
Influenza A by PCR: NEGATIVE
Influenza B by PCR: NEGATIVE
SARS Coronavirus 2 by RT PCR: NEGATIVE

## 2020-09-23 LAB — GLUCOSE, CAPILLARY: Glucose-Capillary: 155 mg/dL — ABNORMAL HIGH (ref 70–99)

## 2020-09-23 MED ORDER — ACETAMINOPHEN 325 MG PO TABS: 650.0000 mg | ORAL_TABLET | Freq: Four times a day (QID) | ORAL | Status: DC | PRN

## 2020-09-23 MED ORDER — CINACALCET HCL 30 MG PO TABS
30.0000 mg | ORAL_TABLET | Freq: Every day | ORAL | Status: DC
  Administered 2020-09-23 – 2020-09-24 (×2): 30 mg via ORAL
  Filled 2020-09-23 (×3): qty 1

## 2020-09-23 MED ORDER — VANCOMYCIN HCL IN DEXTROSE 1-5 GM/200ML-% IV SOLN
1000.0000 mg | Freq: Once | INTRAVENOUS | Status: AC
  Administered 2020-09-23: 1000 mg via INTRAVENOUS
  Filled 2020-09-23: qty 200

## 2020-09-23 MED ORDER — FLUTICASONE FUROATE-VILANTEROL 100-25 MCG/INH IN AEPB: 1.0000 | INHALATION_SPRAY | Freq: Every day | RESPIRATORY_TRACT | Status: DC

## 2020-09-23 MED ORDER — ALBUTEROL SULFATE (2.5 MG/3ML) 0.083% IN NEBU: 2.5000 mg | INHALATION_SOLUTION | Freq: Four times a day (QID) | RESPIRATORY_TRACT | Status: DC | PRN

## 2020-09-23 MED ORDER — INSULIN ASPART 100 UNIT/ML ~~LOC~~ SOLN
0.0000 [IU] | Freq: Three times a day (TID) | SUBCUTANEOUS | Status: DC
  Administered 2020-09-23 – 2020-09-25 (×4): 1 [IU] via SUBCUTANEOUS

## 2020-09-23 MED ORDER — BACITRACIN ZINC 500 UNIT/GM EX OINT
1.0000 "application " | TOPICAL_OINTMENT | Freq: Every day | CUTANEOUS | Status: DC
  Filled 2020-09-23: qty 28.4

## 2020-09-23 MED ORDER — ATORVASTATIN CALCIUM 40 MG PO TABS
40.0000 mg | ORAL_TABLET | Freq: Every day | ORAL | Status: DC
  Administered 2020-09-24: 40 mg via ORAL
  Filled 2020-09-23: qty 1

## 2020-09-23 MED ORDER — DOCUSATE SODIUM 50 MG PO CAPS
50.0000 mg | ORAL_CAPSULE | Freq: Two times a day (BID) | ORAL | Status: DC
  Administered 2020-09-24 – 2020-09-25 (×3): 50 mg via ORAL
  Filled 2020-09-23 (×5): qty 1

## 2020-09-23 MED ORDER — POLYETHYLENE GLYCOL 3350 17 G PO PACK: 17.0000 g | PACK | Freq: Every day | ORAL | Status: DC | PRN

## 2020-09-23 MED ORDER — ONDANSETRON HCL 4 MG/2ML IJ SOLN
4.0000 mg | Freq: Four times a day (QID) | INTRAMUSCULAR | Status: DC | PRN
  Administered 2020-09-25: 4 mg via INTRAVENOUS
  Filled 2020-09-23 (×2): qty 2

## 2020-09-23 MED ORDER — INSULIN GLARGINE 100 UNIT/ML ~~LOC~~ SOLN
20.0000 [IU] | Freq: Every day | SUBCUTANEOUS | Status: DC
  Administered 2020-09-24: 20 [IU] via SUBCUTANEOUS
  Filled 2020-09-23 (×3): qty 0.2

## 2020-09-23 MED ORDER — HEPARIN SODIUM (PORCINE) 5000 UNIT/ML IJ SOLN
5000.0000 [IU] | Freq: Three times a day (TID) | INTRAMUSCULAR | Status: DC
  Administered 2020-09-24 – 2020-09-25 (×5): 5000 [IU] via SUBCUTANEOUS
  Filled 2020-09-23 (×5): qty 1

## 2020-09-23 MED ORDER — INSULIN ASPART 100 UNIT/ML ~~LOC~~ SOLN
4.0000 [IU] | Freq: Three times a day (TID) | SUBCUTANEOUS | Status: DC
  Administered 2020-09-24 – 2020-09-25 (×4): 4 [IU] via SUBCUTANEOUS

## 2020-09-23 MED ORDER — INSULIN GLARGINE 100 UNIT/ML ~~LOC~~ SOLN: 24.0000 [IU] | Freq: Every day | SUBCUTANEOUS | Status: DC

## 2020-09-23 MED ORDER — ACETAMINOPHEN 325 MG PO TABS: 650.0000 mg | ORAL_TABLET | Freq: Three times a day (TID) | ORAL | Status: DC | PRN

## 2020-09-23 MED ORDER — SEVELAMER CARBONATE 800 MG PO TABS
1600.0000 mg | ORAL_TABLET | Freq: Three times a day (TID) | ORAL | Status: DC
  Administered 2020-09-23 – 2020-09-25 (×6): 1600 mg via ORAL
  Filled 2020-09-23 (×7): qty 2

## 2020-09-23 MED ORDER — CHLORHEXIDINE GLUCONATE CLOTH 2 % EX PADS: 6.0000 | MEDICATED_PAD | Freq: Every day | CUTANEOUS | Status: DC

## 2020-09-23 MED ORDER — CLOPIDOGREL BISULFATE 75 MG PO TABS
75.0000 mg | ORAL_TABLET | Freq: Every day | ORAL | Status: DC
  Administered 2020-09-24 – 2020-09-25 (×2): 75 mg via ORAL
  Filled 2020-09-23 (×2): qty 1

## 2020-09-23 MED ORDER — METOPROLOL SUCCINATE ER 25 MG PO TB24: 12.5000 mg | ORAL_TABLET | Freq: Every day | ORAL | Status: DC

## 2020-09-23 MED ORDER — ONDANSETRON HCL 4 MG PO TABS: 4.0000 mg | ORAL_TABLET | Freq: Four times a day (QID) | ORAL | Status: DC | PRN

## 2020-09-23 MED ORDER — ASPIRIN 81 MG PO CHEW
81.0000 mg | CHEWABLE_TABLET | Freq: Every day | ORAL | Status: DC
  Administered 2020-09-24 – 2020-09-25 (×2): 81 mg via ORAL
  Filled 2020-09-23 (×2): qty 1

## 2020-09-23 MED ORDER — MIDODRINE HCL 5 MG PO TABS
10.0000 mg | ORAL_TABLET | Freq: Three times a day (TID) | ORAL | Status: DC
  Administered 2020-09-23 – 2020-09-25 (×7): 10 mg via ORAL
  Filled 2020-09-23 (×8): qty 2

## 2020-09-23 MED ORDER — PIPERACILLIN-TAZOBACTAM 3.375 G IVPB 30 MIN
3.3750 g | Freq: Once | INTRAVENOUS | Status: AC
  Administered 2020-09-23: 3.375 g via INTRAVENOUS
  Filled 2020-09-23: qty 50

## 2020-09-23 MED ORDER — LAMOTRIGINE 100 MG PO TABS
150.0000 mg | ORAL_TABLET | Freq: Every day | ORAL | Status: DC
  Administered 2020-09-24 – 2020-09-25 (×2): 150 mg via ORAL
  Filled 2020-09-23: qty 1
  Filled 2020-09-23 (×2): qty 2

## 2020-09-23 MED ORDER — FLUTICASONE FUROATE-VILANTEROL 100-25 MCG/INH IN AEPB
1.0000 | INHALATION_SPRAY | Freq: Every day | RESPIRATORY_TRACT | Status: DC
  Administered 2020-09-24 – 2020-09-25 (×2): 1 via RESPIRATORY_TRACT
  Filled 2020-09-23: qty 28

## 2020-09-23 MED ORDER — ALBUTEROL SULFATE HFA 108 (90 BASE) MCG/ACT IN AERS: 2.0000 | INHALATION_SPRAY | Freq: Four times a day (QID) | RESPIRATORY_TRACT | Status: DC | PRN

## 2020-09-23 MED ORDER — RENAL MULTIVITAMIN/ZINC PO TABS
ORAL_TABLET | Freq: Every day | ORAL | Status: DC
Start: 2020-09-23 — End: 2020-09-23

## 2020-09-23 MED ORDER — HYDROMORPHONE HCL 2 MG PO TABS
2.0000 mg | ORAL_TABLET | Freq: Four times a day (QID) | ORAL | Status: DC | PRN
  Administered 2020-09-24 – 2020-09-25 (×4): 2 mg via ORAL
  Filled 2020-09-23 (×4): qty 1

## 2020-09-23 MED ORDER — RENA-VITE PO TABS
1.0000 | ORAL_TABLET | Freq: Every day | ORAL | Status: DC
  Administered 2020-09-24: 1 via ORAL
  Filled 2020-09-23 (×2): qty 1

## 2020-09-23 NOTE — ED Notes (Signed)
Patient and patients family member requested information not be disclosed to anyone but the patients spouse. Also requested that patients room number and room phone number not be disclosed to anyone at this time.

## 2020-09-23 NOTE — Progress Notes (Signed)
Pt admitted with wound vac from home , however when attempting to reconnect suction rn discovered the low battery alarm was sounding and machine cut off.  stat wound care consult put in and MD notified  Wife contacted to bring charger  but no answer with phone call  Will continue to monitor

## 2020-09-23 NOTE — ED Provider Notes (Signed)
[facility] EMERGENCY DEPARTMENT Provider Note   CSN: [id number] Arrival date & time: 09/23/20  1101     History Chief Complaint  Patient presents with  . Weakness    Connor Hoover is a 74 y.o. male.  Patient with hx esrd/hd, normally w HD T/TH/S, last dialyzed 4 days ago, was supposed to go to HD today but in past 3-4 days with generalized weakness, too weak to walk, so was unable to go to dialysis. EMS was called and transported to ED. Patient indicates a week ago had left toe amputation at [facility] and was d/c to home 4 days ago. Since hospital d/c generally weak. No focal or unilateral numbness/weakness. No change in speech or vision. No fever or chills. No chest pain or discomfort. No sob. No cough or uri symptoms. No abd pain or nvd.   The history is provided by the patient and the EMS personnel.  Weakness Associated symptoms: no abdominal pain, no chest pain, no cough, no diarrhea, no dysuria, no fever, no headaches, no shortness of breath and no vomiting        Past Medical History:  Diagnosis Date  . Diabetes mellitus without complication ([place])   . Hypertension   . Peritoneal dialysis status ([place])   . PTSD (post-traumatic stress disorder)     Patient Active Problem List   Diagnosis Date Noted  . DIABETES MELLITUS, TYPE II, ON INSULIN 11/30/2009  . HYPERLIPIDEMIA 11/30/2009  . OBESITY 11/30/2009  . HYPERTENSION 11/30/2009  . 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 CAD, NATIVE VESSEL 11/30/2009  . GERD 11/30/2009  . RENAL INSUFFICIENCY 11/30/2009  . TOBACCO USE, QUIT 11/30/2009    Past Surgical History:  Procedure Laterality Date  . CARDIAC SURGERY         History reviewed. No pertinent family history.  Social History   Tobacco Use  . Smoking status: Former Research scientist (life sciences)  . Smokeless tobacco: Former Network engineer  . Vaping Use: Never used  Substance Use Topics  . Alcohol use: Not Currently  . Drug use: Not  Currently    Home Medications Prior to Admission medications   Medication Sig Start Date End Date Taking? Authorizing Provider  acetaminophen (TYLENOL) 325 MG tablet Take 650 mg by mouth every 6 (six) hours as needed for mild pain or fever.    [provider]  aspirin 81 MG chewable tablet Chew 81 mg by mouth daily.    [provider]  atorvastatin (LIPITOR) 80 MG tablet Take 40 mg by mouth at bedtime.     [provider]  B Complex-C-Folic Acid (B COMPLEX-VITAMIN C-FOLIC ACID) 1 MG tablet Take 1 tablet by mouth daily. 10/17/17   [provider]  cinacalcet (SENSIPAR) 30 MG tablet Take 30 mg by mouth daily. With largest meal    [provider]  cyclobenzaprine (FLEXERIL) 5 MG tablet Take 5 mg by mouth 3 (three) times daily as needed for muscle spasms.    [provider]  guaiFENesin (GUAIFENEX LA) 600 MG 12 hr tablet Take 600 mg by mouth every 8 (eight) hours as needed (breathing).    [provider]  insulin aspart (NOVOLOG) 100 UNIT/ML injection Inject 5 Units into the skin 3 (three) times daily before meals.     [provider]  insulin glargine (LANTUS) 100 UNIT/ML injection Inject 30 Units into the skin at bedtime.     [provider]  lamoTRIgine (LAMICTAL) 150 MG tablet Take 150  mg by mouth daily.    [provider]  lidocaine (LMX) 4 % cream Apply 1 application topically as needed. Patient not taking: Reported on 05/30/2020 09/15/19   Arlean Hopping C, PA-C  lidocaine-prilocaine (EMLA) cream Apply 1 application topically 3 (three) times a week. 05/28/20   [provider]  midodrine (PROAMATINE) 5 MG tablet Take 10 mg by mouth 3 (three) times daily with meals.    [provider]  Multiple Vitamin (RENAL MULTIVITAMIN/ZINC PO) Take 1 tablet by mouth daily with supper.    [provider]  sevelamer carbonate (RENVELA) 800 MG tablet Take 1,600 mg by mouth 3 (three) times daily with  meals.    [provider]  sulfamethoxazole-trimethoprim (BACTRIM DS) 800-160 MG tablet Take 1 tablet by mouth 2 (two) times daily. 05/25/20   [provider]    Allergies    Lisinopril and Povidine [povidone iodine]  Review of Systems   Review of Systems  Constitutional: Negative for chills, diaphoresis and fever.  HENT: Negative for sore throat.   Eyes: Negative for redness.  Respiratory: Negative for cough and shortness of breath.   Cardiovascular: Negative for chest pain.  Gastrointestinal: Negative for abdominal pain, diarrhea and vomiting.  Genitourinary: Negative for dysuria and flank pain.  Musculoskeletal: Negative for back pain and neck pain.  Skin: Negative for rash.  Neurological: Positive for weakness. Negative for speech difficulty, numbness and headaches.  Hematological: Does not bruise/bleed easily.  Psychiatric/Behavioral: Negative for confusion.    Physical Exam Updated Vital Signs Pulse 90   Temp 98.6 F (37 C) (Oral)   Resp 20   SpO2 100%   Physical Exam Vitals and nursing note reviewed.  Constitutional:      Appearance: Normal appearance. He is well-developed.  HENT:     Head: Atraumatic.     Nose: Nose normal.     Mouth/Throat:     Mouth: Mucous membranes are moist.     Pharynx: Oropharynx is clear.  Eyes:     General: No scleral icterus.    Conjunctiva/sclera: Conjunctivae normal.  Neck:     Trachea: No tracheal deviation.  Cardiovascular:     Rate and Rhythm: Normal rate and regular rhythm.     Pulses: Normal pulses.     Heart sounds: Normal heart sounds. No murmur heard.  No friction rub. No gallop.   Pulmonary:     Effort: Pulmonary effort is normal. No accessory muscle usage or respiratory distress.     Breath sounds: Normal breath sounds.  Abdominal:     General: Bowel sounds are normal. There is no distension.     Palpations: Abdomen is soft.     Tenderness: There is no abdominal tenderness. There is no guarding.       Comments: Obese.  Genitourinary:    Comments: No cva tenderness. Musculoskeletal:     Cervical back: Normal range of motion and neck supple. No rigidity.     Comments: Wound vac on base of left great toe. Chronic skin changes to bilateral lower legs. Left lower leg with mild erythema and increased warmth - ?early/mild cellulitis.   Skin:    General: Skin is warm and dry.     Findings: No rash.  Neurological:     Mental Status: He is alert.     Comments: Alert, speech clear. Motor/sens grossly intact bil.   Psychiatric:        Mood and Affect: Mood normal.     ED Results / Procedures /  Treatments   Labs (all labs ordered are listed, but only abnormal results are displayed) Results for orders placed or performed during the hospital encounter of 09/23/20  Respiratory Panel by RT PCR (Flu A&B, Covid) - Nasopharyngeal Swab   Specimen: Nasopharyngeal Swab; Nasopharyngeal(NP) swabs in vial transport medium  Result Value Ref Range   SARS Coronavirus 2 by RT PCR NEGATIVE NEGATIVE   Influenza A by PCR NEGATIVE NEGATIVE   Influenza B by PCR NEGATIVE NEGATIVE  CBC  Result Value Ref Range   WBC 11.6 (H) 4.0 - 10.5 K/uL   RBC 3.33 (L) 4.22 - 5.81 MIL/uL   Hemoglobin 10.4 (L) 13.0 - 17.0 g/dL   HCT 31.6 (L) 39 - 52 %   MCV 94.9 80.0 - 100.0 fL   MCH 31.2 26.0 - 34.0 pg   MCHC 32.9 30.0 - 36.0 g/dL   RDW 16.1 (H) 11.5 - 15.5 %   Platelets 249 150 - 400 K/uL   nRBC 0.0 0.0 - 0.2 %  Comprehensive metabolic panel  Result Value Ref Range   Sodium 132 (L) 135 - 145 mmol/L   Potassium 5.0 3.5 - 5.1 mmol/L   Chloride 92 (L) 98 - 111 mmol/L   CO2 24 22 - 32 mmol/L   Glucose, Bld 133 (H) 70 - 99 mg/dL   BUN 35 (H) 8 - 23 mg/dL   Creatinine, Ser 9.51 (H) 0.61 - 1.24 mg/dL   Calcium 9.4 8.9 - 10.3 mg/dL   Total Protein 7.0 6.5 - 8.1 g/dL   Albumin 3.1 (L) 3.5 - 5.0 g/dL   AST 58 (H) 15 - 41 U/L   ALT 25 0 - 44 U/L   Alkaline Phosphatase 164 (H) 38 - 126 U/L   Total Bilirubin 1.3 (H)  0.3 - 1.2 mg/dL   GFR, Estimated 5 (L) >60 mL/min   Anion gap 16 (H) 5 - 15    EKG EKG Interpretation  Date/Time:  Wednesday September 23 2020 11:42:51 EST Ventricular Rate:  86 PR Interval:    QRS Duration: 202 QT Interval:  491 QTC Calculation: 588 R Axis:   -77 Text Interpretation: Sinus rhythm RBBB and LAFB Confirmed by Lajean Saver [phone]) on 09/23/2020 11:51:57 AM   Radiology No results found.  Procedures Procedures (including critical care time)  Medications Ordered in ED Medications - No data to display  ED Course  I have reviewed the triage vital signs and the nursing notes.  Pertinent labs & imaging results that were available during my care of the patient were reviewed by me and considered in my medical decision making (see chart for details).    MDM Rules/Calculators/A&P                         Iv ns. Continuous pulse ox and cardiac monitoring. Labs sent.   Reviewed nursing notes and prior charts for additional history.   Labs reviewed/interpreted by me - wbc mildly elevated.   Renal consulted re general weakness, last dialysis four days ago, need for dialysis.   Given too weak to walk, suspected new cellulitis left lower leg, dialysis, will consult medicine for admission.   Discussed w renal - will arrange hd today.  Discussed with internal medicine - will admit.   Pt/family updated on plan.      Final Clinical Impression(s) / ED Diagnoses Final diagnoses:  None    Rx / DC Orders ED Discharge Orders    None  Lajean Saver, MD 09/23/20 [phone]

## 2020-09-23 NOTE — Progress Notes (Signed)
Pt refused BiPAP for the night.

## 2020-09-23 NOTE — Consult Note (Signed)
[facility] Renal Consultation Note    Indication for Consultation:  Management of ESRD/hemodialysis, anemia, hypertension/volume, and secondary hyperparathyroidism.  HPI: Connor Hoover is a 74 y.o. male with PMH including ESRD on dialysis TTS, T2DM, and HTN. He was just discharged from [place] in [place], last HD was Saturday. He was supposed to go to his outpatient dialysis center today but was too weak for his wife to get him to treatment. Labs notable for WBC 11.6, Hgb 10.4, K+ 5.0, BUN 35, Cr 9.51. VSS. COVID 19 negative. Cellulitis of the left lower extremity suspected in the ED. Nephrology has been consulted for management of ESRD/ dialysis.   Pt examined in the ED. Reports he feels well other than being extremely weak. HGe felt ok when he left the hospital but has become progressively weaker since then.  Specifically complains that he is unable to walk, but says weakness is "all over." Denies pain in his foot. No fever, chills. Denies SOB, orthopnea, cough, CP, palpitaitons, abdominal pain, N/V/D. Makes very little urine.   Past Medical History:  Diagnosis Date  . Diabetes mellitus without complication ([place])   . Hypertension   . Peritoneal dialysis status ([place])   . PTSD (post-traumatic stress disorder)    Past Surgical History:  Procedure Laterality Date  . CARDIAC SURGERY     History reviewed. No pertinent family history. Social History:  reports that he has quit smoking. He has quit using smokeless tobacco. He reports previous alcohol use. He reports previous drug use.  ROS: As per HPI otherwise negative.  Physical Exam: Vitals:   09/23/20 1300 09/23/20 1315 09/23/20 1321 09/23/20 1322  BP: 110/72 115/80    Pulse:  83    Resp: 13 19 17 15   Temp:      TempSrc:      SpO2:  98%       General: Well developed, alert, in no acute distress. Head: Normocephalic, atraumatic, sclera non-icteric, mucus membranes are moist. Neck: JVD not elevated. Lungs: Clear  bilaterally to auscultation anteriorly without wheezes, rales, or rhonchi. Breathing is unlabored. Heart: RRR with normal S1, S2. No murmurs, rubs, or gallops appreciated. Abdomen: Soft, non-tender, non-distended with normoactive bowel sounds. No rebound/guarding. No obvious abdominal masses. Musculoskeletal:  Strength and tone appear normal for age. 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 Psych:  Responds to questions appropriately with a normal affect. Dialysis Access: LUE AVF + bruit  Allergies  Allergen Reactions  . Lisinopril Swelling    Made the tongue swell and resulted in a trip to the ER  . Povidine [Povidone Iodine] Itching   Prior to Admission medications   Medication Sig Start Date End Date Taking? Authorizing Provider  acetaminophen (TYLENOL) 325 MG tablet Take 650 mg by mouth every 6 (six) hours as needed for mild pain or fever.   Yes [provider]  albuterol (VENTOLIN HFA) 108 (90 Base) MCG/ACT inhaler Inhale 2 puffs into the lungs every 6 (six) hours as needed for wheezing or shortness of breath.   Yes [provider]  ammonium lactate (LAC-HYDRIN) 12 % lotion Apply 1 application topically daily.   Yes [provider]  aspirin 81 MG chewable tablet Chew 81 mg by mouth daily.   Yes [provider]  atorvastatin (LIPITOR) 80 MG tablet Take 40 mg by mouth at bedtime.    Yes [provider]  B  Complex-C-Folic Acid (B COMPLEX-VITAMIN C-FOLIC ACID) 1 MG tablet Take 1 tablet by mouth daily. 10/17/17  Yes [provider]  bacitracin 500 UNIT/GM ointment Apply 1 application topically See admin instructions. Apply small amount topically every day for the first week. Only apply after cleaning and before applying gauze dressing. Discontinue after first week.   Yes [provider]  cinacalcet  (SENSIPAR) 30 MG tablet Take 30 mg by mouth daily. With largest meal   Yes [provider]  clopidogrel (PLAVIX) 75 MG tablet Take 75 mg by mouth daily.   Yes [provider]  clotrimazole (LOTRIMIN) 1 % cream Apply 1 application topically daily.   Yes [provider]  docusate sodium (COLACE) 50 MG capsule Take 50 mg by mouth 2 (two) times daily.   Yes [provider]  Fluticasone-Salmeterol (ADVAIR) 100-50 MCG/DOSE AEPB Inhale 1 puff into the lungs 2 (two) times daily.   Yes [provider]  hydrocerin (EUCERIN) CREA Apply 1 application topically 2 (two) times daily as needed.   Yes [provider]  HYDROmorphone (DILAUDID) 2 MG tablet Take 2 mg by mouth every 6 (six) hours as needed for moderate pain or severe pain.   Yes [provider]  insulin glargine (LANTUS) 100 UNIT/ML injection Inject 30 Units into the skin at bedtime.    Yes [provider]  insulin NPH-regular Human (70-30) 100 UNIT/ML injection Inject 4 Units into the skin in the morning, at noon, and at bedtime.   Yes [provider]  ipratropium-albuterol (DUONEB) 0.5-2.5 (3) MG/3ML SOLN Take 3 mLs by nebulization in the morning and at bedtime.   Yes [provider]  lamoTRIgine (LAMICTAL) 150 MG tablet Take 150 mg by mouth daily.   Yes [provider]  lidocaine (LMX) 4 % cream Apply 1 application topically as needed. Patient taking differently: Apply 1 application topically 2 (two) times daily as needed.  09/15/19  Yes Joy, Shawn C, PA-C  metoprolol succinate (TOPROL-XL) 25 MG 24 hr tablet Take 12.5 mg by mouth daily.   Yes [provider]  midodrine (PROAMATINE) 5 MG tablet Take 10 mg by mouth 3 (three) times daily with meals.   Yes [provider]  Multiple Vitamin (RENAL MULTIVITAMIN/ZINC PO) Take 1 tablet by mouth daily with supper.   Yes [provider]  sevelamer carbonate (RENVELA) 800 MG tablet Take  1,600 mg by mouth 3 (three) times daily with meals.   Yes [provider]  triamcinolone (KENALOG) 0.1 % Apply 1 application topically 2 (two) times daily.   Yes [provider]   Current Facility-Administered Medications  Medication Dose Route Frequency Provider Last Rate Last Admin  . piperacillin-tazobactam (ZOSYN) IVPB 3.375 g  3.375 g Intravenous Once Lajean Saver, MD      . vancomycin (VANCOCIN) IVPB 1000 mg/200 mL premix  1,000 mg Intravenous Once Lajean Saver, MD       Current Outpatient Medications  Medication Sig Dispense Refill  . acetaminophen (TYLENOL) 325 MG tablet Take 650 mg by mouth every 6 (six) hours as needed for mild pain or fever.    Marland Kitchen albuterol (VENTOLIN HFA) 108 (90 Base) MCG/ACT inhaler Inhale 2 puffs into the lungs every 6 (six) hours as needed for wheezing or shortness of breath.    Marland Kitchen ammonium lactate (LAC-HYDRIN) 12 % lotion Apply 1 application topically daily.    Marland Kitchen aspirin 81 MG chewable tablet Chew 81 mg by mouth daily.    Marland Kitchen atorvastatin (LIPITOR) 80  MG tablet Take 40 mg by mouth at bedtime.     . B Complex-C-Folic Acid (B COMPLEX-VITAMIN C-FOLIC ACID) 1 MG tablet Take 1 tablet by mouth daily.    . bacitracin 500 UNIT/GM ointment Apply 1 application topically See admin instructions. Apply small amount topically every day for the first week. Only apply after cleaning and before applying gauze dressing. Discontinue after first week.    . cinacalcet (SENSIPAR) 30 MG tablet Take 30 mg by mouth daily. With largest meal    . clopidogrel (PLAVIX) 75 MG tablet Take 75 mg by mouth daily.    . clotrimazole (LOTRIMIN) 1 % cream Apply 1 application topically daily.    Marland Kitchen docusate sodium (COLACE) 50 MG capsule Take 50 mg by mouth 2 (two) times daily.    . Fluticasone-Salmeterol (ADVAIR) 100-50 MCG/DOSE AEPB Inhale 1 puff into the lungs 2 (two) times daily.    . hydrocerin (EUCERIN) CREA Apply 1 application topically 2 (two) times daily as needed.    Marland Kitchen  HYDROmorphone (DILAUDID) 2 MG tablet Take 2 mg by mouth every 6 (six) hours as needed for moderate pain or severe pain.    Marland Kitchen insulin glargine (LANTUS) 100 UNIT/ML injection Inject 30 Units into the skin at bedtime.     . insulin NPH-regular Human (70-30) 100 UNIT/ML injection Inject 4 Units into the skin in the morning, at noon, and at bedtime.    Marland Kitchen ipratropium-albuterol (DUONEB) 0.5-2.5 (3) MG/3ML SOLN Take 3 mLs by nebulization in the morning and at bedtime.    . lamoTRIgine (LAMICTAL) 150 MG tablet Take 150 mg by mouth daily.    Marland Kitchen lidocaine (LMX) 4 % cream Apply 1 application topically as needed. (Patient taking differently: Apply 1 application topically 2 (two) times daily as needed. ) 30 g 0  . metoprolol succinate (TOPROL-XL) 25 MG 24 hr tablet Take 12.5 mg by mouth daily.    . midodrine (PROAMATINE) 5 MG tablet Take 10 mg by mouth 3 (three) times daily with meals.    . Multiple Vitamin (RENAL MULTIVITAMIN/ZINC PO) Take 1 tablet by mouth daily with supper.    . sevelamer carbonate (RENVELA) 800 MG tablet Take 1,600 mg by mouth 3 (three) times daily with meals.    . triamcinolone (KENALOG) 0.1 % Apply 1 application topically 2 (two) times daily.     Labs: Basic Metabolic Panel: Recent Labs  Lab 09/23/20 1119  NA 132*  K 5.0  CL 92*  CO2 24  GLUCOSE 133*  BUN 35*  CREATININE 9.51*  CALCIUM 9.4   Liver Function Tests: Recent Labs  Lab 09/23/20 1119  AST 58*  ALT 25  ALKPHOS 164*  BILITOT 1.3*  PROT 7.0  ALBUMIN 3.1*   CBC: Recent Labs  Lab 09/23/20 1119  WBC 11.6*  HGB 10.4*  HCT 31.6*  MCV 94.9  PLT 249   Outpatient Dialysis Orders:  Center: [facility]  on TTS. 4:15hr, BFR 450, DFR 800, EDW 116kg, 2K/2Ca. AVF 15g, heparin 8000 unit bolus  Assessment/Plan: 1.  Weakness: Progressive weakness after d/c from [place] with L great toe amputation. Cellulitis suspected, started on empiric antibiotics. 2.  ESRD:  Dialyzes TTS, last HD was Saturday.  Missed one HD but  BUN/Cr not significantly elevated compared to baseline. Appears slightly volume overloaded on exam. Will plan for HD today, will most likely be later tonight.  3.  Hypertension/volume: BP controlled. Mildly volume overloaded as above. Will need standing weights to better assess EDW once patient is  able.  4.  Anemia: Hgb 10.4. No ESA indicated at this time.  5.  Metabolic bone disease: Corrected calcium 10.1. Not on VDRA, using low Ca bath with HD. Continue renvela and sensipar.  6.  Nutrition:  Alb low, will add on protein supplement. Needs renal diet/fluid restrictions.   Anice Paganini, PA-C 09/23/2020, 1:29 PM  [facility] Pager: [phone]

## 2020-09-23 NOTE — H&P (Signed)
Date: 09/23/2020               Patient Name:  Connor Hoover MRN: [id number]  DOB: 1945-11-25 Age / Sex: 74 y.o., male   PCP: [facility] [place] Service: Internal Medicine Teaching Service         Attending Physician: Dr. Lajean Saver, MD    First Contact: Dr. Honor Junes Pager: [phone]  Second Contact: Dr. [name] Pager: [phone]       After Hours (After 5p/  First Contact Pager: [phone]  weekends / holidays): Second Contact Pager: [phone]   Chief Complaint: Weakness  History of Present Illness:  Connor Hoover is a 74 yo M w/ PMH of ESRD Tu,Thu,Sa, DM, HFrEF (EF 30% 2020), PAD s/p angioplasty, recent osteomyelitis of L great toe s/p amputation presenting to [facility] with weakness. He was in his usual state of health until about November 17th and an in-grown toenail that became infected and he was admitted. He was found to have osteomyelitis of his L great toe requiring amputation. After the surgery he was discharged home with home nursing on 11/21. He mentions  episode of fall on the day prior to his discharge as well as endorsing significant weakness at discharge requiring three people assistance to get out of the car. At home, he continued to endorse NBNB vomiting and weakness. He was scheduled to go to dialysis on Monday (changed from Tu/Thu/Sa schedule due to holiday schedule) but he was unable to attend as he was too fatigue. He slipped out of his bed this morning due to worsening weakness this morning when attempting to go to dialysis and EMS was called.  Per chart review, from discharge summary, Connor Hoover had admission from 09/16/20 to 09/20/20 for L toe osteomyelitis. During this admission, he also was found to have worsening EF on echocardiogram, as well as PAD on his LLE requiring angioplasty. He was also noted to be hypotensive during dialysis. At discharge, he was started on multiple new medications including metoprolol succinate, clopidogrel and  midodrine.  Meds:  Current Meds  Medication Sig  . acetaminophen (TYLENOL) 325 MG tablet Take 650 mg by mouth every 6 (six) hours as needed for mild pain or fever.  Marland Kitchen albuterol (VENTOLIN HFA) 108 (90 Base) MCG/ACT inhaler Inhale 2 puffs into the lungs every 6 (six) hours as needed for wheezing or shortness of breath.  Marland Kitchen ammonium lactate (LAC-HYDRIN) 12 % lotion Apply 1 application topically daily.  Marland Kitchen aspirin 81 MG chewable tablet Chew 81 mg by mouth daily.  Marland Kitchen atorvastatin (LIPITOR) 80 MG tablet Take 40 mg by mouth at bedtime.   . B Complex-C-Folic Acid (B COMPLEX-VITAMIN C-FOLIC ACID) 1 MG tablet Take 1 tablet by mouth daily.  . bacitracin 500 UNIT/GM ointment Apply 1 application topically See admin instructions. Apply small amount topically every day for the first week. Only apply after cleaning and before applying gauze dressing. Discontinue after first week.  . cinacalcet (SENSIPAR) 30 MG tablet Take 30 mg by mouth daily. With largest meal  . clopidogrel (PLAVIX) 75 MG tablet Take 75 mg by mouth daily.  . clotrimazole (LOTRIMIN) 1 % cream Apply 1 application topically daily.  Marland Kitchen docusate sodium (COLACE) 50 MG capsule Take 50 mg by mouth 2 (two) times daily.  . Fluticasone-Salmeterol (ADVAIR) 100-50 MCG/DOSE AEPB Inhale 1 puff into the lungs 2 (two) times daily.  . hydrocerin (EUCERIN) CREA Apply 1 application topically 2 (two)  times daily as needed.  Marland Kitchen HYDROmorphone (DILAUDID) 2 MG tablet Take 2 mg by mouth every 6 (six) hours as needed for moderate pain or severe pain.  Marland Kitchen insulin glargine (LANTUS) 100 UNIT/ML injection Inject 30 Units into the skin at bedtime.   . insulin NPH-regular Human (70-30) 100 UNIT/ML injection Inject 4 Units into the skin in the morning, at noon, and at bedtime.  Marland Kitchen ipratropium-albuterol (DUONEB) 0.5-2.5 (3) MG/3ML SOLN Take 3 mLs by nebulization in the morning and at bedtime.  . lamoTRIgine (LAMICTAL) 150 MG tablet Take 150 mg by mouth daily.  Marland Kitchen lidocaine (LMX) 4 %  cream Apply 1 application topically as needed. (Patient taking differently: Apply 1 application topically 2 (two) times daily as needed. )  . metoprolol succinate (TOPROL-XL) 25 MG 24 hr tablet Take 12.5 mg by mouth daily.  . midodrine (PROAMATINE) 5 MG tablet Take 10 mg by mouth 3 (three) times daily with meals.  . Multiple Vitamin (RENAL MULTIVITAMIN/ZINC PO) Take 1 tablet by mouth daily with supper.  . sevelamer carbonate (RENVELA) 800 MG tablet Take 1,600 mg by mouth 3 (three) times daily with meals.  . triamcinolone (KENALOG) 0.1 % Apply 1 application topically 2 (two) times daily.   Allergies: Allergies as of 09/23/2020 - Review Complete 09/23/2020  Allergen Reaction Noted  . Lisinopril Swelling 11/04/2016  . Povidine [povidone iodine] Itching 08/23/2018   Past Medical History:  Diagnosis Date  . Diabetes mellitus without complication ([place])   . Hypertension   . Peritoneal dialysis status ([place])   . PTSD (post-traumatic stress disorder)    Family History:  Mother had lung cancer. No other clinically relevant family history  Social History: Lives with wife who assist with most daily activities. Ambulates with walker at baseline. Denies alcohol, tobacco, illicit substance use   Review of Systems: A complete ROS was negative except as per HPI.   Physical Exam: Blood pressure 115/80, pulse 83, temperature 98.6 F (37 C), temperature source Oral, resp. rate 15, SpO2 98 %.  Gen: Well-developed, chronically ill-appearing, somnolent HEENT: NCAT head, hearing intact, EOMI, MMM Neck: supple, ROM intact, unable to assess JVD CV: RRR, S1, S2 normal, No rubs, no murmurs, no gallops Pulm: CTAB, Bibasilar rales up to mid thorax Abd: Soft, BS+, NTND, Abdominal wall edema Extm: L foot wound vac on and functioning with sanguinous drainage, Peripheral pulses faint but present, 2+ edema up to thighs. LUE graft site with + bruit Skin: Dry, Warm, normal turgor, chronic venous stasis  changes Neuro: AAOx3, somnolent  EKG: personally reviewed my interpretation is junctional rhythm with increased QRS duration, no ischemic changes, similar to prior ekg  CXR: Pending  Assessment & Plan by Problem: Active Problems:   * No active hospital problems. *  Mr.Becknell is a 74 yo M w/ PMH of ESRD Tu,Thu,Sa, DM, OSA, HFrEF (EF 30% 2020), PAD s/p angioplasty, recent osteomyelitis of L great toe s/p amputation presenting with weakness with acute on chronic systolic heart failure in setting of ESRD after missing his regularly scheduled dialysis session.  Generalized weakness Acute on chronic systolic heart failure ESRD on TuThuSa (MWF for holiday week) Prior Echo in 2020 with EF of 30%. Follows with Dr.Benshimhon and [facility]. Used to get peritoneal dialysis, now getting hemodialysis. Last dialysis session 09/19/20. Missed 1 session on Monday. Presenting with generalized weakness and significant hypervolemia on exam. Last dry weight 120.2kg Had recent hospitalization at [facility]. Likely deconditioning from recent hospitalization also contributing. Will need PT/OT eval when  euvolemic. Nephro on board and planning dialysis today. - Appreciate nephro assistance - HD today - PT/OT eval - Trend electrolytes - C/w home meds: metoprolol succinate 12.5mg  daily, midodrine 10mg  with dialysis  L great toe osteomyelitis s/p amputation S/p amputation on recent admission at [facility]. Discharge summary show no further antibiotics prescribed. Wound vac in place and functioning. No purulent drainage, surrounding warmth but is tender to palpation and edematous. Given dose of vanc, zosyn for leukocytosis (wbc 11.6) but exam not consistent with surgical site infection. Likely normal post-operative changes. Currently afebrile. Will not continue antibiotics - D/c vanc, zosyn - Wound care consult - Monitor vitals - Trend cbc - If febrile, can get blood culture, imaging, resume abx - Resume home meds:  Oral dilaudid prn for pain  Peripheral Arterial Disease Discharge summary describes angioplasty performed in LLE during recent admission. Unclear which vessel. Diminished but palpable pulses on exam. ON DAPT for 30 days with aspirin, plavix (until 10/20/20). - C/w home meds: aspirin 81mg  daily, clopidogrel 75mg  daily, atorvastatin 40mg  daily  OSA Confirmed via sleep study in July - C/w CPAP  T2DM On lantus 26 units qhs, 6 units NPH. Current glucose 133 - Lantus 20 units qhs - Novolog 4 units TID qc - SSI, glucose checks  COPD - C/w home meds: albuterol, duoneb prn, fluticasone/solumedrol  DVT prophx: subqhep Diet: Renal Bowel: Senokot Code: Full  Prior to Admission Living Arrangement: Home Anticipated Discharge Location: Home Barriers to Discharge: Medical Work-up  Dispo: Admit patient to Observation with expected length of stay less than 2 midnights.  Signed: Mosetta Anis, MD 09/23/2020, 4:36 PM Pager: [phone] After 5pm on weekdays and 1pm on weekends: On Call Pager: [phone]

## 2020-09-23 NOTE — ED Triage Notes (Signed)
Pt BIB EMS from home due to increase weakness. Pt was d/c from [place] on Saturday due to foot surgery on left big toe. Pt has wound vac in place. Pt is also dialysis pt on [facility]. Due to holidays pt only had dialysis on Saturday. Pt was suppose to go yesterday and today but due to the increase weakness didn't go, Pt access on left upper arm.Pt is A&o x4 on arrival.

## 2020-09-24 DIAGNOSIS — E8889 Other specified metabolic disorders: Secondary | ICD-10-CM | POA: Diagnosis present

## 2020-09-24 DIAGNOSIS — R531 Weakness: Secondary | ICD-10-CM | POA: Diagnosis not present

## 2020-09-24 DIAGNOSIS — N186 End stage renal disease: Secondary | ICD-10-CM

## 2020-09-24 DIAGNOSIS — J449 Chronic obstructive pulmonary disease, unspecified: Secondary | ICD-10-CM | POA: Diagnosis present

## 2020-09-24 DIAGNOSIS — Z87891 Personal history of nicotine dependence: Secondary | ICD-10-CM | POA: Diagnosis not present

## 2020-09-24 DIAGNOSIS — E1151 Type 2 diabetes mellitus with diabetic peripheral angiopathy without gangrene: Secondary | ICD-10-CM | POA: Diagnosis present

## 2020-09-24 DIAGNOSIS — N2581 Secondary hyperparathyroidism of renal origin: Secondary | ICD-10-CM | POA: Diagnosis present

## 2020-09-24 DIAGNOSIS — Z6841 Body Mass Index (BMI) 40.0 and over, adult: Secondary | ICD-10-CM | POA: Diagnosis not present

## 2020-09-24 DIAGNOSIS — I132 Hypertensive heart and chronic kidney disease with heart failure and with stage 5 chronic kidney disease, or end stage renal disease: Secondary | ICD-10-CM | POA: Diagnosis present

## 2020-09-24 DIAGNOSIS — M869 Osteomyelitis, unspecified: Secondary | ICD-10-CM | POA: Diagnosis not present

## 2020-09-24 DIAGNOSIS — F4310 Post-traumatic stress disorder, unspecified: Secondary | ICD-10-CM | POA: Diagnosis present

## 2020-09-24 DIAGNOSIS — Z89412 Acquired absence of left great toe: Secondary | ICD-10-CM | POA: Diagnosis not present

## 2020-09-24 DIAGNOSIS — E1122 Type 2 diabetes mellitus with diabetic chronic kidney disease: Secondary | ICD-10-CM | POA: Diagnosis present

## 2020-09-24 DIAGNOSIS — I953 Hypotension of hemodialysis: Secondary | ICD-10-CM | POA: Diagnosis present

## 2020-09-24 DIAGNOSIS — I5023 Acute on chronic systolic (congestive) heart failure: Secondary | ICD-10-CM | POA: Diagnosis present

## 2020-09-24 DIAGNOSIS — R0600 Dyspnea, unspecified: Secondary | ICD-10-CM | POA: Diagnosis present

## 2020-09-24 DIAGNOSIS — Z7982 Long term (current) use of aspirin: Secondary | ICD-10-CM | POA: Diagnosis not present

## 2020-09-24 DIAGNOSIS — Z9862 Peripheral vascular angioplasty status: Secondary | ICD-10-CM | POA: Diagnosis not present

## 2020-09-24 DIAGNOSIS — I5033 Acute on chronic diastolic (congestive) heart failure: Secondary | ICD-10-CM | POA: Diagnosis not present

## 2020-09-24 DIAGNOSIS — G4733 Obstructive sleep apnea (adult) (pediatric): Secondary | ICD-10-CM

## 2020-09-24 DIAGNOSIS — E669 Obesity, unspecified: Secondary | ICD-10-CM | POA: Diagnosis present

## 2020-09-24 DIAGNOSIS — I472 Ventricular tachycardia: Secondary | ICD-10-CM | POA: Diagnosis present

## 2020-09-24 DIAGNOSIS — D649 Anemia, unspecified: Secondary | ICD-10-CM | POA: Diagnosis present

## 2020-09-24 DIAGNOSIS — I739 Peripheral vascular disease, unspecified: Secondary | ICD-10-CM

## 2020-09-24 DIAGNOSIS — I252 Old myocardial infarction: Secondary | ICD-10-CM | POA: Diagnosis not present

## 2020-09-24 DIAGNOSIS — Z79899 Other long term (current) drug therapy: Secondary | ICD-10-CM | POA: Diagnosis not present

## 2020-09-24 DIAGNOSIS — Z20822 Contact with and (suspected) exposure to covid-19: Secondary | ICD-10-CM | POA: Diagnosis present

## 2020-09-24 DIAGNOSIS — R21 Rash and other nonspecific skin eruption: Secondary | ICD-10-CM | POA: Diagnosis present

## 2020-09-24 DIAGNOSIS — Z992 Dependence on renal dialysis: Secondary | ICD-10-CM | POA: Diagnosis not present

## 2020-09-24 LAB — RENAL FUNCTION PANEL
Albumin: 2.8 g/dL — ABNORMAL LOW (ref 3.5–5.0)
Anion gap: 16 — ABNORMAL HIGH (ref 5–15)
BUN: 18 mg/dL (ref 8–23)
CO2: 25 mmol/L (ref 22–32)
Calcium: 9 mg/dL (ref 8.9–10.3)
Chloride: 95 mmol/L — ABNORMAL LOW (ref 98–111)
Creatinine, Ser: 5.89 mg/dL — ABNORMAL HIGH (ref 0.61–1.24)
GFR, Estimated: 10 mL/min — ABNORMAL LOW (ref >60)
Glucose, Bld: 124 mg/dL — ABNORMAL HIGH (ref 70–99)
Phosphorus: 4.1 mg/dL (ref 2.5–4.6)
Potassium: 3.9 mmol/L (ref 3.5–5.1)
Sodium: 136 mmol/L (ref 135–145)

## 2020-09-24 LAB — CBC WITH DIFFERENTIAL/PLATELET
Abs Immature Granulocytes: 0.08 10*3/uL — ABNORMAL HIGH (ref 0.00–0.07)
Basophils Absolute: 0.1 10*3/uL (ref 0.0–0.1)
Basophils Relative: 2 %
Eosinophils Absolute: 0.6 10*3/uL — ABNORMAL HIGH (ref 0.0–0.5)
Eosinophils Relative: 7 %
HCT: 29.7 % — ABNORMAL LOW (ref 39.0–52.0)
Hemoglobin: 10 g/dL — ABNORMAL LOW (ref 13.0–17.0)
Immature Granulocytes: 1 %
Lymphocytes Relative: 10 %
Lymphs Abs: 0.8 10*3/uL (ref 0.7–4.0)
MCH: 31.2 pg (ref 26.0–34.0)
MCHC: 33.7 g/dL (ref 30.0–36.0)
MCV: 92.5 fL (ref 80.0–100.0)
Monocytes Absolute: 0.8 10*3/uL (ref 0.1–1.0)
Monocytes Relative: 9 %
Neutro Abs: 6.3 10*3/uL (ref 1.7–7.7)
Neutrophils Relative %: 71 %
Platelets: 225 10*3/uL (ref 150–400)
RBC: 3.21 MIL/uL — ABNORMAL LOW (ref 4.22–5.81)
RDW: 15.9 % — ABNORMAL HIGH (ref 11.5–15.5)
WBC: 8.7 10*3/uL (ref 4.0–10.5)
nRBC: 0 % (ref 0.0–0.2)

## 2020-09-24 LAB — GLUCOSE, CAPILLARY
Glucose-Capillary: 114 mg/dL — ABNORMAL HIGH (ref 70–99)
Glucose-Capillary: 124 mg/dL — ABNORMAL HIGH (ref 70–99)
Glucose-Capillary: 119 mg/dL — ABNORMAL HIGH (ref 70–99)
Glucose-Capillary: 134 mg/dL — ABNORMAL HIGH (ref 70–99)
Glucose-Capillary: 113 mg/dL — ABNORMAL HIGH (ref 70–99)

## 2020-09-24 LAB — HEPATITIS B SURFACE ANTIGEN: Hepatitis B Surface Ag: NONREACTIVE

## 2020-09-24 LAB — MRSA PCR SCREENING: MRSA by PCR: NEGATIVE

## 2020-09-24 MED ORDER — METOPROLOL SUCCINATE ER 25 MG PO TB24
12.5000 mg | ORAL_TABLET | Freq: Every day | ORAL | Status: DC
  Administered 2020-09-25: 12.5 mg via ORAL
  Filled 2020-09-24: qty 1

## 2020-09-24 MED ORDER — CAMPHOR-MENTHOL 0.5-0.5 % EX LOTN
TOPICAL_LOTION | CUTANEOUS | Status: DC | PRN
  Administered 2020-09-24: 1 via TOPICAL
  Filled 2020-09-24: qty 222

## 2020-09-24 MED ORDER — SALINE SPRAY 0.65 % NA SOLN
1.0000 | NASAL | Status: DC | PRN
  Filled 2020-09-24: qty 44

## 2020-09-24 NOTE — Progress Notes (Signed)
Patient was noted when sitting oob in chair with wide complex rhythm start wheezing really bad started with dusky face discoloration not responding to writer-we called rapid response he improved with o2 application and when he came out of the wide complex rhythm also. He remains on zoll monitor at bedside with shock pads attached till we hear from the MD.

## 2020-09-24 NOTE — Progress Notes (Signed)
Teaching service called back talked with Dr.Sawyer Mentzer the after hours coverage call center he reported would be up to see patient. Wife remains at bedside reported was very pleased with our quick response with her husband and he looks better.

## 2020-09-24 NOTE — Progress Notes (Signed)
   09/24/20 0052  Vitals  Temp 97.8 F (36.6 C)  Temp Source Oral  BP 102/64  MAP (mmHg) 77  BP Method Automatic  Pulse Rate 85  Pulse Rate Source Monitor  Resp 18  MEWS COLOR  MEWS Score Color Green  Oxygen Therapy  SpO2 94 %  O2 Device Room Air  MEWS Score  MEWS Temp 0  MEWS Systolic 0  MEWS Pulse 0  MEWS RR 0  MEWS LOC 0  MEWS Score 0  Pt arrived from dialysis via bed, alert and oriented, vital signs stable.

## 2020-09-24 NOTE — Progress Notes (Signed)
  [facility] Progress Note   Assessment/ Plan:   Outpatient Dialysis Orders:  Center: [place]  on TTS. 4:15hr, BFR 450, DFR 800, EDW 116kg, 2K/2Ca. AVF 15g, heparin 8000 unit bolus  Assessment/Plan: 1.  Weakness: Progressive weakness after d/c from [place] with L great toe amputation. Cellulitis suspected, started on empiric antibiotics.  Improved today after dialysis 2.  ESRD:  Dialyzes TTS, last HD was Saturday.  Missed one HD but BUN/Cr not significantly elevated compared to baseline. Appears slightly volume overloaded on exam. HD Wednesday, next planned for Saturday.  3.  Hypertension/volume: BP controlled. Mildly volume overloaded as above. Will need standing weights to better assess EDW once patient is able.  4.  Anemia: Hgb 10.4. No ESA indicated at this time.  5.  Metabolic bone disease: Corrected calcium 10.1. Not on VDRA, using low Ca bath with HD. Continue renvela and sensipar.  6.  Nutrition:  Alb low, will add on protein supplement. Needs renal diet/fluid restrictions  Subjective:    No acute events overnight.  HD yesterday with 1L off.  Reports itching R leg.   Objective:   BP 107/73 (BP Location: Right Arm)   Pulse 86   Temp 99 F (37.2 C) (Oral)   Resp 18   Wt 123.4 kg   SpO2 99%   BMI 43.25 kg/m   Physical Exam: General: NAD Neck: JVD not elevated. Lungs: Clear bilaterally to auscultation anteriorly without wheezes, rales, or rhonchi. Breathing is unlabored. Heart: RRR with normal S1, S2. No murmurs, rubs, or gallops appreciated. Abdomen: Soft, non-tender, non-distended with normoactive bowel sounds. No rebound/guarding. No obvious abdominal masses. Lower extremities: L great toe amputation with wound vac, b/l lower extremities with dry/slightly erythematous skin R >L  and 1+ woody edema Neuro: Alert and oriented X 3. Dialysis Access: LUE AVF + bruit  Labs: BMET Recent Labs  Lab 09/23/20 1119 09/24/20 0248  NA 132* 136  K 5.0 3.9  CL  92* 95*  CO2 24 25  GLUCOSE 133* 124*  BUN 35* 18  CREATININE 9.51* 5.89*  CALCIUM 9.4 9.0  PHOS  --  4.1   CBC Recent Labs  Lab 09/23/20 1119 09/24/20 0248  WBC 11.6* 8.7  NEUTROABS  --  6.3  HGB 10.4* 10.0*  HCT 31.6* 29.7*  MCV 94.9 92.5  PLT 249 225      Medications:    . aspirin  81 mg Oral Daily  . atorvastatin  40 mg Oral QHS  . bacitracin  1 application Topical QHS  . Chlorhexidine Gluconate Cloth  6 each Topical Q0600  . cinacalcet  30 mg Oral Q supper  . clopidogrel  75 mg Oral Daily  . docusate sodium  50 mg Oral BID  . fluticasone furoate-vilanterol  1 puff Inhalation Daily  . heparin  5,000 Units Subcutaneous Q8H  . insulin aspart  0-9 Units Subcutaneous TID WC  . insulin aspart  4 Units Subcutaneous TID WC  . insulin glargine  20 Units Subcutaneous QHS  . lamoTRIgine  150 mg Oral Daily  . [START ON 09/25/2020] metoprolol succinate  12.5 mg Oral Daily  . midodrine  10 mg Oral TID WC  . multivitamin  1 tablet Oral QHS  . sevelamer carbonate  1,600 mg Oral TID WC     Madelon Lips, MD 09/24/2020, 10:38 AM

## 2020-09-24 NOTE — Significant Event (Signed)
Rapid Response Event Note   Reason for Call :  About 3 min of WCT rate 150s.  Patient became unresponsive per staff.  When they placed patient on zoll he was in [place].  He then became nausea  Initial Focused Assessment:  Patient sitting in the chair he has vomited sm amount in the basin.  He is alert and oriented, states he is nauseated.  Zoll pads in place regular rhythm 80s. Assisted back to bed patient stood with walker and pivoted to sit in the bed.  He states that his right leg is painful, but currently is left foot is pain free.  Lung sounds clear, decreased bases Heart tones regular  AM labs: K+ 3.9  No recent magnesium result  Interventions:  12 lead EKG:  Right BBB   Cato Mulligan MD at bedside to assess patient  Plan of Care:  Continue to monitor.  Notify MD if further ectopy   Event Summary:   MD Notified: initially paged daytime resident who saw patient this am,  No return call. paged IMTS after hours:   Call Time: [place] Time: 1310 End Time: [place]  Raliegh Ip, RN

## 2020-09-24 NOTE — Evaluation (Signed)
Occupational Therapy Evaluation Patient Details Name: Connor Hoover MRN: [id number] DOB: 1946/07/28 Today's Date: 09/24/2020    History of Present Illness 74 yo M w/ PMH of ESRD Tu,Thu,Sa, DM, HFrEF (EF 30% 2020), PAD s/p angioplasty, recent osteomyelitis of L great toe s/p amputation presenting to [facility] with weakness.   Clinical Impression   Until his toe amputation surgery at the [place] last week, pt was independent in mobility and self care. Since return home on 09/20/20 he has been dependent in bathing, dressing and toileting and gradually unable to walk. Pt presents with baseline memory impairment,  L LE pain and impaired standing balance. He requires set up to total assist for ADL and min to min guard assist for transfers. Will follow acutely. Recommend HHOT upon discharge.   Follow Up Recommendations  Home health OT;Supervision/Assistance - 24 hour    Equipment Recommendations  3 in 1 bedside commode (bariatric )    Recommendations for Other Services       Precautions / Restrictions Precautions Precautions: Fall Precaution Comments: LLE wound vac Required Braces or Orthoses: Other Brace Other Brace: DARCO shoe Restrictions Weight Bearing Restrictions: Yes LLE Weight Bearing:  (through heel)      Mobility Bed Mobility      General bed mobility comments: pt seated at EOB upon arrival    Transfers Overall transfer level: Needs assistance Equipment used: Rolling walker (2 wheeled) Transfers: Sit to/from [employer] Sit to Stand: Min assist;Min guard (minA from bed) Stand pivot transfers: Min guard       General transfer comment: minG from recliner with armrests    Balance Overall balance assessment: Needs assistance Sitting-balance support: No upper extremity supported;Feet supported Sitting balance-Leahy Scale: Fair     Standing balance support: Bilateral upper extremity supported Standing balance-Leahy Scale: Poor Standing balance comment:  reliant on UE support and close supervision                           ADL either performed or assessed with clinical judgement   ADL Overall ADL's : Needs assistance/impaired Eating/Feeding: Independent   Grooming: Set up;Sitting   Upper Body Bathing: Minimal assistance;Sitting   Lower Body Bathing: Total assistance;Sit to/from stand   Upper Body Dressing : Set up;Sitting   Lower Body Dressing: Total assistance;Sit to/from stand   Toilet Transfer: Min guard;Stand-pivot;RW;BSC   Toileting- Water quality scientist and Hygiene: Total assistance;Sit to/from stand               Vision Patient Visual Report: No change from baseline       Perception     Praxis      Pertinent Vitals/Pain Pain Assessment: Faces Faces Pain Scale: Hurts little more Pain Location: LLE Pain Descriptors / Indicators: Grimacing Pain Intervention(s): Monitored during session     Hand Dominance Right   Extremity/Trunk Assessment Upper Extremity Assessment Upper Extremity Assessment: Overall WFL for tasks assessed   Lower Extremity Assessment Lower Extremity Assessment: Defer to PT evaluation   Cervical / Trunk Assessment Cervical / Trunk Assessment: Other exceptions Cervical / Trunk Exceptions: excess body habitus   Communication Communication Communication: No difficulties   Cognition Arousal/Alertness: Awake/alert Behavior During Therapy: WFL for tasks assessed/performed Overall Cognitive Status: Impaired/Different from baseline Area of Impairment: Memory                     Memory: Decreased short-term memory         General Comments: pt with  poor recall of WB precautions and of DARCO shoe   General Comments  VSS on RA    Exercises     Shoulder Instructions      Home Living Family/patient expects to be discharged to:: Private residence Living Arrangements: Spouse/significant other Available Help at Discharge: Family;Available 24 hours/day Type of  Home: House Home Access: Stairs to enter [employer] of Steps: 4 Entrance Stairs-Rails: None (pt reports some rails at back steps) Home Layout: One level     Bathroom Shower/Tub: Teacher, early years/pre: Handicapped height     Home Equipment: Environmental consultant - 4 wheels;Walker - 2 wheels;Bedside commode          Prior Functioning/Environment Level of Independence: Independent with assistive device(s)        Comments: walking with either walker, assisted for all ADL since toe amputation        OT Problem List: Decreased activity tolerance;Impaired balance (sitting and/or standing);Decreased strength;Decreased knowledge of use of DME or AE;Pain;Obesity      OT Treatment/Interventions: Self-care/ADL training;DME and/or AE instruction;Therapeutic activities;Patient/family education;Balance training    OT Goals(Current goals can be found in the care plan section) Acute Rehab OT Goals Patient Stated Goal: To return to prior level of function and go home OT Goal Formulation: With patient Time For Goal Achievement: 10/08/20 Potential to Achieve Goals: Good ADL Goals Pt Will Perform Grooming: with min guard assist;standing Pt Will Transfer to Toilet: with min guard assist;ambulating;bedside commode (over toilet) Pt Will Perform Toileting - Clothing Manipulation and hygiene: with min assist;sit to/from stand Additional ADL Goal #1: Pt will be knowledgeable in use of AE for LB ADL.  OT Frequency: Min 2X/week   Barriers to D/C:            Co-evaluation PT/OT/SLP Co-Evaluation/Treatment: Yes Reason for Co-Treatment: Complexity of the patient's impairments (multi-system involvement) PT goals addressed during session: Mobility/safety with mobility;Balance;Proper use of DME;Strengthening/ROM OT goals addressed during session: ADL's and self-care;Proper use of Adaptive equipment and DME      AM-PAC OT "6 Clicks" Daily Activity     Outcome Measure Help from another  person eating meals?: None Help from another person taking care of personal grooming?: A Little Help from another person toileting, which includes using toliet, bedpan, or urinal?: Total Help from another person bathing (including washing, rinsing, drying)?: A Lot Help from another person to put on and taking off regular upper body clothing?: A Little Help from another person to put on and taking off regular lower body clothing?: Total 6 Click Score: 14   End of Session Equipment Utilized During Treatment: Rolling walker;Other (comment) (darco shoe) Nurse Communication: Mobility status (aware pt was left on [facility])  Activity Tolerance: Patient tolerated treatment well Patient left: Other (comment);with call bell/phone within reach (on [facility])  OT Visit Diagnosis: Unsteadiness on feet (R26.81);Other abnormalities of gait and mobility (R26.89);Pain;Other symptoms and signs involving cognitive function                Time: 1120-1135 OT Time Calculation (min): 15 min Charges:  OT General Charges $OT Visit: 1 Visit OT Evaluation $OT Eval Moderate Complexity: 1 Mod  Nestor Lewandowsky, OTR/L Acute Rehabilitation Services Pager: [phone] Office: [phone]  [name] 09/24/2020, 12:12 PM

## 2020-09-24 NOTE — Plan of Care (Signed)

## 2020-09-24 NOTE — Consult Note (Addendum)
[place] Nurse Consult Note:  Patient receiving care in [place]. Reason for Consult:  Home VAC to Left great toe surgical site. Wound type: surgical area, performed at the [facility] recently Pressure Injury POA: Yes/No/NA Measurement:  Patient would not agree to allow me to remove the VAC dressing.  He states he may go home today and if so, the [facility] RN can change the dressing when he gets home. For now, the non-[employer] home VAC appears to be working properly and is connected to a power source via the [facility] adapter. Wound bed:  I cannot determine by looking at the outside of the [facility] dressing if this is a foam filling a cavity, or if it is some type of incisional VAC dressing.  I do see at least 2 sutures to the medial aspect of the existing foam. Drainage (amount, consistency, odor) just a few drops of serosanginous in the VAC dressing tubing. Periwound: Drape is intact and suction seal is evident. Dressing procedure/placement/frequency: IF THE PATIENT IS NOT DISCHARGED 09/24/20, remove the VAC dressing from the left great toe. Place a Xeroform gauze Kellie Simmering [phone]) and wrap with kerlex.  Change the Xeroform daily.  Val Riles, RN, MSN, CWOCN, CNS-BC, pager [phone]

## 2020-09-24 NOTE — Progress Notes (Signed)
MD in room with patient talking with patient and family is reviewing the chart as appropriate. He remains NSR on tele monitor. Vs stable.

## 2020-09-24 NOTE — Progress Notes (Signed)
Subjective: HD#1 No acute overnight events.  This morning, patient is sitting up in bed and eating breakfast. He notes having significant weakness yesterday because he was unable to move his arms or legs and couldn't walk because of significant weakness.  He notes feeling better this morning and endorses that "recovered all limb functions" last night. However notes that he had a problem with breathing last night but didn't order CPAP overnight. He reports that the wound nurse came in this morning to change the woudn but patient endorses that it hurts too bad.   Endorses bilateral leg rash that has been there for 2-3 months for which he has been trying to see his PCP. However, has not been able to do so. Will evaluate with wound care.   Objective:  Vital signs in last 24 hours: Vitals:   09/24/20 0052 09/24/20 0130 09/24/20 0418 09/24/20 0814  BP: 102/64  (!) 98/59 107/73  Pulse: 85  85 86  Resp: 18  18 18   Temp: 97.8 F (36.6 C)  99 F (37.2 C)   TempSrc: Oral  Oral   SpO2: 94%  97% 97%  Weight:  123.4 kg     Physical exam  General: Well developed, chronically ill, elderly male sitting comfortably in bed, NAD  HEENT: [place]/NT, EOMI, MMM CV: RRR, S1, S2 normal, No m/r/g Pulmonary: CTAB, Bibasilar rales - Mid thorax Abdomen: Soft, BS+, no tenderness on palpation.  Extremities: Left foot wound-sanguinous drainage, Peripheral pulses faint  Skin: Chronic venous stasis changes. 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 Active Problems:   Acute on chronic systolic (congestive) heart failure (HCC)  Generalized weakness Acute on chronic systolic heart failure ESRD on  TuThuSa (MWF for holiday week) Patient had HD yesterday and had 1L output. Her weight today is 123.4 L. Last dry weight 120.2kg He looked euvolemic on examination but nephro will see him to decide further. Had recent hospitalization at [facility]. Likely deconditioning from recent hospitalization also contributing. Will need PT/OT eval when euvolemic.  - Appreciate nephro assistance - HD today - PT/OT eval - Trend electrolytes - C/w home meds: metoprolol succinate 12.5mg  daily, midodrine 10mg  with dialysis  L great toe osteomyelitis s/p amputation Patient states he felt better, regained all his limb functioning before egoing for dialysis yesterday. Feels better this morning.Asked for wound care and saw him this morning. He denied to take off his dressing and suggested to remove VAC dressing, place Xeroform guaze and wrap with Kerflex if not discharged today.  Currently afebrile. Will not continue antibiotics  - Monitor vitals - Trend cbc - If febrile, can get blood culture, imaging, resume abx - Resume home meds: Oral dilaudid prn for pain  Peripheral Arterial Disease Discharge summary describes angioplasty performed in LLE during recent admission. Unclear which vessel. Diminished but palpable pulses on exam. ON DAPT for 30 days with aspirin, plavix (until 10/20/20). - C/w home meds: aspirin 81mg  daily, clopidogrel 75mg  daily, atorvastatin 40mg  daily  OSA Confirmed via sleep study in July. Denies BiPAP last night.  - C/w CPAP  T2DM On lantus 26 units qhs, 6 units NPH. Current glucose 124. - Lantus 20 units qhs - Novolog 4 units TID qc -  SSI, glucose checks  COPD - C/w home meds: albuterol, duoneb prn, fluticasone/solumedrol  Prior to Admission Living Arrangement: Home Anticipated Discharge Location: Home Barriers to Discharge: Ongoing medical management Dispo: Anticipated discharge in approximately 1-2 day(s).   Honor Junes, MD 09/24/2020, 9:59 AM Pager:  [phone] After 5pm on weekdays and 1pm on weekends: On Call pager [phone]

## 2020-09-24 NOTE — Progress Notes (Signed)
Md requested new EKG it was done while patient was resting in the bed-see chart for results. MD reported no further intervention this for patient. Writer removed wound vac and performed left foot dressing change. New orders noted for Sarna lotion for patient's c/o itching.

## 2020-09-24 NOTE — Progress Notes (Signed)
MD paged to inform patient went into wide complex rhythm,writer entered room he stated was ok went out to tele monitor noted the wide complex rhythm went back to patient he was wheezing really bad not responding to me we called a rapid response on patient. After o2 applied he began have n/v,we gave him Zofran IVP this alleviated the vomiting. After a few minutes passed he stated was okay we returned him back to bed. He remained on the crash cart monitor at bedside. EKG performed. Wife at bedside aware of above. He is talking awake at present at rest in the bed. Vs stable.

## 2020-09-24 NOTE — Progress Notes (Signed)
Paged MD again await response. EKG with ectopical atrial rhythm with PACs with aberrant conduction with right BBB inferior infarct age undetermined anterolateral infract age undetermined abnormal EKG.

## 2020-09-24 NOTE — Evaluation (Signed)
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  Clinical Impression  Pt presents to PT with deficits in functional mobility, gait, balance, strength, power, endurance, sensation. Pt currently requires assistance to mobilize in bed, as well ad assistance for safety during transfers. Pt's activity tolerance is limited by pain somewhat, as well as poor musculoskeletal endurance. Pt will benefit from aggressive mobilization and PT POC to improve activity tolerance and strength. PT recommends discharge home with HHPT and assistance from spouse, as long as pt is able to progress to stair negotiation prior to discharge.    Follow Up Recommendations Home health PT;Supervision for mobility/OOB    Equipment Recommendations  None recommended by PT    Recommendations for Other Services       Precautions / Restrictions Precautions Precautions: Fall Precaution Comments: LLE wound vac Required Braces or Orthoses: Other Brace Other Brace: DARCO shoe Restrictions Weight Bearing Restrictions: Yes LLE Weight Bearing:  (assumed heel WB due to [facility] shoe)      Mobility  Bed Mobility Overal bed mobility: Needs Assistance Bed Mobility: Supine to Sit     Supine to sit: Min assist;HOB elevated     General bed mobility comments: use of rails    Transfers Overall transfer level: Needs assistance Equipment used: Rolling walker (2 wheeled) Transfers: Sit to/from [facility] Sit to Stand: Min assist;Min guard (minA from bed) Stand pivot transfers: Min guard       General transfer comment: minG from recliner with armrests  Ambulation/Gait Ambulation/Gait assistance: Min guard Gait Distance (Feet): 4  Feet Assistive device: Rolling walker (2 wheeled) Gait Pattern/deviations: Step-to pattern Gait velocity: reduced Gait velocity interpretation: <1.31 ft/sec, indicative of household ambulator General Gait Details: pt with short step-to gait, good maintenance of heel WB through LLE, increased trunk flexion  Stairs            Wheelchair Mobility    Modified Rankin (Stroke Patients Only)       Balance Overall balance assessment: Needs assistance Sitting-balance support: No upper extremity supported;Feet supported Sitting balance-Leahy Scale: Fair     Standing balance support: Bilateral upper extremity supported Standing balance-Leahy Scale: Poor Standing balance comment: reliant on UE support and close supervision                             Pertinent Vitals/Pain Pain Assessment: Faces Faces Pain Scale: Hurts little more Pain Location: LLE Pain Descriptors / Indicators: Grimacing Pain Intervention(s): Monitored during session    Home Living Family/patient expects to be discharged to:: Private residence Living Arrangements: Spouse/significant other Available Help at Discharge: Family;Available 24 hours/day Type of Home: House Home Access: Stairs to enter Entrance Stairs-Rails: None (pt reports some rails at back steps) Entrance Stairs-Number of Steps: 4 Home Layout: One level Home Equipment: Walker - 4 wheels;Walker - 2 wheels;Bedside commode      Prior Function Level of Independence: Independent with assistive device(s)         Comments: walking with either walker, assisted for all ADL since toe amputation     Hand Dominance   Dominant Hand: Right    Extremity/Trunk Assessment   Upper Extremity Assessment Upper Extremity Assessment: Defer to OT evaluation  Lower Extremity Assessment Lower Extremity Assessment: Generalized weakness (limited DF/PF LLE due to wound vac)    Cervical / Trunk Assessment Cervical / Trunk Assessment: Other  exceptions Cervical / Trunk Exceptions: excess body habitus  Communication   Communication: No difficulties  Cognition Arousal/Alertness: Awake/alert Behavior During Therapy: WFL for tasks assessed/performed Overall Cognitive Status: Impaired/Different from baseline Area of Impairment: Memory                     Memory: Decreased short-term memory         General Comments: pt with poor recall of WB precautions and of DARCO shoe      General Comments General comments (skin integrity, edema, etc.): VSS on RA    Exercises     Assessment/Plan    PT Assessment Patient needs continued PT services  PT Problem List Decreased strength;Decreased activity tolerance;Decreased balance;Decreased mobility;Decreased cognition;Decreased knowledge of use of DME;Decreased safety awareness;Decreased knowledge of precautions;Impaired sensation       PT Treatment Interventions DME instruction;Gait training;Stair training;Functional mobility training;Therapeutic activities;Therapeutic exercise;Balance training;Neuromuscular re-education;Cognitive remediation;Patient/family education    PT Goals (Current goals can be found in the Care Plan section)  Acute Rehab PT Goals Patient Stated Goal: To return to prior level of function and go home PT Goal Formulation: With patient Time For Goal Achievement: 10/08/20 Potential to Achieve Goals: Good    Frequency Min 3X/week   Barriers to discharge Inaccessible home environment      Co-evaluation PT/OT/SLP Co-Evaluation/Treatment: Yes Reason for Co-Treatment: Complexity of the patient's impairments (multi-system involvement);Necessary to address cognition/behavior during functional activity;To address functional/ADL transfers;For patient/therapist safety PT goals addressed during session: Mobility/safety with mobility;Balance;Proper use of DME;Strengthening/ROM         AM-PAC PT "6 Clicks" Mobility  Outcome Measure Help needed turning  from your back to your side while in a flat bed without using bedrails?: A Little Help needed moving from lying on your back to sitting on the side of a flat bed without using bedrails?: A Little Help needed moving to and from a bed to a chair (including a wheelchair)?: A Little Help needed standing up from a chair using your arms (e.g., wheelchair or bedside chair)?: A Little Help needed to walk in hospital room?: A Little Help needed climbing 3-5 steps with a railing? : A Lot 6 Click Score: 17    End of Session Equipment Utilized During Treatment: Other (comment) (DARCO shoe) Activity Tolerance: Patient tolerated treatment well Patient left: Other (comment);with call bell/phone within reach (on bedside commoden nurse tech made aware) Nurse Communication: Mobility status PT Visit Diagnosis: Other abnormalities of gait and mobility (R26.89);Unsteadiness on feet (R26.81);Muscle weakness (generalized) (M62.81)    Time: 1100-1135 PT Time Calculation (min) (ACUTE ONLY): 35 min   Charges:   PT Evaluation $PT Eval Moderate Complexity: 1 Mod          Zenaida Niece, PT, DPT Acute Rehabilitation Pager: [phone]   Zenaida Niece 09/24/2020, 11:49 AM

## 2020-09-24 NOTE — Progress Notes (Signed)
Received page by RN at 13:40. Returned call to RN who reported that patient had episode of wide-complex tachycardia (heart rate of 150s) from 13:04 to 13:07. Patient was reportedly unresponsive to questioning during this time and wheezing. Rapid response nurse was called and patient was placed on supplemental oxygen. He had one episode of small emesis and received IV zofran without recurrence of symptoms. Patient was placed on zoll monitor with regular rhythm in the 80s. EKG obtained which revealed possible ectopic atrial rhythm with PACs with Abberant conduction; RBBB; inferior infarct , age undetermined; Anterolateral infarct, age undetermined.   SUBJECTIVE: On IMTS arrival, patient reported no shortness of breath, palpitations, chest pain, nausea, lightheadedness or dizziness. His only complaint was pain in his bilateral legs and feet which is chronic for him.   OBJECTIVE: Vitals: Temp 97.6  RR 20  HR 80  BP 94/61  MAP 72  SpO2 100% on 2L  Physical examination: General - Comfortable-appearing man, lying in hospital bed in no acute distress. Respiratory - Normal respiratory effort. Lungs are clear to auscultation bilaterally CV - Normal rate, regular rhythm. Neuro - Alert and oriented to person, place, and time  Labs: Glu 11:53 - 119 RFP 03:00 - Na 136, K 3.9, Cl 95, CO2 25, Glu 124, BUN 18, Cr 5.89, Ca 9.0, Phos 4.1, Alb 2.8, GFR 10, Anion gap 16  ASSESSMENT/PLAN: Patient with history of HFrEF (EF 30% 2020), OSA, T2DM, ESRD T/R/Sa who was admitted for weakness found to have acute on chronic systolic heart failure exacerbation in the setting of missing dialysis. Patient's episode of wide complex tachycardia this afternoon has resolved without intervention. Patient is currently asymptomatic, hemodynamically stable, with no evidence of electrolyte derangement or other acute insult. Patient's home metoprolol has been held since admission in the setting of his SBP <110. We will continue to  monitor the patient for recurrence of symptoms/tachycardia.

## 2020-09-25 DIAGNOSIS — J449 Chronic obstructive pulmonary disease, unspecified: Secondary | ICD-10-CM

## 2020-09-25 DIAGNOSIS — I5023 Acute on chronic systolic (congestive) heart failure: Secondary | ICD-10-CM

## 2020-09-25 DIAGNOSIS — E119 Type 2 diabetes mellitus without complications: Secondary | ICD-10-CM

## 2020-09-25 LAB — BASIC METABOLIC PANEL
Anion gap: 17 — ABNORMAL HIGH (ref 5–15)
BUN: 27 mg/dL — ABNORMAL HIGH (ref 8–23)
CO2: 26 mmol/L (ref 22–32)
Calcium: 9.9 mg/dL (ref 8.9–10.3)
Chloride: 92 mmol/L — ABNORMAL LOW (ref 98–111)
Creatinine, Ser: 7.66 mg/dL — ABNORMAL HIGH (ref 0.61–1.24)
GFR, Estimated: 7 mL/min — ABNORMAL LOW (ref >60)
Glucose, Bld: 123 mg/dL — ABNORMAL HIGH (ref 70–99)
Potassium: 4.5 mmol/L (ref 3.5–5.1)
Sodium: 135 mmol/L (ref 135–145)

## 2020-09-25 LAB — CBC
HCT: 33.1 % — ABNORMAL LOW (ref 39.0–52.0)
Hemoglobin: 11 g/dL — ABNORMAL LOW (ref 13.0–17.0)
MCH: 31.3 pg (ref 26.0–34.0)
MCHC: 33.2 g/dL (ref 30.0–36.0)
MCV: 94 fL (ref 80.0–100.0)
Platelets: 249 10*3/uL (ref 150–400)
RBC: 3.52 MIL/uL — ABNORMAL LOW (ref 4.22–5.81)
RDW: 16.5 % — ABNORMAL HIGH (ref 11.5–15.5)
WBC: 9.5 10*3/uL (ref 4.0–10.5)
nRBC: 0 % (ref 0.0–0.2)

## 2020-09-25 LAB — GLUCOSE, CAPILLARY
Glucose-Capillary: 122 mg/dL — ABNORMAL HIGH (ref 70–99)
Glucose-Capillary: 130 mg/dL — ABNORMAL HIGH (ref 70–99)
Glucose-Capillary: 106 mg/dL — ABNORMAL HIGH (ref 70–99)

## 2020-09-25 LAB — HEPATITIS B SURFACE ANTIBODY, QUANTITATIVE: Hep B S AB Quant (Post): 3.6 m[IU]/mL — ABNORMAL LOW (ref >9.9)

## 2020-09-25 LAB — MAGNESIUM: Magnesium: 2.5 mg/dL — ABNORMAL HIGH (ref 1.7–2.4)

## 2020-09-25 MED ORDER — INSULIN GLARGINE 100 UNIT/ML ~~LOC~~ SOLN: 26.0000 [IU] | Freq: Every day | SUBCUTANEOUS | 11 refills | Status: AC

## 2020-09-25 MED ORDER — MIDODRINE HCL 10 MG PO TABS: ORAL_TABLET | ORAL | 0 refills | Status: AC

## 2020-09-25 NOTE — Progress Notes (Signed)
D/C instructions reviewed at this time. Pt began vomiting, primary RN informed.

## 2020-09-25 NOTE — Progress Notes (Signed)
PT Cancellation Note  Patient Details Name: Connor Hoover MRN: [id number] DOB: [birth year]   Cancelled Treatment:    Reason Eval/Treat Not Completed: Other (comment).  Pt reports he is leaving and declined PT.  Follow up if dc is held for some reason.   Ramond Dial 09/25/2020, 3:58 PM   Mee Hives, PT MS Acute Rehab Dept. Number: [place] [facility] [place]

## 2020-09-25 NOTE — Progress Notes (Signed)
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   OT comments  Pt assisted to sit EOB for grooming, declined OOB to chair. Pt noted to have impairment in cognition greater than upon evaluation yesterday. RN notified.  Follow Up Recommendations  Home health OT;Supervision/Assistance - 24 hour    Equipment Recommendations  3 in 1 bedside commode (bariatric)    Recommendations for Other Services      Precautions / Restrictions Precautions Precautions: Fall Required Braces or Orthoses: Other Brace Other Brace: DARCO shoe--L Restrictions Weight Bearing Restrictions: Yes LLE Weight Bearing:  (WB through heel in darco)       Mobility Bed Mobility Overal bed mobility: Needs Assistance Bed Mobility: Supine to Sit;Sit to Supine     Supine to sit: Min assist;HOB elevated Sit to supine: Mod assist   General bed mobility comments: min assist for trunk with supine to sit, mod assist for LEs back into bed  Transfers                 General transfer comment: pt declined    Balance Overall balance assessment: Needs assistance   Sitting balance-Leahy Scale: Fair                                     ADL either performed or assessed with clinical judgement   ADL Overall ADL's : Needs assistance/impaired     Grooming: Oral care;Wash/dry hands;Wash/dry face;Sitting;Minimal assistance Grooming Details (indicate cue type and reason): cues for sequencing and thoroughness         Upper Body Dressing : Minimal assistance;Sitting Upper Body Dressing Details (indicate cue type and reason): pt placing arm in gown and then immediately removing it, difficulty orienting clothing Lower Body  Dressing: Total assistance;Bed level                       Vision       Perception     Praxis      Cognition Arousal/Alertness: Awake/alert Behavior During Therapy: Flat affect Overall Cognitive Status: Impaired/Different from baseline Area of Impairment: Memory;Attention;Following commands;Safety/judgement;Awareness;Problem solving;Orientation                 Orientation Level: Time;Situation Current Attention Level: Sustained Memory: Decreased short-term memory;Decreased recall of precautions Following Commands: Follows one step commands with increased time Safety/Judgement: Decreased awareness of safety;Decreased awareness of deficits Awareness: Intellectual Problem Solving: Slow processing;Decreased initiation;Difficulty sequencing;Requires verbal cues General Comments: pt with change in cognition from visit yesterday, RN made aware        Exercises     Shoulder Instructions       General Comments      Pertinent Vitals/ Pain       Pain Assessment: No/denies pain  Home Living                                          Prior Functioning/Environment              Frequency  Min 2X/week        Progress Toward  Goals  OT Goals(current goals can now be found in the care plan section)  Progress towards OT goals: Progressing toward goals  Acute Rehab OT Goals Patient Stated Goal: To return to prior level of function and go home OT Goal Formulation: With patient Time For Goal Achievement: 10/08/20 Potential to Achieve Goals: Good  Plan Discharge plan remains appropriate    Co-evaluation                 AM-PAC OT "6 Clicks" Daily Activity     Outcome Measure   Help from another person eating meals?: None Help from another person taking care of personal grooming?: A Little Help from another person toileting, which includes using toliet, bedpan, or urinal?: Total Help from another person bathing (including  washing, rinsing, drying)?: A Lot Help from another person to put on and taking off regular upper body clothing?: A Little Help from another person to put on and taking off regular lower body clothing?: Total 6 Click Score: 14    End of Session Equipment Utilized During Treatment: Oxygen  OT Visit Diagnosis: Unsteadiness on feet (R26.81);Other abnormalities of gait and mobility (R26.89);Pain;Other symptoms and signs involving cognitive function   Activity Tolerance Patient tolerated treatment well   Patient Left in bed;with call bell/phone within reach;with bed alarm set   Nurse Communication Other (comment) (pt's change in cognition)        Time: 1340-1356 OT Time Calculation (min): 16 min  Charges: OT General Charges $OT Visit: 1 Visit OT Treatments $Self Care/Home Management : 8-22 mins  Nestor Lewandowsky, OTR/L Acute Rehabilitation Services Pager: [phone] Office: [phone]   [name] 09/25/2020, 3:46 PM

## 2020-09-25 NOTE — Discharge Summary (Signed)
Name: Connor Hoover MRN: [id number] DOB: 03-14-1946 74 y.o. PCP: [facility] [place]  Date of Admission: 09/23/2020 11:01 AM Date of Discharge: 09/25/2020 Attending Physician: Dr Gilles Chiquito   Discharge Diagnosis: 1. Acute on chronic systolic heart failure  2. Left great toe osteomyelitis s/p amputation 3. Peripheral artery disease 4. OSA  5. ESRD on HD TTS  Discharge Medications: Allergies as of 09/25/2020      Reactions   Lisinopril Swelling   Made the tongue swell and resulted in a trip to the ER   Povidine [povidone Iodine] Itching      Medication List    STOP taking these medications   metoprolol succinate 25 MG 24 hr tablet Commonly known as: TOPROL-XL     TAKE these medications   acetaminophen 325 MG tablet Commonly known as: TYLENOL Take 650 mg by mouth every 6 (six) hours as needed for mild pain or fever.   albuterol 108 (90 Base) MCG/ACT inhaler Commonly known as: VENTOLIN HFA Inhale 2 puffs into the lungs every 6 (six) hours as needed for wheezing or shortness of breath.   ammonium lactate 12 % lotion Commonly known as: LAC-HYDRIN Apply 1 application topically daily.   aspirin 81 MG chewable tablet Chew 81 mg by mouth daily.   atorvastatin 80 MG tablet Commonly known as: LIPITOR Take 40 mg by mouth at bedtime.   B complex-vitamin C-folic acid 1 MG tablet Take 1 tablet by mouth daily.   bacitracin 500 UNIT/GM ointment Apply 1 application topically See admin instructions. Apply small amount topically every day for the first week. Only apply after cleaning and before applying gauze dressing. Discontinue after first week.   cinacalcet 30 MG tablet Commonly known as: SENSIPAR Take 30 mg by mouth daily. With largest meal   clopidogrel 75 MG tablet Commonly known as: PLAVIX Take 75 mg by mouth daily.   clotrimazole 1 % cream Commonly known as: LOTRIMIN Apply 1 application topically daily.   docusate sodium 50 MG capsule Commonly  known as: COLACE Take 50 mg by mouth 2 (two) times daily.   Fluticasone-Salmeterol 100-50 MCG/DOSE Aepb Commonly known as: ADVAIR Inhale 1 puff into the lungs 2 (two) times daily.   hydrocerin Crea Apply 1 application topically 2 (two) times daily as needed.   HYDROmorphone 2 MG tablet Commonly known as: DILAUDID Take 2 mg by mouth every 6 (six) hours as needed for moderate pain or severe pain.   insulin glargine 100 UNIT/ML injection Commonly known as: Lantus Inject 0.26 mLs (26 Units total) into the skin at bedtime. What changed: how much to take   insulin NPH-regular Human (70-30) 100 UNIT/ML injection Inject 4 Units into the skin in the morning, at noon, and at bedtime.   ipratropium-albuterol 0.5-2.5 (3) MG/3ML Soln Commonly known as: DUONEB Take 3 mLs by nebulization in the morning and at bedtime.   lamoTRIgine 150 MG tablet Commonly known as: LAMICTAL Take 150 mg by mouth daily.   lidocaine 4 % cream Commonly known as: LMX Apply 1 application topically as needed. What changed: when to take this   midodrine 10 MG tablet Commonly known as: PROAMATINE Take 1 pill with food before dialysis on Tuesday, Thursday and Saturday. What changed:   medication strength  how much to take  how to take this  when to take this  additional instructions   RENAL MULTIVITAMIN/ZINC PO Take 1 tablet by mouth daily with supper.   Renvela 800 MG tablet Generic drug: sevelamer carbonate Take  1,600 mg by mouth 3 (three) times daily with meals.   triamcinolone 0.1 % Commonly known as: KENALOG Apply 1 application topically 2 (two) times daily.            Discharge Care Instructions  (From admission, onward)         Start     Ordered   09/25/20 0000  Discharge wound care:       Comments: Continue with wound vac and dressing changes with home health nurse   09/25/20 1436          Disposition and follow-up:   Connor Hoover was discharged from [facility] in Stable condition.  At the hospital follow up visit please address:  1.  Acute on chronic systolic heart failure: in setting of missed HD sessions; improved with fluid removal with HD during admission; SBP's remained soft, metoprolol held during admission and at discharge  - Discuss with PCP and cardiologist regarding initiating metoprolol; volume removal with dialysis  - Midodrine 10mg  with meals on dialysis days   Left great toe osteomyelitis s/p amputation - Continue wound care dressing and home pain control regimen  Peripheral artery disease: - Resume aspirin 81mg  daily and plavix 75mg  daily until 10/20/2020 followed by aspirin 81mg  daily alone - Continue atorvastatin 40mg  daily   OSA: - Continue using CPAP  ESRD on HD TTS: Patient had one session of dialysis during admission for volume removal with improvement in his hypervolemia as above. Patient to resume his regular scheduled dialysis session on Sat.  - Midodrine 10mg  with meals on dialysis days   2.  Labs / imaging needed at time of follow-up: BMP  3.  Pending labs/ test needing follow-up: None   Follow-up Appointments:  Follow-up [place]. Schedule an appointment as soon as possible for a visit in 1 week(s).   Specialty: General Practice Contact information: [place] [address] [phone]        Bensimhon, Shaune Pascal, MD. Schedule an appointment as soon as possible for a visit in 2 week(s).   Specialty: Cardiology Contact information: [place] [address] [phone]               Hospital Course by problem list: 1. Acute on chronic systolic heart failure:  Patient presented with generalized weakness in setting of HFrEF exacerbation from missing dialysis. During this admission, he had 1L off with HD on 11/24 with improvement in his symptoms. His known dry weight is 120.2kg, up to 125.9kg on 11/26 with bilateral lower  extremity pitting edema. He is scheduled for dialysis on 11/27 per his usual schedule. He will need midodrine 10mg  prior to HD sessions to maintain his BP.  Patient was started on metoprolol 12.5mg  daily in setting of new HFrEF during his recent hospitalization at the [place]. However, patient's SBP has been 90-100's during his admission for which his metoprolol has been held. Patient did have episode of asymptomatic ventricular tachycardia on 11/25 which is likely from his HFrEF exacerbation. He is currently hemodynamically stable without any further episodes. On discharge, would advise to continue to hold this and follow up with his PCP and cardiologist.  Patient evaluated by PT/OT and recommended for home with [facility] services.   L great toe osteomyelitis s/p amputation Patient with recent admission to [facility] for left great toe osteomyelitis for which he had amputation of the toe. Discharge summary without any further antibiotics. Wound  vac in place on admission. No surgical site infection noted. Patient initially noted to have bilateral lower extremity weakness that improved on discharge. Patient advised to continue with home wound care and pain control regimen.   Peripheral Arterial Disease Discharge summary from the [place] describes angioplasty performed in LLE during recent admission. Unclear which vessel. ON DAPT for 30 days with aspirin, plavix (until 10/20/20) followed by aspirin 81mg  daily. Continue atorvastatin therapy.   OSA Confirmed via sleep study in July. Encouraged CPAP use; however, patient refused during this hospitalization.   ESRD on HD TThS Patient presented with generalized weakness and hypervolemia in setting of missed HD session as stated above. He received HD session with 1L fluid removal with improvement in his symptoms. Patient still appeared slightly volume overload on discharge. However, he was scheduled to resume his HD sessions on 11/27. Also recommended to continue midodrine on his  dialysis days to prevent hypotension.  Discharge Vitals:   BP 112/71 (BP Location: Right Arm)   Pulse 72   Temp (!) 97.4 F (36.3 C) (Oral)   Resp 18   Wt 125.9 kg   SpO2 100%   BMI 44.12 kg/m   Pertinent Labs, Studies, and Procedures:  CBC Latest Ref Rng & Units 09/19/2020 09/25/2020 09/24/2020  WBC 4.0 - 10.5 K/uL 13.1(H) 9.5 8.7  Hemoglobin 13.0 - 17.0 g/dL 10.9(L) 11.0(L) 10.0(L)  Hematocrit 39 - 52 % 33.7(L) 33.1(L) 29.7(L)  Platelets 150 - 400 K/uL 302 249 225   BMP Latest Ref Rng & Units 09/26/2020 09/25/2020 09/24/2020  Glucose 70 - 99 mg/dL 190(H) 123(H) 124(H)  BUN 8 - 23 mg/dL 16 27(H) 18  Creatinine 0.61 - 1.24 mg/dL 6.40(H) 7.66(H) 5.89(H)  Sodium 135 - 145 mmol/L 136 135 136  Potassium 3.5 - 5.1 mmol/L 4.2 4.5 3.9  Chloride 98 - 111 mmol/L 90(L) 92(L) 95(L)  CO2 22 - 32 mmol/L 26 26 25   Calcium 8.9 - 10.3 mg/dL 10.0 9.9 9.0   CXR 09/23/2020: IMPRESSION: 1. Cardiomegaly with vascular congestion. Interval resolution of the edema. 2. Small left pleural effusion and left lung base atelectasis or infiltrate.  Discharge Instructions: Discharge Instructions    (HEART FAILURE PATIENTS) Call MD:  Anytime you have any of the following symptoms: 1) 3 pound weight gain in 24 hours or 5 pounds in 1 week 2) shortness of breath, with or without a dry hacking cough 3) swelling in the hands, feet or stomach 4) if you have to sleep on extra pillows at night in order to breathe.   Complete by: As directed    Call MD for:  difficulty breathing, headache or visual disturbances   Complete by: As directed    Call MD for:  extreme fatigue   Complete by: As directed    Call MD for:  persistant dizziness or light-headedness   Complete by: As directed    Call MD for:  persistant nausea and vomiting   Complete by: As directed    Call MD for:  redness, tenderness, or signs of infection (pain, swelling, redness, odor or green/yellow discharge around incision site)   Complete by: As  directed    Call MD for:  severe uncontrolled pain   Complete by: As directed    Call MD for:  temperature >100.4   Complete by: As directed    Diet - low sodium heart healthy   Complete by: As directed    Discharge instructions   Complete by: As directed    Connor Hoover,  You were admitted to the hospital with worsening weakness following your discharge from the [place]. During this hospitalization, you had one dialysis session with improvement in your weakness. On discharge:   1. Dialysis: Please go to your scheduled dialysis session on Saturday 09/26/2020. Please take midodrine 10mg  with food before your dialysis sessions to prevent low blood pressures during dialysis.  2. Heart failure: At your recent hospitalization, you were found to have new heart failure. You were started on metoprolol. However, this may further lower your blood pressure and cause further weakness. This was held during this admission. On discharge, please discontinue this. Please follow up with your PCP and cardiologist to further discuss restarting this medication.  3. Left foot infection: There are no signs of infection at this time. Please continue with the wound vac. A home health nurse will come to help you with the dressing changes. Please continue your aspirin and plavix at this time. Please follow up with your PCP.   4. Obstructive sleep apnea: Please continue to use your CPAP machine nightly.   Thank you for allowing [place] to participate in your care!   Discharge wound care:   Complete by: As directed    Continue with wound vac and dressing changes with home health nurse   Increase activity slowly   Complete by: As directed       Signed: [name], MD  IMTS PGY-2 09/26/2020, 11:58 AM   Pager: [phone]

## 2020-09-25 NOTE — Progress Notes (Signed)
Subjective: [name]  Yesterday, patient had a three minute episode of wide-complex tachycardia during which reported to be unresponsive to questioning and wheezing for which he was placed on supplemental oxygen. He had one episode of emesis following this without recurrence of symptoms. EKG demonstrated possible ectopic atrial rhythm with PACs and aberrant conduction, persistent RBBB with possible inferior and anterolateral infarcts; however, may have possible lead reversal. During IMTS evaluation, patient back to baseline and hemodynamically stable. No further events or actions taken   This morning, [name] was evaluated at bedside. He endorses feeling well this morning and his weakness has resolved. When questioned regarding episode from yesterday, patient endorses that he was sleeping and reports feeling well after this. He denies any further symptoms. He notes sleeping well overnight. Did have some nausea this morning following breakfast but believes that this was because he did not like the grits. Patient endorses that his weakness has improved since presentation and he is not in any acute pain at this time. His breathing has also improved.    Objective:  Vital signs in last 24 hours: Vitals:   09/24/20 2251 09/25/20 0021 09/25/20 0500 09/25/20 0528  BP: 98/67 102/65 94/77 108/69  Pulse:  71 70   Resp:  20 20   Temp:  98.6 F (37 C) 97.7 F (36.5 C)   TempSrc:  Oral Oral   SpO2:  100% 100%   Weight:  125.9 kg     CBC Latest Ref Rng & Units 09/25/2020 09/24/2020 09/23/2020  WBC 4.0 - 10.5 K/uL 9.5 8.7 11.6(H)  Hemoglobin 13.0 - 17.0 g/dL 11.0(L) 10.0(L) 10.4(L)  Hematocrit 39 - 52 % 33.1(L) 29.7(L) 31.6(L)  Platelets 150 - 400 K/uL 249 225 249   BMP Latest Ref Rng & Units 09/25/2020 09/24/2020 09/23/2020  Glucose 70 - 99 mg/dL 123(H) 124(H) 133(H)  BUN 8 - 23 mg/dL 27(H) 18 35(H)  Creatinine 0.61 - 1.24 mg/dL 7.66(H) 5.89(H) 9.51(H)  Sodium 135 - 145 mmol/L 135 136  132(L)  Potassium 3.5 - 5.1 mmol/L 4.5 3.9 5.0  Chloride 98 - 111 mmol/L 92(L) 95(L) 92(L)  CO2 22 - 32 mmol/L 26 25 24   Calcium 8.9 - 10.3 mg/dL 9.9 9.0 9.4   Physical Exam  Constitutional: Appears well-developed, obese elderly male. No distress.  HENT: [place]/AT, EOMI, MMM Cardiovascular: RRR,  S1 and S2 present, no murmurs, rubs, gallops.  Distal pulses intact Respiratory: No respiratory distress, no accessory muscle use. Lungs are clear to auscultation bilaterally. GI: Nondistended, soft, nontender to palpation, bowel sounds present  Musculoskeletal: Normal bulk and tone.  1+ pitting edema to bilateral lower extremities; left foot in dressing that is clean and dry Neurological: Is alert and oriented x4, no apparent focal deficits noted. Skin: Warm and dry.  Bilateral lower extremities with chronic venous stasis changes   Assessment/Plan: [name] is a 74 yo M w/ PMH of ESRD Tu,Thu,Sa, DM, OSA, HFrEF (EF 30% 2020), PAD s/p angioplasty, recent osteomyelitis of L great toe s/p amputation presenting with weakness with acute on chronic systolic heart failure in setting of ESRD after missing his regularly scheduled dialysis session. 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 During this admission, he had 1L off with HD on 11/24 with improvement in his symptoms. His known dry weight is 120.2kg, up to 125.9kg today  with bilateral lower extremity pitting edema. He is scheduled for dialysis tomorrow per his usual schedule. He will need midodrine 10mg  prior to HD sessions to maintain his BP.  Patient was started on metoprolol 12.5mg  daily in setting of new HFrEF during his recent hospitalization at the [place]. However, patient's SBP has been 90-100's during his admission for which his metoprolol  has been held. Patient did have episode of asymptomatic ventricular tachycardia yesterday which is likely from his HFrEF exacerbation. He is currently hemodynamically stable without any further episodes. On discharge, would advise to continue to hold this and follow up with his PCP and cardiologist.  Patient evaluated by PT/OT and recommended for home with [facility] services.  - Appreciate nephrology recommendations  - PT/OT eval - Discontinue metoprolol 12.5mg  daily on discharge - Midodrine 10mg  with meals on dialysis days  - HD per usual schedule tomorrow   L great toe osteomyelitis s/p amputation Patient states he felt better, regained all his limb functioning. . Feels better this morning. Currently afebrile. Will not continue antibiotics - Recommend continuing with wound care dressing  - Resuming home pain control medication   Peripheral Arterial Disease Discharge summary describes angioplasty performed in LLE during recent admission. Unclear which vessel. ON DAPT for 30 days with aspirin, plavix (until 10/20/20). - C/w home meds: aspirin 81mg  daily, clopidogrel 75mg  daily, atorvastatin 40mg  daily  OSA Confirmed via sleep study in July.  - C/w CPAP  T2DM On lantus 26 units qhs, 6 units NPH. Current glucose 124. - Lantus 20 units qhs - Novolog 4 units TID qc - SSI, glucose checks  COPD - C/w home meds: albuterol, duoneb prn, fluticasone/solumedrol  Prior to Admission Living Arrangement: Home Anticipated Discharge Location: Home Barriers to Discharge: Ongoing medical management Dispo: Anticipated discharge in approximately 1-2 day(s).   [name], MD  IMTS PGY-2 09/25/2020, 6:34 AM Pager: [phone] After 5pm on weekdays and 1pm on weekends: On Call pager [phone]

## 2020-09-25 NOTE — Progress Notes (Signed)
[facility] Progress Note   Assessment/ Plan:   Outpatient Dialysis Orders:  [facility]: [place]  on TTS. 4:15hr, BFR 450, DFR 800, EDW 116kg, 2K/2Ca. AVF 15g, heparin 8000 unit bolus  Assessment/Plan: 1.  Weakness: Progressive weakness after d/c from [place] with L great toe amputation. Cellulitis suspected, started on empiric antibiotics.  Improved today after dialysis 2.  ESRD:  Dialyzes TTS, last HD  PTA was Saturday.  Missed one HD but BUN/Cr not significantly elevated compared to baseline. Appears slightly volume overloaded on exam. HD Wednesday, next planned for Saturday.  3.  Hypertension/volume: BP controlled. Mildly volume overloaded as above. Will need standing weights to better assess EDW once patient is able.  4.  Anemia: Hgb 10.4. No ESA indicated at this time.  5.  Metabolic bone disease: Corrected calcium 10.1. Not on VDRA, using low Ca bath with HD. Continue renvela and sensipar.  6.  Nutrition:  Alb low, will add on protein supplement. Needs renal diet/fluid restrictions 7. Wide complex tachycardia: ended after 3 min.  SR on monitor once was placed.  Will add on Mg.    Subjective:    Had wide complex tachycardia yesterday for about 3 min, became unresponsive, no loss of pulse.  On zoll had SR.  NO other events.  No complaints this AM.     Objective:   BP (!) 95/57 (BP Location: Right Wrist)   Pulse 74   Temp 98.4 F (36.9 C) (Oral)   Resp 16   Wt 125.9 kg   SpO2 100%   BMI 44.12 kg/m   Physical Exam: General: NAD Neck: JVD not elevated. Lungs: Clear bilaterally to auscultation anteriorly without wheezes, rales, or rhonchi. Breathing is unlabored. Heart: RRR with normal S1, S2. No murmurs, rubs, or gallops appreciated. Abdomen: Soft, non-tender, non-distended with normoactive bowel sounds. No rebound/guarding. No obvious abdominal masses. Lower extremities: L great toe amputation with wound vac, b/l lower extremities with dry/slightly erythematous  skin R >L, improved with sarna,  and 1+ woody edema Neuro: Alert and oriented X 3. Dialysis Access: LUE AVF + bruit  Labs: BMET Recent Labs  Lab 09/23/20 1119 09/24/20 0248 09/25/20 0353  NA 132* 136 135  K 5.0 3.9 4.5  CL 92* 95* 92*  CO2 24 25 26   GLUCOSE 133* 124* 123*  BUN 35* 18 27*  CREATININE 9.51* 5.89* 7.66*  CALCIUM 9.4 9.0 9.9  PHOS  --  4.1  --    CBC Recent Labs  Lab 09/23/20 1119 09/24/20 0248 09/25/20 0353  WBC 11.6* 8.7 9.5  NEUTROABS  --  6.3  --   HGB 10.4* 10.0* 11.0*  HCT 31.6* 29.7* 33.1*  MCV 94.9 92.5 94.0  PLT 249 225 249      Medications:    . aspirin  81 mg Oral Daily  . atorvastatin  40 mg Oral QHS  . bacitracin  1 application Topical QHS  . Chlorhexidine Gluconate Cloth  6 each Topical Q0600  . cinacalcet  30 mg Oral Q supper  . clopidogrel  75 mg Oral Daily  . docusate sodium  50 mg Oral BID  . fluticasone furoate-vilanterol  1 puff Inhalation Daily  . heparin  5,000 Units Subcutaneous Q8H  . insulin aspart  0-9 Units Subcutaneous TID WC  . insulin aspart  4 Units Subcutaneous TID WC  . insulin glargine  20 Units Subcutaneous QHS  . lamoTRIgine  150 mg Oral Daily  . metoprolol succinate  12.5 mg Oral Daily  .  midodrine  10 mg Oral TID WC  . multivitamin  1 tablet Oral QHS  . sevelamer carbonate  1,600 mg Oral TID WC     Madelon Lips, MD 09/25/2020, 10:31 AM

## 2020-09-25 NOTE — TOC Initial Note (Addendum)
Transition of Care [facility]) - Initial/Assessment Note    Patient Details  Name: Connor Hoover MRN: [id number] Date of Birth: 08-03-1946  Transition of Care [facility]) CM/SW Contact:    Sherrilyn Rist Transition of Care Phone Number: [phone] 09/25/2020, 3:16 PM  Clinical Narrative:                 Patient goes to the [facility] for medical care. Orders for [facility] services faxed to the [facility] for approval and they will arrange [facility] services for the patient. Per patient, he has a walker, shower chair/ 3:1 at home.  5:09pm - patient is on home oxygen, spouse forgot to bring his 02 tank. Home oxygen was approved through the [place], spouse plans to return home and bring his portable oxygen tank to the hospital for discharge; Asked spouse with bedside nurse present if she wanted him to have an ambulance transportation home, she stated "no." She plans to have a friend available when she returns home to help him get in the home. Physical Therapy attempted to work with the patient but he refused.    Expected Discharge Plan: [place] Barriers to Discharge: No Barriers Identified   Patient Goals and CMS Choice Patient states their goals for this hospitalization and ongoing recovery are:: to go home      Expected Discharge Plan and Services Expected Discharge Plan: [place]   Discharge Planning Services: CM Consult   Living arrangements for the past 2 months: Single Family Home Expected Discharge Date: 09/25/20                                    Prior Living Arrangements/Services Living arrangements for the past 2 months: Single Family Home Lives with:: Spouse Patient language and need for interpreter reviewed:: No Do you feel safe going back to the place where you live?: Yes      Need for Family Participation in Patient Care: No (Comment) Care giver support system in place?: Yes (comment)   Criminal Activity/Legal Involvement Pertinent to  Current Situation/Hospitalization: No - Comment as needed  Activities of Daily Living      Permission Sought/Granted Permission sought to share information with : Case Manager Permission granted to share information with : Yes, Verbal Permission Granted     Permission granted to share info w AGENCY: [facility]        Emotional Assessment Appearance:: Developmentally appropriate Attitude/Demeanor/Rapport: Gracious Affect (typically observed): Accepting Orientation: : Oriented to Self, Oriented to Place Alcohol / Substance Use: Not Applicable Psych Involvement: No (comment)  Admission diagnosis:  Dyspnea [R06.00] ESRD on dialysis ([place]) [N18.6, Z99.2] Generalized weakness [R53.1] Cellulitis of left lower leg [L03.116] Acute on chronic systolic (congestive) heart failure (HCC) [I50.23] Patient Active Problem List   Diagnosis Date Noted  . Acute on chronic systolic (congestive) heart failure ([place]) 09/23/2020  . DIABETES MELLITUS, TYPE II, ON INSULIN 11/30/2009  . HYPERLIPIDEMIA 11/30/2009  . OBESITY 11/30/2009  . HYPERTENSION 11/30/2009  . 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 CAD, NATIVE VESSEL 11/30/2009  . GERD 11/30/2009  . RENAL INSUFFICIENCY 11/30/2009  . TOBACCO USE, QUIT 11/30/2009   PCP:  [facility], [place], [place] [address] Phone: [phone] Fax: [phone]  [id number] [place], [place]. AT  [address] [place] [address] Phone: [phone] Fax: [phone]     Social Determinants of Health (SDOH) Interventions    Readmission Risk Interventions No flowsheet data found.

## 2020-09-25 NOTE — Plan of Care (Signed)
  Problem: Elimination: Goal: Will not experience complications related to urinary retention Outcome: Completed/Met

## 2020-09-26 ENCOUNTER — Telehealth (HOSPITAL_COMMUNITY): Payer: Self-pay | Admitting: Nephrology

## 2020-09-26 NOTE — Telephone Encounter (Signed)
Transition of care contact from inpatient facility  Date of discharge: 09/25/20 Date of contact: 09/26/20 Method: Phone Spoke to: Patient's wife  Patient contacted to discuss transition of care from recent inpatient hospitalization. Patient was admitted to [facility] from 11/24 - 09/25/2020 with discharge diagnosis of weakness, hypotension, foot cellulitis  Medication changes were reviewed. Metoprolol d/c'd, reminded to take midodrine TIW pre-HD.  He was able to make it to his HD session today - there now - they were able to transport him without issues.  She reports home health has been in touch and wound care RN coming to their home this afternoon.  No additional needs identified at this time, she will let the dialysis SW know if any arise.  Veneta Penton, PA-C [employer] Pager [phone]

## 2020-09-27 ENCOUNTER — Other Ambulatory Visit: Payer: Self-pay

## 2020-09-27 ENCOUNTER — Inpatient Hospital Stay (HOSPITAL_COMMUNITY): Payer: No Typology Code available for payment source

## 2020-09-27 ENCOUNTER — Emergency Department (HOSPITAL_COMMUNITY): Payer: No Typology Code available for payment source

## 2020-09-27 ENCOUNTER — Inpatient Hospital Stay (HOSPITAL_COMMUNITY)
Admission: EM | Admit: 2020-09-27 | Discharge: 2020-10-31 | DRG: 917 | Disposition: E | Payer: No Typology Code available for payment source | Attending: Pulmonary Disease | Admitting: Pulmonary Disease

## 2020-09-27 ENCOUNTER — Encounter (HOSPITAL_COMMUNITY): Payer: Self-pay

## 2020-09-27 DIAGNOSIS — T402X1A Poisoning by other opioids, accidental (unintentional), initial encounter: Principal | ICD-10-CM | POA: Diagnosis present

## 2020-09-27 DIAGNOSIS — I440 Atrioventricular block, first degree: Secondary | ICD-10-CM | POA: Diagnosis present

## 2020-09-27 DIAGNOSIS — I255 Ischemic cardiomyopathy: Secondary | ICD-10-CM | POA: Diagnosis not present

## 2020-09-27 DIAGNOSIS — G9341 Metabolic encephalopathy: Secondary | ICD-10-CM | POA: Diagnosis present

## 2020-09-27 DIAGNOSIS — I959 Hypotension, unspecified: Secondary | ICD-10-CM | POA: Diagnosis not present

## 2020-09-27 DIAGNOSIS — Z89412 Acquired absence of left great toe: Secondary | ICD-10-CM

## 2020-09-27 DIAGNOSIS — Z20822 Contact with and (suspected) exposure to covid-19: Secondary | ICD-10-CM | POA: Diagnosis present

## 2020-09-27 DIAGNOSIS — Z992 Dependence on renal dialysis: Secondary | ICD-10-CM

## 2020-09-27 DIAGNOSIS — R739 Hyperglycemia, unspecified: Secondary | ICD-10-CM | POA: Diagnosis not present

## 2020-09-27 DIAGNOSIS — I2510 Atherosclerotic heart disease of native coronary artery without angina pectoris: Secondary | ICD-10-CM | POA: Diagnosis present

## 2020-09-27 DIAGNOSIS — I472 Ventricular tachycardia, unspecified: Secondary | ICD-10-CM

## 2020-09-27 DIAGNOSIS — I9589 Other hypotension: Secondary | ICD-10-CM | POA: Diagnosis present

## 2020-09-27 DIAGNOSIS — E872 Acidosis: Secondary | ICD-10-CM | POA: Diagnosis present

## 2020-09-27 DIAGNOSIS — R6521 Severe sepsis with septic shock: Secondary | ICD-10-CM | POA: Diagnosis not present

## 2020-09-27 DIAGNOSIS — Z888 Allergy status to other drugs, medicaments and biological substances status: Secondary | ICD-10-CM

## 2020-09-27 DIAGNOSIS — R570 Cardiogenic shock: Secondary | ICD-10-CM | POA: Diagnosis present

## 2020-09-27 DIAGNOSIS — Z66 Do not resuscitate: Secondary | ICD-10-CM | POA: Diagnosis not present

## 2020-09-27 DIAGNOSIS — N186 End stage renal disease: Secondary | ICD-10-CM | POA: Diagnosis present

## 2020-09-27 DIAGNOSIS — Z79899 Other long term (current) drug therapy: Secondary | ICD-10-CM

## 2020-09-27 DIAGNOSIS — I453 Trifascicular block: Secondary | ICD-10-CM | POA: Diagnosis present

## 2020-09-27 DIAGNOSIS — Z7951 Long term (current) use of inhaled steroids: Secondary | ICD-10-CM

## 2020-09-27 DIAGNOSIS — Z9861 Coronary angioplasty status: Secondary | ICD-10-CM

## 2020-09-27 DIAGNOSIS — J9601 Acute respiratory failure with hypoxia: Secondary | ICD-10-CM | POA: Diagnosis present

## 2020-09-27 DIAGNOSIS — E1151 Type 2 diabetes mellitus with diabetic peripheral angiopathy without gangrene: Secondary | ICD-10-CM | POA: Diagnosis present

## 2020-09-27 DIAGNOSIS — Z8249 Family history of ischemic heart disease and other diseases of the circulatory system: Secondary | ICD-10-CM

## 2020-09-27 DIAGNOSIS — E1165 Type 2 diabetes mellitus with hyperglycemia: Secondary | ICD-10-CM | POA: Diagnosis present

## 2020-09-27 DIAGNOSIS — I509 Heart failure, unspecified: Secondary | ICD-10-CM

## 2020-09-27 DIAGNOSIS — D649 Anemia, unspecified: Secondary | ICD-10-CM | POA: Diagnosis not present

## 2020-09-27 DIAGNOSIS — R0602 Shortness of breath: Secondary | ICD-10-CM | POA: Diagnosis not present

## 2020-09-27 DIAGNOSIS — R4182 Altered mental status, unspecified: Secondary | ICD-10-CM | POA: Diagnosis present

## 2020-09-27 DIAGNOSIS — A419 Sepsis, unspecified organism: Secondary | ICD-10-CM | POA: Diagnosis present

## 2020-09-27 DIAGNOSIS — I132 Hypertensive heart and chronic kidney disease with heart failure and with stage 5 chronic kidney disease, or end stage renal disease: Secondary | ICD-10-CM | POA: Diagnosis present

## 2020-09-27 DIAGNOSIS — Z7902 Long term (current) use of antithrombotics/antiplatelets: Secondary | ICD-10-CM

## 2020-09-27 DIAGNOSIS — I5043 Acute on chronic combined systolic (congestive) and diastolic (congestive) heart failure: Secondary | ICD-10-CM | POA: Diagnosis present

## 2020-09-27 DIAGNOSIS — Z794 Long term (current) use of insulin: Secondary | ICD-10-CM

## 2020-09-27 DIAGNOSIS — Z9862 Peripheral vascular angioplasty status: Secondary | ICD-10-CM

## 2020-09-27 DIAGNOSIS — Z6841 Body Mass Index (BMI) 40.0 and over, adult: Secondary | ICD-10-CM | POA: Diagnosis not present

## 2020-09-27 DIAGNOSIS — I361 Nonrheumatic tricuspid (valve) insufficiency: Secondary | ICD-10-CM | POA: Diagnosis not present

## 2020-09-27 DIAGNOSIS — I5023 Acute on chronic systolic (congestive) heart failure: Secondary | ICD-10-CM | POA: Diagnosis not present

## 2020-09-27 DIAGNOSIS — E1122 Type 2 diabetes mellitus with diabetic chronic kidney disease: Secondary | ICD-10-CM | POA: Diagnosis present

## 2020-09-27 DIAGNOSIS — E875 Hyperkalemia: Secondary | ICD-10-CM | POA: Diagnosis present

## 2020-09-27 DIAGNOSIS — J441 Chronic obstructive pulmonary disease with (acute) exacerbation: Secondary | ICD-10-CM | POA: Diagnosis present

## 2020-09-27 DIAGNOSIS — I429 Cardiomyopathy, unspecified: Secondary | ICD-10-CM | POA: Insufficient documentation

## 2020-09-27 DIAGNOSIS — I5082 Biventricular heart failure: Secondary | ICD-10-CM | POA: Diagnosis present

## 2020-09-27 DIAGNOSIS — R1110 Vomiting, unspecified: Secondary | ICD-10-CM

## 2020-09-27 DIAGNOSIS — Z883 Allergy status to other anti-infective agents status: Secondary | ICD-10-CM

## 2020-09-27 DIAGNOSIS — I25119 Atherosclerotic heart disease of native coronary artery with unspecified angina pectoris: Secondary | ICD-10-CM | POA: Diagnosis not present

## 2020-09-27 DIAGNOSIS — E1169 Type 2 diabetes mellitus with other specified complication: Secondary | ICD-10-CM | POA: Diagnosis present

## 2020-09-27 DIAGNOSIS — E785 Hyperlipidemia, unspecified: Secondary | ICD-10-CM | POA: Diagnosis present

## 2020-09-27 DIAGNOSIS — Z87891 Personal history of nicotine dependence: Secondary | ICD-10-CM

## 2020-09-27 DIAGNOSIS — R0609 Other forms of dyspnea: Secondary | ICD-10-CM | POA: Diagnosis not present

## 2020-09-27 DIAGNOSIS — I252 Old myocardial infarction: Secondary | ICD-10-CM

## 2020-09-27 DIAGNOSIS — Z9181 History of falling: Secondary | ICD-10-CM

## 2020-09-27 DIAGNOSIS — I5084 End stage heart failure: Secondary | ICD-10-CM | POA: Diagnosis present

## 2020-09-27 DIAGNOSIS — M869 Osteomyelitis, unspecified: Secondary | ICD-10-CM | POA: Diagnosis present

## 2020-09-27 DIAGNOSIS — Z951 Presence of aortocoronary bypass graft: Secondary | ICD-10-CM

## 2020-09-27 DIAGNOSIS — R0600 Dyspnea, unspecified: Secondary | ICD-10-CM

## 2020-09-27 DIAGNOSIS — N2581 Secondary hyperparathyroidism of renal origin: Secondary | ICD-10-CM | POA: Diagnosis present

## 2020-09-27 DIAGNOSIS — Z515 Encounter for palliative care: Secondary | ICD-10-CM

## 2020-09-27 DIAGNOSIS — Z23 Encounter for immunization: Secondary | ICD-10-CM

## 2020-09-27 DIAGNOSIS — T50901A Poisoning by unspecified drugs, medicaments and biological substances, accidental (unintentional), initial encounter: Secondary | ICD-10-CM

## 2020-09-27 DIAGNOSIS — I444 Left anterior fascicular block: Secondary | ICD-10-CM | POA: Diagnosis present

## 2020-09-27 DIAGNOSIS — G4733 Obstructive sleep apnea (adult) (pediatric): Secondary | ICD-10-CM | POA: Diagnosis present

## 2020-09-27 DIAGNOSIS — E11649 Type 2 diabetes mellitus with hypoglycemia without coma: Secondary | ICD-10-CM | POA: Diagnosis not present

## 2020-09-27 DIAGNOSIS — D631 Anemia in chronic kidney disease: Secondary | ICD-10-CM | POA: Diagnosis present

## 2020-09-27 DIAGNOSIS — I452 Bifascicular block: Secondary | ICD-10-CM | POA: Diagnosis present

## 2020-09-27 DIAGNOSIS — Z7189 Other specified counseling: Secondary | ICD-10-CM | POA: Diagnosis not present

## 2020-09-27 DIAGNOSIS — F4310 Post-traumatic stress disorder, unspecified: Secondary | ICD-10-CM | POA: Diagnosis present

## 2020-09-27 DIAGNOSIS — Z7982 Long term (current) use of aspirin: Secondary | ICD-10-CM

## 2020-09-27 DIAGNOSIS — I462 Cardiac arrest due to underlying cardiac condition: Secondary | ICD-10-CM | POA: Diagnosis present

## 2020-09-27 HISTORY — DX: End stage renal disease: N18.6

## 2020-09-27 HISTORY — DX: Osteomyelitis, unspecified: M86.9

## 2020-09-27 HISTORY — DX: Cardiomyopathy, unspecified: I42.9

## 2020-09-27 LAB — RESP PANEL BY RT-PCR (FLU A&B, COVID) ARPGX2
Influenza A by PCR: NEGATIVE
Influenza B by PCR: NEGATIVE
SARS Coronavirus 2 by RT PCR: NEGATIVE

## 2020-09-27 LAB — COMPREHENSIVE METABOLIC PANEL
ALT: 45 U/L — ABNORMAL HIGH (ref 0–44)
AST: 74 U/L — ABNORMAL HIGH (ref 15–41)
Albumin: 3.3 g/dL — ABNORMAL LOW (ref 3.5–5.0)
Alkaline Phosphatase: 158 U/L — ABNORMAL HIGH (ref 38–126)
Anion gap: 21 — ABNORMAL HIGH (ref 5–15)
BUN: 19 mg/dL (ref 8–23)
CO2: 24 mmol/L (ref 22–32)
Calcium: 10.1 mg/dL (ref 8.9–10.3)
Chloride: 91 mmol/L — ABNORMAL LOW (ref 98–111)
Creatinine, Ser: 7.07 mg/dL — ABNORMAL HIGH (ref 0.61–1.24)
GFR, Estimated: 8 mL/min — ABNORMAL LOW (ref >60)
Glucose, Bld: 290 mg/dL — ABNORMAL HIGH (ref 70–99)
Potassium: 4.4 mmol/L (ref 3.5–5.1)
Sodium: 136 mmol/L (ref 135–145)
Total Bilirubin: 1.5 mg/dL — ABNORMAL HIGH (ref 0.3–1.2)
Total Protein: 7.6 g/dL (ref 6.5–8.1)

## 2020-09-27 LAB — CBC WITH DIFFERENTIAL/PLATELET
Abs Immature Granulocytes: 0.3 10*3/uL — ABNORMAL HIGH (ref 0.00–0.07)
Basophils Absolute: 0.2 10*3/uL — ABNORMAL HIGH (ref 0.0–0.1)
Basophils Relative: 1 %
Eosinophils Absolute: 0.2 10*3/uL (ref 0.0–0.5)
Eosinophils Relative: 2 %
HCT: 33.7 % — ABNORMAL LOW (ref 39.0–52.0)
Hemoglobin: 10.9 g/dL — ABNORMAL LOW (ref 13.0–17.0)
Immature Granulocytes: 2 %
Lymphocytes Relative: 8 %
Lymphs Abs: 1.1 10*3/uL (ref 0.7–4.0)
MCH: 31.3 pg (ref 26.0–34.0)
MCHC: 32.3 g/dL (ref 30.0–36.0)
MCV: 96.8 fL (ref 80.0–100.0)
Monocytes Absolute: 1.4 10*3/uL — ABNORMAL HIGH (ref 0.1–1.0)
Monocytes Relative: 10 %
Neutro Abs: 10 10*3/uL — ABNORMAL HIGH (ref 1.7–7.7)
Neutrophils Relative %: 77 %
Platelets: 302 10*3/uL (ref 150–400)
RBC: 3.48 MIL/uL — ABNORMAL LOW (ref 4.22–5.81)
RDW: 16.8 % — ABNORMAL HIGH (ref 11.5–15.5)
WBC: 13.1 10*3/uL — ABNORMAL HIGH (ref 4.0–10.5)
nRBC: 2 % — ABNORMAL HIGH (ref 0.0–0.2)

## 2020-09-27 LAB — BASIC METABOLIC PANEL
Anion gap: 20 — ABNORMAL HIGH (ref 5–15)
BUN: 16 mg/dL (ref 8–23)
CO2: 26 mmol/L (ref 22–32)
Calcium: 10 mg/dL (ref 8.9–10.3)
Chloride: 90 mmol/L — ABNORMAL LOW (ref 98–111)
Creatinine, Ser: 6.4 mg/dL — ABNORMAL HIGH (ref 0.61–1.24)
GFR, Estimated: 9 mL/min — ABNORMAL LOW (ref >60)
Glucose, Bld: 190 mg/dL — ABNORMAL HIGH (ref 70–99)
Potassium: 4.2 mmol/L (ref 3.5–5.1)
Sodium: 136 mmol/L (ref 135–145)

## 2020-09-27 LAB — I-STAT VENOUS BLOOD GAS, ED
Acid-Base Excess: 2 mmol/L (ref 0.0–2.0)
Bicarbonate: 28.1 mmol/L — ABNORMAL HIGH (ref 20.0–28.0)
Calcium, Ion: 0.96 mmol/L — ABNORMAL LOW (ref 1.15–1.40)
HCT: 36 % — ABNORMAL LOW (ref 39.0–52.0)
Hemoglobin: 12.2 g/dL — ABNORMAL LOW (ref 13.0–17.0)
O2 Saturation: 76 %
Potassium: 5.4 mmol/L — ABNORMAL HIGH (ref 3.5–5.1)
Sodium: 133 mmol/L — ABNORMAL LOW (ref 135–145)
TCO2: 30 mmol/L (ref 22–32)
pCO2, Ven: 49.5 mmHg (ref 44.0–60.0)
pH, Ven: 7.362 (ref 7.250–7.430)
pO2, Ven: 43 mmHg (ref 32.0–45.0)

## 2020-09-27 LAB — ECHOCARDIOGRAM COMPLETE
Area-P 1/2: 4.26 cm2
Calc EF: 28.4 %
Height: 66.5 in
S' Lateral: 5.5 cm
Single Plane A2C EF: 38.4 %
Single Plane A4C EF: 16.4 %
Weight: 4440.95 oz

## 2020-09-27 LAB — TROPONIN I (HIGH SENSITIVITY)
Troponin I (High Sensitivity): 98 ng/L — ABNORMAL HIGH (ref <18)
Troponin I (High Sensitivity): 91 ng/L — ABNORMAL HIGH (ref <18)

## 2020-09-27 LAB — GLUCOSE, CAPILLARY
Glucose-Capillary: 277 mg/dL — ABNORMAL HIGH (ref 70–99)
Glucose-Capillary: 282 mg/dL — ABNORMAL HIGH (ref 70–99)
Glucose-Capillary: 259 mg/dL — ABNORMAL HIGH (ref 70–99)

## 2020-09-27 LAB — MAGNESIUM
Magnesium: 2.5 mg/dL — ABNORMAL HIGH (ref 1.7–2.4)
Magnesium: 2.5 mg/dL — ABNORMAL HIGH (ref 1.7–2.4)

## 2020-09-27 LAB — PHOSPHORUS: Phosphorus: 6.1 mg/dL — ABNORMAL HIGH (ref 2.5–4.6)

## 2020-09-27 LAB — BRAIN NATRIURETIC PEPTIDE: B Natriuretic Peptide: 1039.7 pg/mL — ABNORMAL HIGH (ref 0.0–100.0)

## 2020-09-27 MED ORDER — PNEUMOCOCCAL VAC POLYVALENT 25 MCG/0.5ML IJ INJ
0.5000 mL | INJECTION | INTRAMUSCULAR | Status: AC
  Administered 2020-09-28: 0.5 mL via INTRAMUSCULAR
  Filled 2020-09-27: qty 0.5

## 2020-09-27 MED ORDER — AMIODARONE LOAD VIA INFUSION
150.0000 mg | Freq: Once | INTRAVENOUS | Status: DC
  Filled 2020-09-27: qty 83.34

## 2020-09-27 MED ORDER — MIDODRINE HCL 5 MG PO TABS: 10.0000 mg | ORAL_TABLET | ORAL | Status: DC

## 2020-09-27 MED ORDER — ENOXAPARIN SODIUM 30 MG/0.3ML ~~LOC~~ SOLN
30.0000 mg | SUBCUTANEOUS | Status: DC
  Administered 2020-09-27 – 2020-09-28 (×2): 30 mg via SUBCUTANEOUS
  Filled 2020-09-27 (×2): qty 0.3

## 2020-09-27 MED ORDER — INSULIN ASPART 100 UNIT/ML ~~LOC~~ SOLN
0.0000 [IU] | Freq: Three times a day (TID) | SUBCUTANEOUS | Status: DC
  Administered 2020-09-27 (×2): 3 [IU] via SUBCUTANEOUS
  Administered 2020-09-28: 2 [IU] via SUBCUTANEOUS

## 2020-09-27 MED ORDER — FUROSEMIDE 10 MG/ML IJ SOLN
INTRAMUSCULAR | Status: AC
  Administered 2020-09-27: 80 mg via INTRAVENOUS
  Filled 2020-09-27: qty 6

## 2020-09-27 MED ORDER — IPRATROPIUM BROMIDE 0.02 % IN SOLN
0.5000 mg | Freq: Once | RESPIRATORY_TRACT | Status: AC
  Administered 2020-09-27: 0.5 mg via RESPIRATORY_TRACT
  Filled 2020-09-27: qty 2.5

## 2020-09-27 MED ORDER — LAMOTRIGINE 150 MG PO TABS
150.0000 mg | ORAL_TABLET | Freq: Every day | ORAL | Status: DC
  Filled 2020-09-27: qty 1

## 2020-09-27 MED ORDER — AMIODARONE HCL IN DEXTROSE 360-4.14 MG/200ML-% IV SOLN
30.0000 mg/h | INTRAVENOUS | Status: DC
  Administered 2020-09-27 – 2020-10-01 (×10): 30 mg/h via INTRAVENOUS
  Filled 2020-09-27 (×9): qty 200

## 2020-09-27 MED ORDER — CHLORHEXIDINE GLUCONATE CLOTH 2 % EX PADS
6.0000 | MEDICATED_PAD | Freq: Every day | CUTANEOUS | Status: DC
  Administered 2020-09-27 – 2020-09-30 (×3): 6 via TOPICAL

## 2020-09-27 MED ORDER — IPRATROPIUM-ALBUTEROL 0.5-2.5 (3) MG/3ML IN SOLN: 3.0000 mL | Freq: Four times a day (QID) | RESPIRATORY_TRACT | Status: DC | PRN

## 2020-09-27 MED ORDER — AMMONIUM LACTATE 12 % EX LOTN
1.0000 "application " | TOPICAL_LOTION | Freq: Every day | CUTANEOUS | Status: DC
  Administered 2020-09-27 – 2020-09-30 (×4): 1 via TOPICAL
  Filled 2020-09-27: qty 225

## 2020-09-27 MED ORDER — INSULIN GLARGINE 100 UNIT/ML ~~LOC~~ SOLN
25.0000 [IU] | Freq: Every day | SUBCUTANEOUS | Status: DC
  Administered 2020-09-27 – 2020-09-29 (×3): 25 [IU] via SUBCUTANEOUS
  Filled 2020-09-27 (×6): qty 0.25

## 2020-09-27 MED ORDER — CINACALCET HCL 30 MG PO TABS
30.0000 mg | ORAL_TABLET | Freq: Every day | ORAL | Status: DC
  Administered 2020-09-28 – 2020-09-30 (×2): 30 mg via ORAL
  Filled 2020-09-27 (×5): qty 1

## 2020-09-27 MED ORDER — ACETAMINOPHEN 650 MG RE SUPP: 650.0000 mg | Freq: Four times a day (QID) | RECTAL | Status: DC | PRN

## 2020-09-27 MED ORDER — ALBUTEROL (5 MG/ML) CONTINUOUS INHALATION SOLN
10.0000 mg/h | INHALATION_SOLUTION | Freq: Once | RESPIRATORY_TRACT | Status: AC
  Administered 2020-09-27: 10 mg/h via RESPIRATORY_TRACT
  Filled 2020-09-27: qty 20

## 2020-09-27 MED ORDER — ASPIRIN 81 MG PO CHEW: 81.0000 mg | CHEWABLE_TABLET | Freq: Every day | ORAL | Status: DC

## 2020-09-27 MED ORDER — SODIUM CHLORIDE 0.9 % IV BOLUS
500.0000 mL | Freq: Once | INTRAVENOUS | Status: AC
  Administered 2020-09-27: 500 mL via INTRAVENOUS

## 2020-09-27 MED ORDER — ASPIRIN 81 MG PO CHEW
81.0000 mg | CHEWABLE_TABLET | Freq: Every day | ORAL | Status: DC
  Administered 2020-09-27 – 2020-09-28 (×2): 81 mg via ORAL
  Filled 2020-09-27 (×3): qty 1

## 2020-09-27 MED ORDER — SEVELAMER CARBONATE 800 MG PO TABS
1600.0000 mg | ORAL_TABLET | Freq: Three times a day (TID) | ORAL | Status: DC
  Administered 2020-09-28 – 2020-09-30 (×6): 1600 mg via ORAL
  Filled 2020-09-27 (×9): qty 2

## 2020-09-27 MED ORDER — HYDROCERIN EX CREA
1.0000 "application " | TOPICAL_CREAM | Freq: Two times a day (BID) | CUTANEOUS | Status: DC | PRN
  Filled 2020-09-27: qty 113

## 2020-09-27 MED ORDER — ACETAMINOPHEN 325 MG PO TABS
650.0000 mg | ORAL_TABLET | Freq: Four times a day (QID) | ORAL | Status: DC | PRN
  Administered 2020-09-28: 650 mg via ORAL
  Filled 2020-09-27: qty 2

## 2020-09-27 MED ORDER — MIDODRINE HCL 5 MG PO TABS
10.0000 mg | ORAL_TABLET | Freq: Three times a day (TID) | ORAL | Status: DC
  Administered 2020-09-27 – 2020-10-01 (×11): 10 mg via ORAL
  Filled 2020-09-27 (×12): qty 2

## 2020-09-27 MED ORDER — CLOPIDOGREL BISULFATE 75 MG PO TABS: 75.0000 mg | ORAL_TABLET | Freq: Every day | ORAL | Status: DC

## 2020-09-27 MED ORDER — SODIUM ZIRCONIUM CYCLOSILICATE 10 G PO PACK
10.0000 g | PACK | Freq: Once | ORAL | Status: DC
  Filled 2020-09-27: qty 1

## 2020-09-27 MED ORDER — EPINEPHRINE 1 MG/10ML IJ SOSY
PREFILLED_SYRINGE | INTRAMUSCULAR | Status: AC | PRN
  Administered 2020-09-27: 1 mg via INTRAVENOUS

## 2020-09-27 MED ORDER — AMIODARONE HCL IN DEXTROSE 360-4.14 MG/200ML-% IV SOLN
60.0000 mg/h | INTRAVENOUS | Status: AC
  Administered 2020-09-27: 60 mg/h via INTRAVENOUS
  Filled 2020-09-27: qty 200

## 2020-09-27 MED ORDER — METHYLPREDNISOLONE SODIUM SUCC 125 MG IJ SOLR
125.0000 mg | Freq: Once | INTRAMUSCULAR | Status: AC
  Administered 2020-09-27: 125 mg via INTRAVENOUS
  Filled 2020-09-27: qty 2

## 2020-09-27 MED ORDER — ATORVASTATIN CALCIUM 40 MG PO TABS
40.0000 mg | ORAL_TABLET | Freq: Every day | ORAL | Status: DC
  Administered 2020-09-27 – 2020-09-30 (×4): 40 mg via ORAL
  Filled 2020-09-27 (×4): qty 1

## 2020-09-27 MED ORDER — CHLORHEXIDINE GLUCONATE 0.12 % MT SOLN
15.0000 mL | Freq: Two times a day (BID) | OROMUCOSAL | Status: DC
  Administered 2020-09-27 – 2020-10-01 (×6): 15 mL via OROMUCOSAL
  Filled 2020-09-27 (×3): qty 15

## 2020-09-27 MED ORDER — FLUTICASONE FUROATE-VILANTEROL 100-25 MCG/INH IN AEPB
1.0000 | INHALATION_SPRAY | Freq: Every day | RESPIRATORY_TRACT | Status: DC
  Administered 2020-09-28 – 2020-10-01 (×3): 1 via RESPIRATORY_TRACT
  Filled 2020-09-27: qty 28

## 2020-09-27 MED ORDER — ORAL CARE MOUTH RINSE
15.0000 mL | Freq: Two times a day (BID) | OROMUCOSAL | Status: DC
  Administered 2020-09-27 – 2020-10-01 (×6): 15 mL via OROMUCOSAL

## 2020-09-27 MED ORDER — ALBUTEROL SULFATE HFA 108 (90 BASE) MCG/ACT IN AERS
2.0000 | INHALATION_SPRAY | Freq: Four times a day (QID) | RESPIRATORY_TRACT | Status: DC | PRN
  Filled 2020-09-27: qty 6.7

## 2020-09-27 MED ORDER — LAMOTRIGINE 150 MG PO TABS
150.0000 mg | ORAL_TABLET | Freq: Every day | ORAL | Status: DC
  Administered 2020-09-27 – 2020-09-30 (×4): 150 mg via ORAL
  Filled 2020-09-27 (×5): qty 1

## 2020-09-27 MED ORDER — FUROSEMIDE 10 MG/ML IJ SOLN: 80.0000 mg | Freq: Once | INTRAMUSCULAR | Status: AC

## 2020-09-27 MED ORDER — CLOPIDOGREL BISULFATE 75 MG PO TABS
75.0000 mg | ORAL_TABLET | Freq: Every day | ORAL | Status: DC
  Administered 2020-09-27 – 2020-09-30 (×4): 75 mg via ORAL
  Filled 2020-09-27 (×5): qty 1

## 2020-09-27 MED ORDER — PERFLUTREN LIPID MICROSPHERE
1.0000 mL | INTRAVENOUS | Status: AC | PRN
  Administered 2020-09-27: 2 mL via INTRAVENOUS
  Filled 2020-09-27: qty 10

## 2020-09-27 NOTE — Consult Note (Addendum)
NAME:  Connor Hoover, MRN:  [id number], DOB:  February 23, 1946, LOS: 0 ADMISSION DATE:  09/15/2020, CONSULTATION DATE:  11/28 REFERRING MD:  Dr. Christy Gentles, CHIEF COMPLAINT:  Cardiac Arrest    Brief History   74 y/o M who presented for AMS 11/28 after dosing of dilaudid for dressing change. Was admitted for evaluation by IMTS.  Developed 2 min VT arrest in ER with ROSC and return of mental status.   History of present illness   74 y/o M who presented to [facility] on 11/28 for AMS.   The patient is a poor historian but his wife provides detailed information.  He is followed at the [facility].  The patient developed what he thought was an in-grown toenail and it became infected requiring admission from 09/16/20 through 11/21 at the [place].  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 He was also identified to have worsening LVEF on ECHO, PAD on LLE requiring angioplasty.  He fell prior to leaving the hospital and had difficulty getting into the car for discharge - he required 3 people to get into the car. At discharge, he was started on lopressor, plavix and midodrine.  After returning home, he continued to have weakness, nausea and vomiting.  He was readmitted to [facility] from 11/24 - 11/26 for acute on chronic systolic CHF, weakness. Lopressor was held and he was noted to have an episode of asymptomatic VT on 11/25.  He is normally a T/Th/S HD patient and was changed to Monday for the week of Thanksgiving for the holiday. He was unable to make it to HD on Monday due to fatigue. He was discharged off his lopressor due to soft BP's.   Wife reports am of admit he woke and was short of breath.  She tried oxygen and CPAP but they did not relieve his dyspnea.  She reports he "passed out" and EMS was called. He became responsive during the 911 call and EMS transported him to the ER.  In the ER his labs were relatively normal.  Wife notes he had dilaudid on 11/27 at 4pm for a wound VAC  change.  In the ER, he had an episode of VT and required 2 min CPR with return of ROSC after 1 epi and normal mental status.  He was placed on BiPAP after for comfort.   PCCM called for evaluation.   Past Medical History  DM II  ESRD  Chronic Combined CHF / HFrEF  CAD - Inferior STEMI 10/2009 s/p PCI of RCA, s/p CABG PAD  Osteomyelitis - left great toe s/p amputation at [facility], d/c'd to SNF on 11/21.    Significant Hospital Events   11/28 Admit, 2 min VT arrest  Consults:  Cardiology   Procedures:    Significant Diagnostic Tests:    Micro Data:  COVID 11/28 >> negative  Influenza A/B 11/28 >> negative   Antimicrobials:    Interim history/subjective:  Pt reports feeling better. Wife at bedside  Afebrile  On BiPAP for comfort   Objective   Blood pressure (!) 87/65, pulse 72, temperature 97.9 F (36.6 C), temperature source Oral, resp. rate 17, height 5' 6.5" (1.689 m), weight 125.9 kg, SpO2 98 %.    Vent Mode: BIPAP;PCV FiO2 (%):  [40 %] 40 % Set Rate:  [12 bmp] 12 bmp PEEP:  [6 cmH20] 6 cmH20  No intake or output data in the 24 hours ending 09/28/2020 0722 Filed Weights   09/02/2020  0343  Weight: 125.9 kg    Examination: General: chronically ill appearing elderly male in NAD HEENT: MM pink/moist, BiPAP mask in place, anicteric   Neuro: AAOx4, speech clear, MAE, generalized weakness but non-focal  CV: s1s2 regular, wide QRS on monitor, 3/6 SEM PULM: non-labored on BiPAP, lungs bilaterally clear anterior GI: soft, bsx4 active  Extremities: warm/dry, BLE 1+ pitting edema, dark / hyperpigmented areas on LE's  Skin: no rashes or lesions  Resolved Hospital Problem list     Assessment & Plan:   Acute Metabolic Encephalopathy / Accidental Overdose  In setting of narcotic administration for recent amputation  -hold narcotics -supportive care -would use non-narcotic agent if possible for dressing changes   -follow neuro exam, back to baseline  -? lamictal  indication, could be contributing to periods of sedation  VT Arrest  2 min CPR in ER, s/p epi x1 -assess Mg+ now -monitor / replace electroltyes for goal K>4, Mg >2 -amiodarone IV per Cardiology  -tele monitoring in PCU   Chronic Hypotension  Chronic Combined CHF  Has been using midodrine T/Th/S for HD  -add midodrine 10mg  PO Q8 for now  -follow BP trend closely  -follow weight, missed HD on Monday 11/22  ESRD -will need Nephrology evaluation for HD -Trend BMP / urinary output -Replace electrolytes as indicated  COPD without Exacerbation  OSA  Has home CPAP, does not wear  -CPAP QHS    -continue Breo  HLD, Hx HTN -per primary   DM II  -per primary   L Great Toe Amputation  -WOC consult for wound care   Frequent Falls, Deconditioning  -PT consult -suspect he will need short term rehab before returning home based on recent hx of falls, multiple admits  Best practice (evaluated daily)  Diet: As tolerted  Pain/Anxiety/Delirium protocol (if indicated): n/a  VAP protocol (if indicated): n/a  DVT prophylaxis: per primary  GI prophylaxis: per primary  Glucose control: per primary  Mobility: As tolerated  last date of multidisciplinary goals of care discussion: per primary  Family and staff present: pending  Summary of discussion: pending Follow up goals of care discussion due: 12/5  Code Status: Full Code  Disposition: PCU  Labs   CBC: Recent Labs  Lab 09/23/20 1119 09/24/20 0248 09/25/20 0353 09/15/2020 0356  WBC 11.6* 8.7 9.5 13.1*  NEUTROABS  --  6.3  --  10.0*  HGB 10.4* 10.0* 11.0* 10.9*  HCT 31.6* 29.7* 33.1* 33.7*  MCV 94.9 92.5 94.0 96.8  PLT 249 225 249 638    Basic Metabolic Panel: Recent Labs  Lab 09/23/20 1119 09/24/20 0248 09/25/20 0353 09/25/20 1038 09/19/2020 0356  NA 132* 136 135  --  136  K 5.0 3.9 4.5  --  4.2  CL 92* 95* 92*  --  90*  CO2 24 25 26   --  26  GLUCOSE 133* 124* 123*  --  190*  BUN 35* 18 27*  --  16  CREATININE  9.51* 5.89* 7.66*  --  6.40*  CALCIUM 9.4 9.0 9.9  --  10.0  MG  --   --   --  2.5*  --   PHOS  --  4.1  --   --   --    GFR: Estimated Creatinine Clearance: 13.2 mL/min (A) (by C-G formula based on SCr of 6.4 mg/dL (H)). Recent Labs  Lab 09/23/20 1119 09/24/20 0248 09/25/20 0353 09/22/2020 0356  WBC 11.6* 8.7 9.5 13.1*    Liver Function Tests:  Recent Labs  Lab 09/23/20 1119 09/24/20 0248  AST 58*  --   ALT 25  --   ALKPHOS 164*  --   BILITOT 1.3*  --   PROT 7.0  --   ALBUMIN 3.1* 2.8*   No results for input(s): LIPASE, AMYLASE in the last 168 hours. No results for input(s): AMMONIA in the last 168 hours.  ABG    Component Value Date/Time   TCO2 29 11/15/2009 0821     Coagulation Profile: No results for input(s): INR, PROTIME in the last 168 hours.  Cardiac Enzymes: No results for input(s): CKTOTAL, CKMB, CKMBINDEX, TROPONINI in the last 168 hours.  HbA1C: Hgb A1c MFr Bld  Date/Time Value Ref Range Status  09/23/2020 11:19 AM 7.2 (H) 4.8 - 5.6 % Final    Comment:    (NOTE) Pre diabetes:          5.7%-6.4%  Diabetes:              >6.4%  Glycemic control for   <7.0% adults with diabetes     CBG: Recent Labs  Lab 09/24/20 1634 09/24/20 2313 09/25/20 0535 09/25/20 1211 09/25/20 1718  GLUCAP 134* 113* 122* 130* 106*    Review of Systems:   Gen: Denies fever, chills, weight change, fatigue, night sweats HEENT: Denies blurred vision, double vision, hearing loss, tinnitus, sinus congestion, rhinorrhea, sore throat, neck stiffness, dysphagia PULM: Denies shortness of breath, cough, sputum production, hemoptysis, wheezing CV: Denies chest pain, edema, orthopnea, paroxysmal nocturnal dyspnea, palpitations GI: Denies abdominal pain, nausea, vomiting, diarrhea, hematochezia, melena, constipation, change in bowel habits GU: Denies dysuria, hematuria, polyuria, oliguria, urethral discharge Endocrine: Denies hot or cold intolerance, polyuria, polyphagia or  appetite change Derm: Denies rash, dry skin, scaling or peeling skin change Heme: Denies easy bruising, bleeding, bleeding gums Neuro: Denies headache, numbness, weakness, slurred speech, loss of memory or consciousness MSK: LLE amputation pain  Past Medical History  He,  has a past medical history of Diabetes mellitus without complication ([place]), Hypertension, Peritoneal dialysis status ([place]), and PTSD (post-traumatic stress disorder).   Surgical History    Past Surgical History:  Procedure Laterality Date  . CARDIAC SURGERY       Social History   reports that he has quit smoking. He has quit using smokeless tobacco. He reports previous alcohol use. He reports previous drug use.   Family History   His family history is not on file.   Allergies Allergies  Allergen Reactions  . Lisinopril Swelling    Made the tongue swell and resulted in a trip to the ER  . Povidine [Povidone Iodine] Itching     Home Medications  Prior to Admission medications   Medication Sig Start Date End Date Taking? Authorizing Provider  acetaminophen (TYLENOL) 325 MG tablet Take 650 mg by mouth every 6 (six) hours as needed for mild pain or fever.    [provider]  albuterol (VENTOLIN HFA) 108 (90 Base) MCG/ACT inhaler Inhale 2 puffs into the lungs every 6 (six) hours as needed for wheezing or shortness of breath.    [provider]  ammonium lactate (LAC-HYDRIN) 12 % lotion Apply 1 application topically daily.    [provider]  aspirin 81 MG chewable tablet Chew 81 mg by mouth daily.    [provider]  atorvastatin (LIPITOR) 80 MG tablet Take 40 mg by mouth at bedtime.     [provider]  B Complex-C-Folic Acid (B COMPLEX-VITAMIN C-FOLIC ACID) 1 MG  tablet Take 1 tablet by mouth daily. 10/17/17   [provider]  bacitracin 500 UNIT/GM ointment Apply 1 application topically See admin instructions. Apply small amount topically every day for the  first week. Only apply after cleaning and before applying gauze dressing. Discontinue after first week.    [provider]  cinacalcet (SENSIPAR) 30 MG tablet Take 30 mg by mouth daily. With largest meal    [provider]  clopidogrel (PLAVIX) 75 MG tablet Take 75 mg by mouth daily.    [provider]  clotrimazole (LOTRIMIN) 1 % cream Apply 1 application topically daily.    [provider]  docusate sodium (COLACE) 50 MG capsule Take 50 mg by mouth 2 (two) times daily.    [provider]  Fluticasone-Salmeterol (ADVAIR) 100-50 MCG/DOSE AEPB Inhale 1 puff into the lungs 2 (two) times daily.    [provider]  hydrocerin (EUCERIN) CREA Apply 1 application topically 2 (two) times daily as needed.    [provider]  HYDROmorphone (DILAUDID) 2 MG tablet Take 2 mg by mouth every 6 (six) hours as needed for moderate pain or severe pain.    [provider]  insulin glargine (LANTUS) 100 UNIT/ML injection Inject 0.26 mLs (26 Units total) into the skin at bedtime. 09/25/20   [name], MD  insulin NPH-regular Human (70-30) 100 UNIT/ML injection Inject 4 Units into the skin in the morning, at noon, and at bedtime.    [provider]  ipratropium-albuterol (DUONEB) 0.5-2.5 (3) MG/3ML SOLN Take 3 mLs by nebulization in the morning and at bedtime.    [provider]  lamoTRIgine (LAMICTAL) 150 MG tablet Take 150 mg by mouth daily.    [provider]  lidocaine (LMX) 4 % cream Apply 1 application topically as needed. Patient taking differently: Apply 1 application topically 2 (two) times daily as needed.  09/15/19   Joy, Shawn C, PA-C  midodrine (PROAMATINE) 10 MG tablet Take 1 pill with food before dialysis on Tuesday, Thursday and Saturday. 09/25/20   [name], MD  Multiple Vitamin (RENAL MULTIVITAMIN/ZINC PO) Take 1 tablet by mouth daily with supper.    [provider]  sevelamer carbonate  (RENVELA) 800 MG tablet Take 1,600 mg by mouth 3 (three) times daily with meals.    [provider]  triamcinolone (KENALOG) 0.1 % Apply 1 application topically 2 (two) times daily.    [provider]     Critical care time: n/a     Noe Gens, MSN, NP-C, AGACNP-BC [facility] 08/31/2020, 7:23 AM   Please see [email or web address] for pager details.

## 2020-09-27 NOTE — ED Notes (Signed)
I notified the admitting doctors that were in the room with pt of the pt's low bp and obtained orders for a ns 500 ml bolus.

## 2020-09-27 NOTE — ED Provider Notes (Signed)
[facility] EMERGENCY DEPARTMENT Provider Note   CSN: [id number] Arrival date & time: 09/19/2020  2947     History Chief Complaint  Patient presents with  . Drug Overdose  Level 5 caveat due to altered mental status  [facility] is a 74 y.o. male.  The history is provided by the patient.  Drug Overdose This is a new problem. The problem occurs constantly. The problem has been gradually improving. Nothing aggravates the symptoms. Relieved by: narcan.  Patient with history of diabetes, hypertension, ESRD on dialysis, recent left toe amputation presents with altered mental status presumed to be accidental overdose.  Patient is currently on Dilaudid tablets for pain control at home, and was noted tonight to be altered and having difficulty breathing.  On EMS arrival, patient was unresponsive pinpoint pupils.  Patient was given Narcan IM with improvement of his mental status. Patient had dialysis earlier in the day    Past Medical History:  Diagnosis Date  . Diabetes mellitus without complication ([place])   . Hypertension   . Peritoneal dialysis status ([place])   . PTSD (post-traumatic stress disorder)     Patient Active Problem List   Diagnosis Date Noted  . Acute on chronic systolic (congestive) heart failure ([place]) 09/23/2020  . DIABETES MELLITUS, TYPE II, ON INSULIN 11/30/2009  . HYPERLIPIDEMIA 11/30/2009  . OBESITY 11/30/2009  . HYPERTENSION 11/30/2009  . 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 CAD, NATIVE VESSEL 11/30/2009  . GERD 11/30/2009  . RENAL INSUFFICIENCY 11/30/2009  . TOBACCO USE, QUIT 11/30/2009    Past Surgical History:  Procedure Laterality Date  . CARDIAC SURGERY         No family history on file.  Social History   Tobacco Use  . Smoking status: Former Research scientist (life sciences)  . Smokeless tobacco: Former Network engineer  . Vaping Use: Never used  Substance Use Topics  . Alcohol use: Not Currently  . Drug use: Not  Currently    Home Medications Prior to Admission medications   Medication Sig Start Date End Date Taking? Authorizing Provider  acetaminophen (TYLENOL) 325 MG tablet Take 650 mg by mouth every 6 (six) hours as needed for mild pain or fever.    [provider]  albuterol (VENTOLIN HFA) 108 (90 Base) MCG/ACT inhaler Inhale 2 puffs into the lungs every 6 (six) hours as needed for wheezing or shortness of breath.    [provider]  ammonium lactate (LAC-HYDRIN) 12 % lotion Apply 1 application topically daily.    [provider]  aspirin 81 MG chewable tablet Chew 81 mg by mouth daily.    [provider]  atorvastatin (LIPITOR) 80 MG tablet Take 40 mg by mouth at bedtime.     [provider]  B Complex-C-Folic Acid (B COMPLEX-VITAMIN C-FOLIC ACID) 1 MG tablet Take 1 tablet by mouth daily. 10/17/17   [provider]  bacitracin 500 UNIT/GM ointment Apply 1 application topically See admin instructions. Apply small amount topically every day for the first week. Only apply after cleaning and before applying gauze dressing. Discontinue after first week.    [provider]  cinacalcet (SENSIPAR) 30 MG tablet Take 30 mg by mouth daily. With largest meal    [provider]  clopidogrel (PLAVIX) 75 MG tablet Take 75 mg by mouth daily.    [provider]  clotrimazole (LOTRIMIN) 1 % cream Apply 1 application topically daily.    [provider]  docusate sodium (  COLACE) 50 MG capsule Take 50 mg by mouth 2 (two) times daily.    [provider]  Fluticasone-Salmeterol (ADVAIR) 100-50 MCG/DOSE AEPB Inhale 1 puff into the lungs 2 (two) times daily.    [provider]  hydrocerin (EUCERIN) CREA Apply 1 application topically 2 (two) times daily as needed.    [provider]  HYDROmorphone (DILAUDID) 2 MG tablet Take 2 mg by mouth every 6 (six) hours as needed for moderate pain or severe pain.     [provider]  insulin glargine (LANTUS) 100 UNIT/ML injection Inject 0.26 mLs (26 Units total) into the skin at bedtime. 09/25/20   [name], MD  insulin NPH-regular Human (70-30) 100 UNIT/ML injection Inject 4 Units into the skin in the morning, at noon, and at bedtime.    [provider]  ipratropium-albuterol (DUONEB) 0.5-2.5 (3) MG/3ML SOLN Take 3 mLs by nebulization in the morning and at bedtime.    [provider]  lamoTRIgine (LAMICTAL) 150 MG tablet Take 150 mg by mouth daily.    [provider]  lidocaine (LMX) 4 % cream Apply 1 application topically as needed. Patient taking differently: Apply 1 application topically 2 (two) times daily as needed.  09/15/19   Joy, Shawn C, PA-C  midodrine (PROAMATINE) 10 MG tablet Take 1 pill with food before dialysis on Tuesday, Thursday and Saturday. 09/25/20   [name], MD  Multiple Vitamin (RENAL MULTIVITAMIN/ZINC PO) Take 1 tablet by mouth daily with supper.    [provider]  sevelamer carbonate (RENVELA) 800 MG tablet Take 1,600 mg by mouth 3 (three) times daily with meals.    [provider]  triamcinolone (KENALOG) 0.1 % Apply 1 application topically 2 (two) times daily.    [provider]    Allergies    Lisinopril and Povidine [povidone iodine]  Review of Systems   Review of Systems  Unable to perform ROS: Mental status change    Physical Exam Updated Vital Signs BP 100/67   Pulse 89   Temp 97.9 F (36.6 C) (Oral)   Resp (!) 24   Ht 1.689 m (5' 6.5")   Wt 125.9 kg   SpO2 95%   BMI 44.13 kg/m   Physical Exam CONSTITUTIONAL: Chronically ill-appearing HEAD: Normocephalic/atraumatic EYES: EOMI/PERRL ENMT: Mucous membranes moist NECK: supple no meningeal signs SPINE/BACK:entire spine nontender CV: S1/S2 noted, tachycardic LUNGS: Tachypnea, wheezing bilaterally ABDOMEN: soft, nontender, no rebound or guarding, bowel sounds noted throughout abdomen,  obese GU:no cva tenderness NEURO: Pt is somnolent but easily arousable will follow commands but minimally verbal.  Moves all extremities x4 EXTREMITIES: pulses normal/equal, wound VAC noted to left great toe, no erythema or drainage, dialysis access to left UE thrill noted SKIN: warm, color normal PSYCH: Unable to assess  ED Results / Procedures / Treatments   Labs (all labs ordered are listed, but only abnormal results are displayed) Labs Reviewed  BASIC METABOLIC PANEL - Abnormal; Notable for the following components:      Result Value   Chloride 90 (*)    Glucose, Bld 190 (*)    Creatinine, Ser 6.40 (*)    GFR, Estimated 9 (*)    Anion gap 20 (*)    All other components within normal limits  CBC WITH DIFFERENTIAL/PLATELET - Abnormal; Notable for the following components:   WBC 13.1 (*)    RBC 3.48 (*)    Hemoglobin 10.9 (*)    HCT 33.7 (*)    RDW 16.8 (*)  nRBC 2.0 (*)    Neutro Abs 10.0 (*)    Monocytes Absolute 1.4 (*)    Basophils Absolute 0.2 (*)    Abs Immature Granulocytes 0.30 (*)    All other components within normal limits  BRAIN NATRIURETIC PEPTIDE - Abnormal; Notable for the following components:   B Natriuretic Peptide 1,039.7 (*)    All other components within normal limits  RESP PANEL BY RT-PCR (FLU A&B, COVID) ARPGX2  I-STAT VENOUS BLOOD GAS, ED    EKG EKG Interpretation  Date/Time:  Sunday September 27 2020 03:54:19 EST Ventricular Rate:  90 PR Interval:    QRS Duration: 205 QT Interval:  482 QTC Calculation: 590 R Axis:   -83 Text Interpretation: Sinus or ectopic atrial rhythm Prolonged PR interval RBBB and LAFB No significant change since last tracing Confirmed by Ripley Fraise [phone]) on 09/18/2020 4:06:41 AM   Radiology DG Chest Port 1 View  Result Date: 09/09/2020 CLINICAL DATA:  Shortness of breath EXAM: PORTABLE CHEST 1 VIEW COMPARISON:  09/14/2020 FINDINGS: Postoperative changes in the mediastinum. Cardiac enlargement. Improving  vascular congestion. Atelectasis in the left lung base with possible small left pleural effusion. No progression. IMPRESSION: Cardiac enlargement with improving vascular congestion. Atelectasis in the left base with possible small left pleural effusion, unchanged. Electronically Signed   By: Lucienne Capers M.D.   On: 09/28/2020 06:34   DG Chest Port 1 View  Result Date: 09/24/2020 CLINICAL DATA:  Shortness of breath.  Drug overdose. EXAM: PORTABLE CHEST 1 VIEW COMPARISON:  09/23/2020 FINDINGS: Postoperative changes in the mediastinum. Diffuse cardiac enlargement. Mild vascular congestion. No edema or consolidation. Suggestion of atelectasis in the left lung base. Possible pleural effusion or thickening in the costophrenic angles. IMPRESSION: Cardiac enlargement with mild vascular congestion. No edema or consolidation. Atelectasis in the left lung base. Possible small pleural effusions or thickening in the costophrenic angles. Electronically Signed   By: Lucienne Capers M.D.   On: 09/22/2020 04:14    Procedures .Critical Care Performed by: Ripley Fraise, MD Authorized by: Ripley Fraise, MD   Critical care provider statement:    Critical care time (minutes):  45   Critical care start time:  09/04/2020 4:00 AM   Critical care end time:  09/06/2020 4:45 AM   Critical care time was exclusive of:  Separately billable procedures and treating other patients   Critical care was necessary to treat or prevent imminent or life-threatening deterioration of the following conditions:  Renal failure, respiratory failure and CNS failure or compromise   Critical care was time spent personally by me on the following activities:  Development of treatment plan with patient or surrogate, discussions with consultants, evaluation of patient's response to treatment, examination of patient, obtaining history from patient or surrogate, review of old charts, re-evaluation of patient's condition, pulse oximetry,  ordering and review of radiographic studies, ordering and review of laboratory studies and ordering and performing treatments and interventions   I assumed direction of critical care for this patient from another provider in my specialty: no      CPR Procedure Note I PERSONALLY DIRECTED ANCILLARY STAFF OR/PERFORMED CPR IN AN EFFORT TO REGAIN RETURN OF SPONTANEOUS CIRCULATION IN AN EFFORT TO MAINTAIN NEURO, CARDIAC AND SYSTEMIC PERFUSION  Medications Ordered in ED Medications  amiodarone (NEXTERONE) 1.8 mg/mL load via infusion 150 mg (has no administration in time range)  amiodarone (NEXTERONE PREMIX) 360-4.14 MG/200ML-% (1.8 mg/mL) IV infusion (has no administration in time range)    Followed by  amiodarone (  NEXTERONE PREMIX) 360-4.14 MG/200ML-% (1.8 mg/mL) IV infusion (has no administration in time range)  albuterol (PROVENTIL,VENTOLIN) solution continuous neb (10 mg/hr Nebulization Given 09/09/2020 0433)  ipratropium (ATROVENT) nebulizer solution 0.5 mg (0.5 mg Nebulization Given 09/21/2020 0433)  methylPREDNISolone sodium succinate (SOLU-MEDROL) 125 mg/2 mL injection 125 mg (125 mg Intravenous Given 09/18/2020 0427)  EPINEPHrine (ADRENALIN) 1 MG/10ML injection (1 mg Intravenous Given 09/20/2020 8841)    ED Course  I have reviewed the triage vital signs and the nursing notes.  Pertinent labs & imaging results that were available during my care of the patient were reviewed by me and considered in my medical decision making (see chart for details).    MDM Rules/Calculators/A&P                          3:54 AM Patient seen on arrival for presumed accidental overdose.  Patient appears drowsy, tachypneic and wheezing bilaterally Labs, x-ray ordered.  Patient be given nebulized therapies and steroids.  Patient will likely need to be to the hospital He was just recently inpatient at this hospital 6:07 AM Patient has continued to improve.  He is awake and alert respiratory effort is improved 6:20  AM I was called to room as patient was in V-tach    On my arrival, CPR in progress Pt given epinephrine, and during rhythm check, pt regained spontaneous circulation Repeat EKG   EKG Interpretation  Date/Time:  Sunday September 27 2020 06:11:50 EST Ventricular Rate:  114 PR Interval:    QRS Duration: 194 QT Interval:  426 QTC Calculation: 587 R Axis:   -88 Text Interpretation: Junctional tachycardia Right bundle branch block Abnormal T, consider ischemia, lateral leads Baseline wander in lead(s) V1 No significant change since last tracing Confirmed by Ripley Fraise [phone]) on 09/19/2020 6:19:47 AM      Pt now awake/alert, but tachypneic Will consult cardiology Potassium noted to be normal Will place on bipap to help resp. Effort Repeat CXR pending 6:41 AM Discussed with Dr. Toney Rakes with cardiology, he will see the patient. Patient remains to be awake alert this time. 6:57 AM Seen by Dr. Toney Rakes with cardiology. He recommends amiodarone bolus and infusion Discussed with pharmacy they report no changes needed for renal failure Patient continues remain awake alert and is on BiPAP. Cardiologist recommends critical care admission. 7:16 AM Discussed with critical care who will evaluate the patient. Final Clinical Impression(s) / ED Diagnoses Final diagnoses:  Accidental drug overdose, initial encounter  ESRD (end stage renal disease) ([place])  COPD exacerbation ([place])  Ventricular tachycardia [facility])    Rx / DC Orders ED Discharge Orders    None       Ripley Fraise, MD 09/28/2020 [phone]

## 2020-09-27 NOTE — Progress Notes (Signed)
  Amiodarone Drug - Drug Interaction Consult Note  Recommendations: No current interactions noted.  Monitor as medications are added or resumed from home.   Amiodarone is metabolized by the cytochrome P450 system and therefore has the potential to cause many drug interactions. Amiodarone has an average plasma half-life of 50 days (range 20 to 100 days).   There is potential for drug interactions to occur several weeks or months after stopping treatment and the onset of drug interactions may be slow after initiating amiodarone.   []  Statins: Increased risk of myopathy. Simvastatin- restrict dose to 20mg  daily. Other statins: counsel patients to report any muscle pain or weakness immediately.  []  Anticoagulants: Amiodarone can increase anticoagulant effect. Consider warfarin dose reduction. Patients should be monitored closely and the dose of anticoagulant altered accordingly, remembering that amiodarone levels take several weeks to stabilize.  []  Antiepileptics: Amiodarone can increase plasma concentration of phenytoin, the dose should be reduced. Note that small changes in phenytoin dose can result in large changes in levels. Monitor patient and counsel on signs of toxicity.  []  Beta blockers: increased risk of bradycardia, AV block and myocardial depression. Sotalol - avoid concomitant use.  []   Calcium channel blockers (diltiazem and verapamil): increased risk of bradycardia, AV block and myocardial depression.  []   Cyclosporine: Amiodarone increases levels of cyclosporine. Reduced dose of cyclosporine is recommended.  []  Digoxin dose should be halved when amiodarone is started.  []  Diuretics: increased risk of cardiotoxicity if hypokalemia occurs.  []  Oral hypoglycemic agents (glyburide, glipizide, glimepiride): increased risk of hypoglycemia. Patient's glucose levels should be monitored closely when initiating amiodarone therapy.   []  Drugs that prolong the QT interval:  Torsades de  pointes risk may be increased with concurrent use - avoid if possible.  Monitor QTc, also keep magnesium/potassium WNL if concurrent therapy can't be avoided. Marland Kitchen Antibiotics: e.g. fluoroquinolones, erythromycin. . Antiarrhythmics: e.g. quinidine, procainamide, disopyramide, sotalol. . Antipsychotics: e.g. phenothiazines, haloperidol.  . Lithium, tricyclic antidepressants, and methadone. Thank You,  Brain Hilts  09/12/2020 7:01 AM

## 2020-09-27 NOTE — ED Triage Notes (Signed)
[facility] BIB [facility] from home with wife, wound vac to L foot s/p foot sx, hx ESRD HD M/W/S, called out for breathing problems, found unresponsive by EMS, pinpoint pupils, 2 mg IM Narcan and NRB placed, responded after Narcan. Had dialysis today.    122/88 CBG 167 20 RR 97.1 oral ETCO2 41

## 2020-09-27 NOTE — Progress Notes (Signed)
Unsuccessful in obtaining ABG x3. Notified MD. Per MD; okay to get VBG.

## 2020-09-27 NOTE — H&P (Signed)
Date: 09/04/2020               Patient Name:  Connor Hoover MRN: [id number]  DOB: May 13, 1946 Age / Sex: 74 y.o., male   PCP: [place] Service: Internal Medicine Teaching Service         Attending Physician: Dr. Jimmye Norman, Elaina Pattee, MD    First Contact: Dr. Meredith Staggers Teola Felipe Pager: [phone]  Second Contact: Dr. [name] Pager: [phone]       After Hours (After 5p/  First Contact Pager: [phone]  weekends / holidays): Second Contact Pager: [phone]   Chief Complaint: Altered Mental status  History of Present Illness: Connor Hoover is a 74 yo M w/PMH of ESRD on HD (Tue, Thurs, Sat) , DM 2, HFrEF ( EF 35% in 2020), PAD s/p angioplasty, recent OM of L great toe s/p amputation, presenting to [facility] for altered mental status. He was recently admitted to [facility] from 11/24 -11/26  for worsening weakness which improved with dialysis.  Patient was in his usual state of health until this morning when noted to be asking for oxygen and altered mental status. Patient's wife noted that he had dialysis session yesterday and after this, he took pain medication for his toe around 4pm. He went to bed following this with his CPAP around 9:30PM. Around 2:30AM, patient woke up the wife when he took off his CPAP and was having trouble breathing. She increased his oxygen (from 3L to 5L) via CPAP but patient did not respond well to this. She notes that he was altered completely and became unresponsive. Patient's wife called EMS.  Two days ago, patient noted to have wheezing.  Denies fever, chills, nausea, vomiting, SOB, cough.   ED Course: On EMS arrival, pt was unresponsive pinpoint pupils- improvement in his mental satus with IM Narcan. On arrival pt was drowsy, tachypnea, wheezing BL- improved with nebulized therapies and steroids. Around 6 am pt was in V-tach: Received CPR, Epinephrine, regained spontaneous circulation.    Meds:  Current Meds  Medication Sig  . acetaminophen  (TYLENOL) 325 MG tablet Take 650 mg by mouth every 6 (six) hours as needed for mild pain or fever.  Marland Kitchen albuterol (VENTOLIN HFA) 108 (90 Base) MCG/ACT inhaler Inhale 2 puffs into the lungs every 6 (six) hours as needed for wheezing or shortness of breath.  Marland Kitchen ammonium lactate (LAC-HYDRIN) 12 % lotion Apply 1 application topically daily.  Marland Kitchen aspirin 81 MG chewable tablet Chew 81 mg by mouth daily.  Marland Kitchen atorvastatin (LIPITOR) 80 MG tablet Take 40 mg by mouth at bedtime.   . B Complex-C-Folic Acid (B COMPLEX-VITAMIN C-FOLIC ACID) 1 MG tablet Take 1 tablet by mouth daily.  . bacitracin 500 UNIT/GM ointment Apply 1 application topically See admin instructions. Apply small amount topically every day for the first week. Only apply after cleaning and before applying gauze dressing. Discontinue after first week.  . cinacalcet (SENSIPAR) 30 MG tablet Take 30 mg by mouth daily. With largest meal  . clopidogrel (PLAVIX) 75 MG tablet Take 75 mg by mouth daily.  . clotrimazole (LOTRIMIN) 1 % cream Apply 1 application topically daily.  Marland Kitchen docusate sodium (COLACE) 50 MG capsule Take 50 mg by mouth 2 (two) times daily.  . Fluticasone-Salmeterol (ADVAIR) 100-50 MCG/DOSE AEPB Inhale 1 puff into the lungs 2 (two) times daily.  . hydrocerin (EUCERIN) CREA Apply 1 application topically 2 (two) times daily as needed (itching).   Marland Kitchen  HYDROmorphone (DILAUDID) 2 MG tablet Take 2 mg by mouth every 6 (six) hours as needed for moderate pain or severe pain.  Marland Kitchen insulin glargine (LANTUS) 100 UNIT/ML injection Inject 0.26 mLs (26 Units total) into the skin at bedtime.  . insulin NPH-regular Human (70-30) 100 UNIT/ML injection Inject 4 Units into the skin in the morning, at noon, and at bedtime.  Marland Kitchen ipratropium-albuterol (DUONEB) 0.5-2.5 (3) MG/3ML SOLN Take 3 mLs by nebulization in the morning and at bedtime.  . lamoTRIgine (LAMICTAL) 150 MG tablet Take 150 mg by mouth daily.  Marland Kitchen lidocaine (LMX) 4 % cream Apply 1 application topically as  needed. (Patient taking differently: Apply 1 application topically 2 (two) times daily as needed (dry skin). )  . midodrine (PROAMATINE) 10 MG tablet Take 1 pill with food before dialysis on Tuesday, Thursday and Saturday.  . Multiple Vitamin (RENAL MULTIVITAMIN/ZINC PO) Take 1 tablet by mouth daily with supper.  . sevelamer carbonate (RENVELA) 800 MG tablet Take 1,600 mg by mouth 3 (three) times daily with meals.  . triamcinolone (KENALOG) 0.1 % Apply 1 application topically 2 (two) times daily.     Allergies: Allergies as of 09/12/2020 - Review Complete 09/07/2020  Allergen Reaction Noted  . Lisinopril Swelling 11/04/2016  . Povidine [povidone iodine] Itching 08/23/2018   Past Medical History:  Diagnosis Date  . Cardiomyopathy ([place])   . Diabetes mellitus without complication ([place])   . ESRD (end stage renal disease) ([place])    Hemodialysis T, Th, Sat  . Hypertension   . Osteomyelitis ([place])   . PTSD (post-traumatic stress disorder)     Family History: Mother had lung cancer.  Social History: Lives with 2nd wife, 2 sons from 30st wife. Ambulates with walker at baseline. Denies alcohol, tobacco, illicit substance abuse.   Review of Systems: A complete ROS was negative except as per HPI.   Physical Exam: Blood pressure 100/62, pulse 83, temperature 98 F (36.7 C), temperature source Axillary, resp. rate 18, height 5' 6.5" (1.689 m), weight 125.9 kg, SpO2 100 %.  General: Well developed, chronically ill-apperaing obese male lying in bed on Bipap, in no acute distress.  HEENT: NCAT head, hearing intact, MMM Neck: Supple, ROM Intact, no JVD Cardiovascular: RRR, S1, S2 normal, no murmur Pulmonary: CTAB, no wheezing Abdomen: soft, non- tender Extremities: 1+ pitting edema- mid tibia, L foot wound, Peripheral pulses faint on L foot. Skin: Dry, warm, normal turgor, chronic venous stasis changes.  Neuro: AAO*3 Psych: Normal mood and affect, normal judgement and thought content.     EKG: personally reviewed my interpretation demonstrates  left anterior fascicular block, RBBB, first-degree AV block and Atrial fibrillation.  CXR: personally reviewed my interpretation and shows cardiac enlargement with improving vascular congestion, atelectasis in the left base with possible small left pleural effusion, unchanged.    Assessment & Plan by Problem: Active Problems:   Acute exacerbation of CHF (congestive heart failure) [facility])  Mr. Nicol is a 74 yo M w/PMH of ESRD on HD (Tue, Thurs, Sat) , DM 2, HFrEF ( EF 35% in 2020), PAD s/p angioplasty, recent OM of L great toe s/p amputation, presenting to [facility] for altered mental status.  # Acute on chronic systolic heart failure #ESRD on TuThuSa (MWF for holiday week) # V-tach Patient had a witnessed VT arrest today in ER requiring CPR and single dose of epinephrine. Electrolytes seems to be okay. QTc is elevated to 569 ms. At thsi point thinking ischemic etiology. As per cards recommendation pt. Is  not an ICD candidate due to his ESRD. They suggest heart catheterization prior to discharge to see if any disease which would require intervention.  Prior Echo in 2020 with EF of 30% and wife reports another with EF 25%.  Follows with Dr.Benshimhon and [facility]. Used to get peritoneal dialysis, now getting hemodialysis. Last dialysis session 09/26/20 and took midodrine before session.  Nephro on board and planning dialysis tomorrow if K still high after receiving Lokelma for K 5.4.  - Appreciate cardio assistance - Appreciate nephro assistance - F/U Echocardiogram - Trend electrolytes - IV Amiodarone 16.7 - Lokelma  -PT/PT eval - C/w home meds: midodrine 10mg  with dialysis - Hold metoprolol   L great toe osteomyelitis s/p amputation S/p amputation on recent admission at [place]. Discharge summary show no further antibiotics prescribed. Wound vac in place and functioning. No purulent drainage, surrounding warmth. Currently afebrile.    - Wound care consult - Monitor vitals - Trend cbc - If febrile, can get blood culture, imaging, start abx - Holdhome meds: Oral dilaudid prn for pain  Peripheral Arterial Disease Discharge summary describes angioplasty performed in LLE during recent admission. Unclear which vessel. Diminished but palpable pulses on exam. ON DAPT for 30 days with aspirin, plavix (until 10/20/20). - C/w home meds: aspirin 81mg  daily, clopidogrel 75mg  daily, atorvastatin 40mg  daily  OSA Confirmed via sleep study in July - C/w CPAP  T2DM On lantus 26 units qhs, 6 units NPH. Current glucose 277 - Lantus 25 units qhs - Novolog TID w meals - SSI, glucose checks  COPD - C/w home meds: albuterol, duoneb prn, fluticasone/solumedrol   DVT prophx: Enoxaparin  Diet: Renal Code: Full Dispo: Admit patient to Inpatient with expected length of stay greater than 2 midnights.  Signed: Honor Junes, MD 09/14/2020, 2:30 PM  Pager: [phone] After 5pm on weekdays and 1pm on weekends: On Call pager: [phone]

## 2020-09-27 NOTE — Consult Note (Signed)
[facility] Renal Consultation Note    Indication for Consultation:  Management of ESRD/hemodialysis, anemia, hypertension/volume, and secondary hyperparathyroidism. PCP:  HPI: Connor Hoover is a 74 y.o. male with ESRD, T2DM, obesity, OSA, CAD (s/p CABG 2017 @ [facility], re-do CABG 2018 @[facility] ), and ischemic CM (EF 35%) followed by [facility]. He is being admitted s/p VT arrest.  Just admitted here 11/24 - 09/25/20 with weakness s/p toe amputation. Found to be hypotensive. Metoprolol stopped and midodrine was added pre-HD. On 11/25, he did have 3 min run of NSVT with LOC but no loss of pulse before returning to baseline without intervention.  He was discharged home on 11/26. Went to dialysis as usual on 11/27 - completed a full treatment, but post weight was 118kg (2kg above dry weight). Pt wearing Bipap and unable to give full Hx. Per notes, after returning home, he took a pain pill for toe pain and went to bed. Around 1am, his wife noted that he had removed his bi-pap and was having issues breathing before becoming unresponsive. EMS called - noted to be unresponsive with pin-point pupils, narcan given with improvement. In the ED, he had a VT arrest requiring CPR/epi before ROSC after 2 min. Labs from 3am this morning with K 4.2, Mg 2.5, Ca 10, WBC 13.2, Hgb 10.9.   Seen in room this morning - wearing bipap, but appears comfortable. Echo being started. CXR 11/28 with improved vascular congestion from prior, small L pleural effusion.  Cardiology has been consulted -- looks like plan is for IV amiodarone, with echo and heart cath pending.  Dialyzes on TTS schedule at [facility] - as above, had full dialysis yesterday. K is borderline today 5.4 after CPR.   Past Medical History:  Diagnosis Date  . Cardiomyopathy ([place])   . Diabetes mellitus without complication ([place])   . ESRD (end stage renal disease) ([place])    Hemodialysis T, Th, Sat  . Hypertension   . Osteomyelitis ([place])   .  PTSD (post-traumatic stress disorder)    Past Surgical History:  Procedure Laterality Date  . AMPUTATION     Great toe amputation.    Marland Kitchen CARDIAC SURGERY     CABG and redo   History reviewed. No pertinent family history. Social History:  reports that he has quit smoking. He has quit using smokeless tobacco. He reports previous alcohol use. He reports previous drug use.  ROS: As per HPI otherwise negative.  Physical Exam: Vitals:   09/22/2020 0905 09/25/2020 0930 09/13/2020 0945 09/10/2020 0958  BP: 95/68 100/67 100/62   Pulse: 83  83   Resp: 18     Temp:    98 F (36.7 C)  TempSrc:    Axillary  SpO2: 100%  100%   Weight:      Height:         General: Well developed, NAD. Wearing BiPAP. Head: Normocephalic, atraumatic, sclera non-icteric, mucus membranes are moist. Neck: Supple without lymphadenopathy/masses. JVD not elevated. Lungs: Clear bilaterally to auscultation without wheezes, rales, or rhonchi. Breathing is unlabored. Heart: RRR with normal S1, S2. No murmurs, rubs, or gallops appreciated. Abdomen: Soft, non-tender, non-distended with normoactive bowel sounds.  Musculoskeletal:  Strength and tone appear normal for age. Lower extremities: L foot bandaged with wound vac to toe, scaling skin on ankle, 1+ LE edema Neuro: Alert and oriented X 3. Moves all extremities spontaneously.  Dialysis Access: LUE AVF + thrill  Allergies  Allergen Reactions  . Lisinopril Swelling  Made the tongue swell and resulted in a trip to the ER  . Povidine [Povidone Iodine] Itching   Prior to Admission medications   Medication Sig Start Date End Date Taking? Authorizing Provider  acetaminophen (TYLENOL) 325 MG tablet Take 650 mg by mouth every 6 (six) hours as needed for mild pain or fever.   Yes [provider]  albuterol (VENTOLIN HFA) 108 (90 Base) MCG/ACT inhaler Inhale 2 puffs into the lungs every 6 (six) hours as needed for wheezing or shortness of breath.   Yes [provider]  ammonium lactate (LAC-HYDRIN) 12 % lotion Apply 1 application topically daily.   Yes [provider]  aspirin 81 MG chewable tablet Chew 81 mg by mouth daily.   Yes [provider]  atorvastatin (LIPITOR) 80 MG tablet Take 40 mg by mouth at bedtime.    Yes [provider]  B Complex-C-Folic Acid (B COMPLEX-VITAMIN C-FOLIC ACID) 1 MG tablet Take 1 tablet by mouth daily. 10/17/17  Yes [provider]  bacitracin 500 UNIT/GM ointment Apply 1 application topically See admin instructions. Apply small amount topically every day for the first week. Only apply after cleaning and before applying gauze dressing. Discontinue after first week.   Yes [provider]  cinacalcet (SENSIPAR) 30 MG tablet Take 30 mg by mouth daily. With largest meal   Yes [provider]  clopidogrel (PLAVIX) 75 MG tablet Take 75 mg by mouth daily.   Yes [provider]  clotrimazole (LOTRIMIN) 1 % cream Apply 1 application topically daily.   Yes [provider]  docusate sodium (COLACE) 50 MG capsule Take 50 mg by mouth 2 (two) times daily.   Yes [provider]  Fluticasone-Salmeterol (ADVAIR) 100-50 MCG/DOSE AEPB Inhale 1 puff into the lungs 2 (two) times daily.   Yes [provider]  hydrocerin (EUCERIN) CREA Apply 1 application topically 2 (two) times daily as needed (itching).    Yes [provider]  HYDROmorphone (DILAUDID) 2 MG tablet Take 2 mg by mouth every 6 (six) hours as needed for moderate pain or severe pain.   Yes [provider]  insulin glargine (LANTUS) 100 UNIT/ML injection Inject 0.26 mLs (26 Units total) into the skin at bedtime. 09/25/20  Yes Aslam, Loralyn Freshwater, MD  insulin NPH-regular Human (70-30) 100 UNIT/ML injection Inject 4 Units into the skin in the morning, at noon, and at bedtime.   Yes [provider]  ipratropium-albuterol (DUONEB) 0.5-2.5 (3) MG/3ML SOLN Take 3 mLs by  nebulization in the morning and at bedtime.   Yes [provider]  lamoTRIgine (LAMICTAL) 150 MG tablet Take 150 mg by mouth daily.   Yes [provider]  lidocaine (LMX) 4 % cream Apply 1 application topically as needed. Patient taking differently: Apply 1 application topically 2 (two) times daily as needed (dry skin).  09/15/19  Yes Joy, Shawn C, PA-C  midodrine (PROAMATINE) 10 MG tablet Take 1 pill with food before dialysis on Tuesday, Thursday and Saturday. 09/25/20  Yes Aslam, Loralyn Freshwater, MD  Multiple Vitamin (RENAL MULTIVITAMIN/ZINC PO) Take 1 tablet by mouth daily with supper.   Yes [provider]  sevelamer carbonate (RENVELA) 800 MG tablet Take 1,600 mg by mouth 3 (three) times daily with meals.   Yes [provider]  triamcinolone (KENALOG) 0.1 % Apply 1 application topically 2 (two) times daily.   Yes [provider]   Current Facility-Administered Medications  Medication Dose Route Frequency Provider Last Rate Last Admin  .  acetaminophen (TYLENOL) tablet 650 mg  650 mg Oral Q6H PRN [name], MD       Or  . acetaminophen (TYLENOL) suppository 650 mg  650 mg Rectal Q6H PRN Aslam, Sadia, MD      . albuterol (VENTOLIN HFA) 108 (90 Base) MCG/ACT inhaler 2 puff  2 puff Inhalation Q6H PRN Aslam, Sadia, MD      . amiodarone (NEXTERONE PREMIX) 360-4.14 MG/200ML-% (1.8 mg/mL) IV infusion  60 mg/hr Intravenous Continuous Ripley Fraise, MD 33.3 mL/hr at 09/14/2020 0737 60 mg/hr at 09/19/2020 0737   Followed by  . amiodarone (NEXTERONE PREMIX) 360-4.14 MG/200ML-% (1.8 mg/mL) IV infusion  30 mg/hr Intravenous Continuous Ripley Fraise, MD      . amiodarone (NEXTERONE) 1.8 mg/mL load via infusion 150 mg  150 mg Intravenous Once Ripley Fraise, MD      . ammonium lactate (LAC-HYDRIN) 12 % lotion 1 application  1 application Topical Daily Aslam, Sadia, MD      . aspirin chewable tablet 81 mg  81 mg Oral Daily Aslam, Sadia, MD      . atorvastatin  (LIPITOR) tablet 40 mg  40 mg Oral QHS Aslam, Sadia, MD      . cinacalcet (SENSIPAR) tablet 30 mg  30 mg Oral Q supper Aslam, Sadia, MD      . clopidogrel (PLAVIX) tablet 75 mg  75 mg Oral Daily Aslam, Sadia, MD      . enoxaparin (LOVENOX) injection 30 mg  30 mg Subcutaneous Q24H Aslam, Sadia, MD      . fluticasone furoate-vilanterol (BREO ELLIPTA) 100-25 MCG/INH 1 puff  1 puff Inhalation Daily Aslam, Sadia, MD      . hydrocerin (EUCERIN) cream 1 application  1 application Topical BID PRN Aslam, Sadia, MD      . ipratropium-albuterol (DUONEB) 0.5-2.5 (3) MG/3ML nebulizer solution 3 mL  3 mL Nebulization Q6H PRN Aslam, Sadia, MD      . lamoTRIgine (LAMICTAL) tablet 150 mg  150 mg Oral Daily Aslam, Sadia, MD      . midodrine (PROAMATINE) tablet 10 mg  10 mg Oral TID WC Ollis, Brandi L, NP      . sevelamer carbonate (RENVELA) tablet 1,600 mg  1,600 mg Oral TID WC [name], MD       Labs: Basic Metabolic Panel: Recent Labs  Lab 09/24/20 0248 09/24/20 0248 09/25/20 0353 09/13/2020 0356 09/02/2020 0923  NA 136   < > 135 136 133*  K 3.9   < > 4.5 4.2 5.4*  CL 95*  --  92* 90*  --   CO2 25  --  26 26  --   GLUCOSE 124*  --  123* 190*  --   BUN 18  --  27* 16  --   CREATININE 5.89*  --  7.66* 6.40*  --   CALCIUM 9.0  --  9.9 10.0  --   PHOS 4.1  --   --   --   --    < > = values in this interval not displayed.   Liver Function Tests: Recent Labs  Lab 09/23/20 1119 09/24/20 0248  AST 58*  --   ALT 25  --   ALKPHOS 164*  --   BILITOT 1.3*  --   PROT 7.0  --   ALBUMIN 3.1* 2.8*   CBC: Recent Labs  Lab 09/23/20 1119 09/23/20 1119 09/24/20 0248 09/24/20 0248 09/25/20 0353 09/13/2020 0356 09/13/2020 0923  WBC 11.6*   < > 8.7  --  9.5 13.1*  --   NEUTROABS  --   --  6.3  --   --  10.0*  --   HGB 10.4*   < > 10.0*   < > 11.0* 10.9* 12.2*  HCT 31.6*   < > 29.7*   < > 33.1* 33.7* 36.0*  MCV 94.9  --  92.5  --  94.0 96.8  --   PLT 249   < > 225  --  249 302  --    < > = values in  this interval not displayed.   CBG: Recent Labs  Lab 09/24/20 2313 09/25/20 0535 09/25/20 1211 09/25/20 1718 09/06/2020 1056  GLUCAP 113* 122* 130* 106* 277*   Studies/Results: DG Chest Port 1 View  Result Date: 09/02/2020 CLINICAL DATA:  Shortness of breath EXAM: PORTABLE CHEST 1 VIEW COMPARISON:  09/28/2020 FINDINGS: Postoperative changes in the mediastinum. Cardiac enlargement. Improving vascular congestion. Atelectasis in the left lung base with possible small left pleural effusion. No progression. IMPRESSION: Cardiac enlargement with improving vascular congestion. Atelectasis in the left base with possible small left pleural effusion, unchanged. Electronically Signed   By: Lucienne Capers M.D.   On: 09/23/2020 06:34   DG Chest Port 1 View  Result Date: 09/03/2020 CLINICAL DATA:  Shortness of breath.  Drug overdose. EXAM: PORTABLE CHEST 1 VIEW COMPARISON:  09/23/2020 FINDINGS: Postoperative changes in the mediastinum. Diffuse cardiac enlargement. Mild vascular congestion. No edema or consolidation. Suggestion of atelectasis in the left lung base. Possible pleural effusion or thickening in the costophrenic angles. IMPRESSION: Cardiac enlargement with mild vascular congestion. No edema or consolidation. Atelectasis in the left lung base. Possible small pleural effusions or thickening in the costophrenic angles. Electronically Signed   By: Lucienne Capers M.D.   On: 09/02/2020 04:14   Dialysis Orders:  TTS at [facility] -- full HD yesterday 11/27 4:15hr, 450/800, EDW 116kg, 2K/2Ca, AVF, heparin 8000 bolus - Calcitriol 2.47mcg PO q HD - No ESA  Assessment/Plan: 1.  VT arrest with quick ROSC: Hx Short run NSVT during last admit. Cardiology following, on amiodarone with plan for LHC and echo. 2.  ESRD: Usual TTS schedule. Did get a full treatment yesterday. K 5.4 today - will give 1 dose Lokelma and plan to dialyze tomorrow if K still high sided, will assess in AM. 3.  Hypotension/volume: BP  low sided, takes midodrine pre-HD now. BB held last admit, on IV amio now. 4.  Anemia: Hgb 12.2 - no ESA needed. 5.  Metabolic bone disease: Ca 10, Phos ok - hold PO VDRA for now. 6.  CAD, ischemic CM  Veneta Penton, PA-C 09/08/2020, 11:31 AM  [employer]

## 2020-09-27 NOTE — Progress Notes (Signed)
  Echocardiogram 2D Echocardiogram has been performed.  Connor Hoover F 09/07/2020, 1:04 PM

## 2020-09-27 NOTE — Progress Notes (Signed)
Called to Pt. Room BiPap was off. Readjusted. PT C/O of SOB. Called RT to assess. BiPap on correctly. Pt still C/O of SOB. QTC in 560-570. BP 234/209. RR notified. IMTS notified. EKG done confirmed QTC. BP rechecked 91/63 Pt. Continues to C/O of SOB. IMTS at bedside 80 of lasix given. Pt to transfer to ICU. Wife at bedside updated on plan.

## 2020-09-27 NOTE — Progress Notes (Signed)
RT called to room to assess patient because BiPAP mask popped off. BiPAP mask was on correctly with no leak. Patient O2 sats 100%. RT adjusted settings to achieve higher tidal volumes. Patient is complaining of chest tightness but a breathing treatment is contraindicated at this time due to heart issues. Cardiology is on the way to assess patient.

## 2020-09-27 NOTE — Consult Note (Addendum)
Cardiology Consultation:   Patient ID: Connor Hoover MRN: [id number]; DOB: [birth year]  Admit date: 09/20/2020 Date of Consult: 09/20/2020  Primary Care Provider: [place] Cardiologist: Dr. [facility] Electrophysiologist:  None    Patient Profile:   Connor Hoover is a 74 y.o. male with a hx of CAD s/p CABG, HTN, HLD, DM 2, chronic combined systolic and diastolic heart failure, obstructive sleep apnea on CPAP, PTSD, ESRD on HD TThS and history of restrictive lung disease who is being seen today for the evaluation of VT arrest at the request of Dr. Christy Gentles  History of Present Illness:   Connor Hoover is a morbidly obese 74 year old male with past medical history of CAD s/p CABG, HTN, HLD, DM 2, chronic combined systolic and diastolic heart failure, obstructive sleep apnea on CPAP, PTSD, ESRD on HD TThS and history of restrictive lung disease.  He had a inferior STEMI in January 2011 and underwent PCI of RCA and staged PCI of mid LAD.  He underwent CABG (reportedly LIMA-LAD, SVG-OM1) at [facility] in 2017 and redo CABG (free RIMA-prox LAD, R radial-OM1) at [place] on 05/18/2017. Cath 05/17/2017 for NSTEMI showed 90% dLM, severe 90% prox LAD disease, 50% ost LCx, LIMA graft was patent however unclear which vessel it was grafted to, appears to be grafted to diagonal vessel although d/c note mentions LIMA-nowhere, occluded SVG graft, presumably to OM). EF 30-35% by echo on 05/17/2017. He required IABP during last CABG as EF was 10-15% on 05/19/2017 and remained on ECMO from 7/21-7/25/2018.  Echocardiogram in December 2020 showed EF 35%.  Myoview showed multiple fixed defect, no ischemia.  Patient was established with heart failure service in January 2021, at the time, he was quite deconditioned and quite sedentary at home.  Patient was recently admitted in mid November at [facility] due to osteomyelitis of the left toe.  He underwent  amputation of the left great toe and had a wound VAC placed.  Echocardiogram obtained during the admission showed worsening ejection fraction.  Per wife's report, EF went down to 25%.  He was eventually discharged on metoprolol succinate 25 mg daily, Plavix and midodrine 10 mg 3 times weekly in the morning of dialysis session.  Patient was readmitted to [facility] from 11/24-11/26 due to acute on chronic systolic heart failure and weakness after missing hemodialysis session.  During the hospitalization, note indicated he had an episode of nonsustained VT.  Unfortunately his metoprolol succinate had to be discontinued secondary to hypotension with systolic blood pressure in the 90s.  Patient was eventually discharged home 2 days ago on 09/25/2020.  He underwent hemodialysis on 11/27 and he took midodrine prior to the hemodialysis session.  Due to persistent peripheral pain, patient took Dilaudid.  Shortly after, he became unresponsive and had significant altered mental status.  EMS was called.  On arrival, he reportedly had pinpoint pupils and was given a Narcan with improvement of his mental status.  He was brought [facility] for further observation.  While talking to his wife in the ED, patient had a 2-minute onset of VT arrest.  CPR was immediately started and he was given a dose of epinephrine.  He quickly achieved ROSC.  Cardiology has been consulted for sustained VT episode.   Past Medical History:  Diagnosis Date  . Diabetes mellitus without complication ([place])   . Hypertension   . Peritoneal dialysis status ([place])   . PTSD (post-traumatic stress disorder)  Past Surgical History:  Procedure Laterality Date  . CARDIAC SURGERY       Home Medications:  Prior to Admission medications   Medication Sig Start Date End Date Taking? Authorizing Provider  acetaminophen (TYLENOL) 325 MG tablet Take 650 mg by mouth every 6 (six) hours as needed for mild pain or fever.    [provider]  albuterol (VENTOLIN HFA) 108 (90 Base) MCG/ACT inhaler Inhale 2 puffs into the lungs every 6 (six) hours as needed for wheezing or shortness of breath.    [provider]  ammonium lactate (LAC-HYDRIN) 12 % lotion Apply 1 application topically daily.    [provider]  aspirin 81 MG chewable tablet Chew 81 mg by mouth daily.    [provider]  atorvastatin (LIPITOR) 80 MG tablet Take 40 mg by mouth at bedtime.     [provider]  B Complex-C-Folic Acid (B COMPLEX-VITAMIN C-FOLIC ACID) 1 MG tablet Take 1 tablet by mouth daily. 10/17/17   [provider]  bacitracin 500 UNIT/GM ointment Apply 1 application topically See admin instructions. Apply small amount topically every day for the first week. Only apply after cleaning and before applying gauze dressing. Discontinue after first week.    [provider]  cinacalcet (SENSIPAR) 30 MG tablet Take 30 mg by mouth daily. With largest meal    [provider]  clopidogrel (PLAVIX) 75 MG tablet Take 75 mg by mouth daily.    [provider]  clotrimazole (LOTRIMIN) 1 % cream Apply 1 application topically daily.    [provider]  docusate sodium (COLACE) 50 MG capsule Take 50 mg by mouth 2 (two) times daily.    [provider]  Fluticasone-Salmeterol (ADVAIR) 100-50 MCG/DOSE AEPB Inhale 1 puff into the lungs 2 (two) times daily.    [provider]  hydrocerin (EUCERIN) CREA Apply 1 application topically 2 (two) times daily as needed.    [provider]  HYDROmorphone (DILAUDID) 2 MG tablet Take 2 mg by mouth every 6 (six) hours as needed for moderate pain or severe pain.    [provider]  insulin glargine (LANTUS) 100 UNIT/ML injection Inject 0.26 mLs (26 Units total) into the skin at bedtime. 09/25/20   [name], MD  insulin NPH-regular Human (70-30) 100 UNIT/ML injection Inject 4 Units into the skin in the morning, at  noon, and at bedtime.    [provider]  ipratropium-albuterol (DUONEB) 0.5-2.5 (3) MG/3ML SOLN Take 3 mLs by nebulization in the morning and at bedtime.    [provider]  lamoTRIgine (LAMICTAL) 150 MG tablet Take 150 mg by mouth daily.    [provider]  lidocaine (LMX) 4 % cream Apply 1 application topically as needed. Patient taking differently: Apply 1 application topically 2 (two) times daily as needed.  09/15/19   Joy, Shawn C, PA-C  midodrine (PROAMATINE) 10 MG tablet Take 1 pill with food before dialysis on Tuesday, Thursday and Saturday. 09/25/20   [name], MD  Multiple Vitamin (RENAL MULTIVITAMIN/ZINC PO) Take 1 tablet by mouth daily with supper.    [provider]  sevelamer carbonate (RENVELA) 800 MG tablet Take 1,600 mg by mouth 3 (three) times daily with meals.    [provider]  triamcinolone (KENALOG) 0.1 % Apply 1 application topically 2 (two) times daily.    [provider]    Inpatient Medications: Scheduled Meds: . amiodarone  150 mg Intravenous Once   Continuous Infusions: .  amiodarone 60 mg/hr (09/26/2020 0737)   Followed by  . amiodarone     PRN Meds:   Allergies:    Allergies  Allergen Reactions  . Lisinopril Swelling    Made the tongue swell and resulted in a trip to the ER  . Povidine [Povidone Iodine] Itching    Social History:   Social History   Socioeconomic History  . Marital status: Legally Separated    Spouse name: Not on file  . Number of children: Not on file  . Years of education: Not on file  . Highest education level: Not on file  Occupational History  . Not on file  Tobacco Use  . Smoking status: Former Research scientist (life sciences)  . Smokeless tobacco: Former Network engineer  . Vaping Use: Never used  Substance and Sexual Activity  . Alcohol use: Not Currently  . Drug use: Not Currently  . Sexual activity: Not on file  Other Topics Concern  . Not on file  Social History Narrative  .  Not on file   Social Determinants of Health   Financial Resource Strain:   . Difficulty of Paying Living Expenses: Not on file  Food Insecurity:   . Worried About Charity fundraiser in the Last Year: Not on file  . Ran Out of Food in the Last Year: Not on file  Transportation Needs:   . Lack of Transportation (Medical): Not on file  . Lack of Transportation (Non-Medical): Not on file  Physical Activity:   . Days of Exercise per Week: Not on file  . Minutes of Exercise per Session: Not on file  Stress:   . Feeling of Stress : Not on file  Social Connections:   . Frequency of Communication with Friends and Family: Not on file  . Frequency of Social Gatherings with Friends and Family: Not on file  . Attends Religious Services: Not on file  . Active Member of Clubs or Organizations: Not on file  . Attends Archivist Meetings: Not on file  . Marital Status: Not on file  Intimate Partner Violence:   . Fear of Current or Ex-Partner: Not on file  . Emotionally Abused: Not on file  . Physically Abused: Not on file  . Sexually Abused: Not on file    Family History:   History reviewed. No pertinent family history.   ROS:  Please see the history of present illness.   All other ROS reviewed and negative.     Physical Exam/Data:   Vitals:   09/10/2020 0630 09/28/2020 0730 09/28/2020 0735 09/25/2020 0738  BP: 105/87 (!) 89/64 (!) 94/32   Pulse: 63     Resp: 19 10  18   Temp:      TempSrc:      SpO2: 94%   100%  Weight:      Height:       No intake or output data in the 24 hours ending 09/01/2020 0816 Last 3 Weights 09/17/2020 09/25/2020 09/24/2020  Weight (lbs) 277 lb 9 oz 277 lb 8 oz 272 lb 0.8 oz  Weight (kg) 125.9 kg 125.873 kg 123.4 kg     Body mass index is 44.13 kg/m.  General:  Well nourished, well developed, in no acute distress, on Bipap HEENT: normal Lymph: no adenopathy Neck: no JVD Endocrine:  No thryomegaly Vascular: No carotid bruits; FA pulses 2+  bilaterally without bruits  Cardiac:  normal S1, S2; RRR; no murmur  Lungs:  clear to auscultation bilaterally, no  wheezing, rhonchi or rales  Abd: soft, nontender, no hepatomegaly  Ext: no edema Musculoskeletal:  No deformities, BUE and BLE strength normal and equal Skin: warm and dry  Neuro:  CNs 2-12 intact, no focal abnormalities noted Psych:  Normal affect   EKG:  The EKG was personally reviewed and demonstrates: Sinus rhythm, left anterior fascicular block, right bundle branch block, first-degree AV block and frequent PACs Telemetry:  Telemetry was personally reviewed and demonstrates: Largely sinus rhythm with frequent PACs and first-degree AV block  Relevant CV Studies:  LHC 05/17/17:   HEMODYNAMICS: Aortic pressure was 136/72 mmHg with mean 96 mmHg.   ANGIOGRAPHIC DATA:  1. Left main coronary artery: Large caliber vessel, distal LM has 90 % severe stenosis.   2. Left anterior descending coronary artery (LAD): large caliber vessel, has severe proxima; disease, gives 2 Diagonal branch vessels.   3. Left circumflex coronary artery: Large caliber vessel, has ostial disease extending from distal LM, it gives 2 large OM branch vessels.   4. Right coronary artery (RCA): Larg caliber vessel, dominant system. Gives PDA and PLV. Has luminal irregularities.   5. LIMA Graft: the graft is patent, however unclear which vessel it is grafted to. It appears to be grafted to diagonal branch vessel.   6. SVG Graft: It has flush occlusion ( presumed to be grafted to OM )   Cardiac surgery or interventional data: 80% LEFT MAIN STENOSIS, 90% PROXIMAL OSTIAL LAD LESION WITH 50% PROXIMAL OSTIAL CIRCUMFLEX LESION    Echo 05/17/17: The left ventricle is moderately dilated. The LV ejection fraction is severely decreased. Ejection Fraction = 30-35%. There is severe global hypokinesis of the left ventricle. Tissue Doppler pattern suggests abnormal relaxation.    Laboratory  Data:  High Sensitivity Troponin:  No results for input(s): TROPONINIHS in the last 720 hours.   Chemistry Recent Labs  Lab 09/24/20 0248 09/25/20 0353 09/08/2020 0356  NA 136 135 136  K 3.9 4.5 4.2  CL 95* 92* 90*  CO2 25 26 26   GLUCOSE 124* 123* 190*  BUN 18 27* 16  CREATININE 5.89* 7.66* 6.40*  CALCIUM 9.0 9.9 10.0  GFRNONAA 10* 7* 9*  ANIONGAP 16* 17* 20*    Recent Labs  Lab 09/23/20 1119 09/24/20 0248  PROT 7.0  --   ALBUMIN 3.1* 2.8*  AST 58*  --   ALT 25  --   ALKPHOS 164*  --   BILITOT 1.3*  --    Hematology Recent Labs  Lab 09/24/20 0248 09/25/20 0353 09/28/2020 0356  WBC 8.7 9.5 13.1*  RBC 3.21* 3.52* 3.48*  HGB 10.0* 11.0* 10.9*  HCT 29.7* 33.1* 33.7*  MCV 92.5 94.0 96.8  MCH 31.2 31.3 31.3  MCHC 33.7 33.2 32.3  RDW 15.9* 16.5* 16.8*  PLT 225 249 302   BNP Recent Labs  Lab 09/25/2020 0356  BNP 1,039.7*    DDimer No results for input(s): DDIMER in the last 168 hours.   Radiology/Studies:  DG Chest Port 1 View  Result Date: 09/06/2020 CLINICAL DATA:  Shortness of breath EXAM: PORTABLE CHEST 1 VIEW COMPARISON:  09/21/2020 FINDINGS: Postoperative changes in the mediastinum. Cardiac enlargement. Improving vascular congestion. Atelectasis in the left lung base with possible small left pleural effusion. No progression. IMPRESSION: Cardiac enlargement with improving vascular congestion. Atelectasis in the left base with possible small left pleural effusion, unchanged. Electronically Signed   By: Lucienne Capers M.D.   On: 09/26/2020 06:34   DG Chest [facility] 1 View  Result  Date: 09/19/2020 CLINICAL DATA:  Shortness of breath.  Drug overdose. EXAM: PORTABLE CHEST 1 VIEW COMPARISON:  09/23/2020 FINDINGS: Postoperative changes in the mediastinum. Diffuse cardiac enlargement. Mild vascular congestion. No edema or consolidation. Suggestion of atelectasis in the left lung base. Possible pleural effusion or thickening in the costophrenic angles. IMPRESSION: Cardiac  enlargement with mild vascular congestion. No edema or consolidation. Atelectasis in the left lung base. Possible small pleural effusions or thickening in the costophrenic angles. Electronically Signed   By: Lucienne Capers M.D.   On: 09/08/2020 04:14   DG CHEST PORT 1 VIEW  Result Date: 09/23/2020 CLINICAL DATA:  74 year old male with shortness of breath. EXAM: PORTABLE CHEST 1 VIEW COMPARISON:  Chest radiograph dated 05/31/2020 FINDINGS: Interval removal the right IJ dialysis catheter. There is cardiomegaly with vascular congestion. Small left pleural effusion and left lung base atelectasis or infiltrate. Overall interval improved aeration of the lungs and near complete resolution of the previously seen edema. No pneumothorax. Median sternotomy wires and CABG vascular clips. No acute osseous pathology. IMPRESSION: 1. Cardiomegaly with vascular congestion. Interval resolution of the edema. 2. Small left pleural effusion and left lung base atelectasis or infiltrate. Electronically Signed   By: Anner Crete M.D.   On: 09/23/2020 17:17     Assessment and Plan:   1. VT arrest  -Witnessed VT arrest in the presence of wife while sitting in the emergency room.  Patient initially came in with accidental overdose, however while waiting in the ED, will need to persistent VT requiring CPR and a single dose of epinephrine.  -Potassium 4.2.  Magnesium was 2.5 two days ago.  -Previous Myoview in December 2020 showed scars but no ischemia.  He denies any recent chest pain, although per his report, echocardiogram recently obtained at [facility] in [place] showed decreased LVEF.  -Amiodarone was started in the ED after the termination of VT.  Blood pressure remained low with systolic blood pressure in the 80s to 90s.  He would not be a good candidate for any AV nodal blocking agent unless midodrine can be uptitrated to daily dosing.    -We will discuss with MD, for the time being I think the best option is to  continue IV amiodarone and switch to oral amiodarone after loading  2. Accidental overdose: Altered mental status this morning after Dilaudid.  EMS arrived finding the patient unresponsive with pinpoint pupil.  He was treated with Narcan, mental status has improved. Unclear if he had VT at home which may caused some of the AMS  3. Chronic combined systolic and diastolic heart failure: BNP 1039.  Hard to assess his overall volume based on physical exam due to morbid obesity.  Volume managed by hemodialysis.  -We will need to request recent records from [facility] as patient reportedly had an echocardiogram there that showed EF has further dropped down to 25%.  4. CAD s/p CABG: CABG in 2017, with redo CABG at [facility] in 2018.  5. ESRD on HD: On dialysis Tuesday Thursday and Saturday.  Due to holiday reasons, for this week, his dialysis schedule was supposed to be Monday, Wednesday and Saturday.  He missed Monday session due to weakness and fatigue.  6. Hypertension: Currently hypotensive.  On midodrine 10 mg in the morning of dialysis  7. Hyperlipidemia  8. DM2  9. Restrictive lung disease    :[id number]}      New York Heart Association (NYHA) Functional Class NYHA Class IV  For questions or updates, please contact [place] Please consult [email or web address] for contact info under    Signed, Almyra Deforest, [place]  09/12/2020 8:16 AM   History and all data above reviewed.  Patient examined.  I agree with the findings as above.  The patient had a very complicated cardiac history as above.  He is taking pain medicines for his great toe amputation.  He took his Dilaudid for pain medication around 4 or 5 in the afternoon.  In the early a.m. around 1 AM his wife reported that he had taken his BiPAP off.  He was somewhat agitated and asking for oxygen by nasal cannula.  He did have unresponsiveness.  Called EMS.  He was given Narcan.  In the emergency room he had documented ventricular  tachycardia.  By the time the ER doctor arrived he been given some epinephrine and had apparent CPR was initiated.  He had a relatively quick return of consciousness.  He has been treated with IV amiodarone since.  He says he has not had any palpitations, presyncope or syncope.  He gets around slowly in his house but he is able to ambulate.  He has not had any chest pressure, neck or arm discomfort.  Of note he sets of documented nonsustained ventricular tachycardia in the past.  The patient exam reveals COR:RRR  ,  Lungs: Decreased breath sounds without wheezing or crackles,  Abd: Positive bowel sounds normal in frequency in pitch, bruits, rebound, guarding, Ext absent dorsalis pedis and posterior pulse bilaterally.  All available labs, radiology testing, previous records reviewed. Agree with documented assessment and plan.  Ventricular tachycardia: Electrolytes seem to be okay.  His QTC is very slightly elevated for his conduction disturbance.  He certainly could have an ischemic etiology for this event as he has severe disease as above.  For now I would continue the amiodarone.  He is not an ICD candidate with his complicated history including end-stage renal disease and issues with follow-up.  I would suggest that he needs a heart catheterization prior to discharge to see if there is any disease which would require obvious intervention.  We do not have a recent echocardiogram in our chart and should repeat this.    Ibraheem Hoover  10:00 AM  09/12/2020

## 2020-09-27 NOTE — Consult Note (Signed)
WOC consulted for NPWT dressing to the foot. Assumed placement at outside facility, no surgical notes in the record or otherwise indication of care for foot wounds.   Requested staff place patient on hospital unit because patient's insurance will not cover home unit while in patient.   Seen home unit/charger any other supplies home with patient for safe keeping.  Will add to [facility] nursing team follow up for dressing change Monday 09/28/20  [place], [place], [place]

## 2020-09-27 NOTE — Progress Notes (Addendum)
Critical care attending progress note   Patient seen when transferred to ICU by I [facility].  74 year old man with history of a ESRD and ischemic cardiomyopathy with recent worsening, ejection fraction approximately 25%.  Prior CABG and redo CABG.  Recent decompensation following toe amputation for osteomyelitis (ingrown toenail) recently admitted to [facility] for decompensated heart failure and discharged 11/26.  Attended dialysis as scheduled the next day and denies dyspnea.  Was brought in today for having a syncopal episode at home.  Per EMS report they were called for unresponsiveness.  The patient had apparently been given narcotic for toe pain the night before and had gone to sleep with a CPAP machine which had recently prescribed, but apparently the machine was off.  Breathing at the scene was agonal he was transported to hospital where he suffered a VT cardiac arrest.  No strips available but patient has a baseline left bundle branch block.  He was then started on BiPAP.  Favorable blood gas.  On examination, obese man appearing stated age in no distress verbally communicative.  Crackles bilaterally.  JVP elevated to angle of the jaw.  Heart sounds are distant.  Prominent LV and RV heave.   Stasis dermatitis both lower extremities with minimal ankle edema.  VAC dressing over bed of left great toe.   Assessment:  Patient presented with a apparent arrhythmic events.  This is terminated following CPR and epinephrine. He attended dialysis yesterday and is unclear to me whether he is truly significantly volume overloaded.  His BNP is around 1000, but given his ejection fraction this may be around his baseline (I have no prior values for comparison).  If he can be volume overloaded then he should be dialyzed emergently. I will discontinue the BiPAP and try him on nasal cannula with BiPAP at nighttime.  If he is able to tolerate nasal cannula that would suggest that he is not so significantly  volume overloaded that he would require renal replacement therapy tonight.  With regards to his worsening heart failure and recent arrhythmic event, we will continue IV amiodarone.  Troponins were essentially flat this afternoon making it earlier ischemic event unlikely.  Thus his arrhythmia is likely related to his underlying cardiomyopathy and possible hypoxia while sleeping off CPAP. Unlikely that he will have appropriate targets given his extensive prior revascularization history. Note is made of his bundle branch block and perhaps he is a candidate for cardiac resynchronization therapy. At this time he has a normal blood pressure and heart rate and does not not appear to be in cardiogenic shock so we will hold off on initiation of inotropes.  CRITICAL CARE Performed by: Kipp Brood   Total critical care time: 45 minutes  Critical care time was exclusive of separately billable procedures and treating other patients.  Critical care was necessary to treat or prevent imminent or life-threatening deterioration.  Critical care was time spent personally by me on the following activities: development of treatment plan with patient and/or surrogate as well as nursing, discussions with consultants, evaluation of patient's response to treatment, examination of patient, obtaining history from patient or surrogate, ordering and performing treatments and interventions, ordering and review of laboratory studies, ordering and review of radiographic studies, pulse oximetry, re-evaluation of patient's condition and participation in multidisciplinary rounds.  Kipp Brood, MD [facility] ICU Physician [place]  Pager: [phone] Mobile: [phone] After hours: [phone].

## 2020-09-27 NOTE — Progress Notes (Signed)
Patient transported from 6E02 to 2H08 on BiPAP. No issues noted.

## 2020-09-27 NOTE — ED Provider Notes (Signed)
Patient seen by critical care.  They feel patient can be admitted to internal medicine teaching service at stepdown.  Discussed with Dr. Demaris Callander with internal medicine who will see the patient.   Connor Fraise, MD 09/22/2020 [phone]

## 2020-09-27 NOTE — Progress Notes (Signed)
Pt placed on CPAP V60 for the night on home setting of 6 cmH2O 40% FIO2. Pts respiratory status is stable w/no distress noted at this time. RT will continue to monitor.

## 2020-09-28 DIAGNOSIS — I255 Ischemic cardiomyopathy: Secondary | ICD-10-CM | POA: Diagnosis not present

## 2020-09-28 DIAGNOSIS — D649 Anemia, unspecified: Secondary | ICD-10-CM

## 2020-09-28 DIAGNOSIS — I472 Ventricular tachycardia: Secondary | ICD-10-CM

## 2020-09-28 DIAGNOSIS — T402X1A Poisoning by other opioids, accidental (unintentional), initial encounter: Secondary | ICD-10-CM | POA: Diagnosis not present

## 2020-09-28 DIAGNOSIS — I5043 Acute on chronic combined systolic (congestive) and diastolic (congestive) heart failure: Secondary | ICD-10-CM

## 2020-09-28 DIAGNOSIS — N186 End stage renal disease: Secondary | ICD-10-CM | POA: Diagnosis not present

## 2020-09-28 DIAGNOSIS — I2510 Atherosclerotic heart disease of native coronary artery without angina pectoris: Secondary | ICD-10-CM | POA: Diagnosis not present

## 2020-09-28 DIAGNOSIS — J441 Chronic obstructive pulmonary disease with (acute) exacerbation: Secondary | ICD-10-CM

## 2020-09-28 DIAGNOSIS — R739 Hyperglycemia, unspecified: Secondary | ICD-10-CM

## 2020-09-28 DIAGNOSIS — T50901A Poisoning by unspecified drugs, medicaments and biological substances, accidental (unintentional), initial encounter: Secondary | ICD-10-CM

## 2020-09-28 LAB — CBC
HCT: 32 % — ABNORMAL LOW (ref 39.0–52.0)
Hemoglobin: 10.5 g/dL — ABNORMAL LOW (ref 13.0–17.0)
MCH: 31.4 pg (ref 26.0–34.0)
MCHC: 32.8 g/dL (ref 30.0–36.0)
MCV: 95.8 fL (ref 80.0–100.0)
Platelets: 301 10*3/uL (ref 150–400)
RBC: 3.34 MIL/uL — ABNORMAL LOW (ref 4.22–5.81)
RDW: 16.7 % — ABNORMAL HIGH (ref 11.5–15.5)
WBC: 18.9 10*3/uL — ABNORMAL HIGH (ref 4.0–10.5)
nRBC: 0.9 % — ABNORMAL HIGH (ref 0.0–0.2)

## 2020-09-28 LAB — COMPREHENSIVE METABOLIC PANEL
ALT: 50 U/L — ABNORMAL HIGH (ref 0–44)
AST: 71 U/L — ABNORMAL HIGH (ref 15–41)
Albumin: 3.2 g/dL — ABNORMAL LOW (ref 3.5–5.0)
Alkaline Phosphatase: 145 U/L — ABNORMAL HIGH (ref 38–126)
Anion gap: 21 — ABNORMAL HIGH (ref 5–15)
BUN: 22 mg/dL (ref 8–23)
CO2: 24 mmol/L (ref 22–32)
Calcium: 10.1 mg/dL (ref 8.9–10.3)
Chloride: 91 mmol/L — ABNORMAL LOW (ref 98–111)
Creatinine, Ser: 7.41 mg/dL — ABNORMAL HIGH (ref 0.61–1.24)
GFR, Estimated: 7 mL/min — ABNORMAL LOW (ref >60)
Glucose, Bld: 263 mg/dL — ABNORMAL HIGH (ref 70–99)
Potassium: 4.8 mmol/L (ref 3.5–5.1)
Sodium: 136 mmol/L (ref 135–145)
Total Bilirubin: 1.3 mg/dL — ABNORMAL HIGH (ref 0.3–1.2)
Total Protein: 7.1 g/dL (ref 6.5–8.1)

## 2020-09-28 LAB — MAGNESIUM: Magnesium: 2.6 mg/dL — ABNORMAL HIGH (ref 1.7–2.4)

## 2020-09-28 LAB — GLUCOSE, CAPILLARY
Glucose-Capillary: 237 mg/dL — ABNORMAL HIGH (ref 70–99)
Glucose-Capillary: 251 mg/dL — ABNORMAL HIGH (ref 70–99)
Glucose-Capillary: 276 mg/dL — ABNORMAL HIGH (ref 70–99)
Glucose-Capillary: 308 mg/dL — ABNORMAL HIGH (ref 70–99)
Glucose-Capillary: 368 mg/dL — ABNORMAL HIGH (ref 70–99)

## 2020-09-28 LAB — TSH: TSH: 7.498 u[IU]/mL — ABNORMAL HIGH (ref 0.350–4.500)

## 2020-09-28 MED ORDER — INSULIN ASPART 100 UNIT/ML ~~LOC~~ SOLN
0.0000 [IU] | SUBCUTANEOUS | Status: DC
  Administered 2020-09-28 (×2): 5 [IU] via SUBCUTANEOUS
  Administered 2020-09-28: 9 [IU] via SUBCUTANEOUS
  Administered 2020-09-28: 5 [IU] via SUBCUTANEOUS
  Administered 2020-09-29: 1 [IU] via SUBCUTANEOUS
  Administered 2020-09-29: 2 [IU] via SUBCUTANEOUS
  Administered 2020-09-29 (×2): 1 [IU] via SUBCUTANEOUS

## 2020-09-28 MED ORDER — AMOXICILLIN-POT CLAVULANATE 500-125 MG PO TABS
1.0000 | ORAL_TABLET | Freq: Two times a day (BID) | ORAL | Status: DC
  Administered 2020-09-28 – 2020-09-30 (×5): 500 mg via ORAL
  Filled 2020-09-28 (×6): qty 1

## 2020-09-28 MED ORDER — SODIUM CHLORIDE 0.9% FLUSH
3.0000 mL | Freq: Two times a day (BID) | INTRAVENOUS | Status: DC
  Administered 2020-09-29 – 2020-10-01 (×3): 3 mL via INTRAVENOUS

## 2020-09-28 MED ORDER — ALBUTEROL SULFATE HFA 108 (90 BASE) MCG/ACT IN AERS
2.0000 | INHALATION_SPRAY | Freq: Four times a day (QID) | RESPIRATORY_TRACT | Status: DC | PRN
  Filled 2020-09-28: qty 6.7

## 2020-09-28 NOTE — Consult Note (Addendum)
[place] Nurse Consult Note: Reason for Consult: Consult requested for left foot wound.  Pt is wearing a home negative pressure wound therapy machine.  It is charging and left at the bedside.   Wound type: Left anterior foot with full thickness wound.  Converted to hospital Vac machine and pt's home machine charging at the bedside in the room.  Measurement: 5X3.5X.3cm Wound bed: 20% yellow adipose, 80% red and moist Drainage (amount, consistency, odor) mod amt pink drainage Periwound: intact skin surrounding Dressing procedure/placement/frequency:  Pt was medicated for pain prior to the procedure and tolerated with minimal discomfort. Applied barrier ring around the wound to attempt to maintain a seal, then one piece black foam to 148mm cont suction.  [place] team will plan to change dressing Q M/W/F.  Topical treatment orders provided for staff nurses as follows when the patient is discharged: When pt is ready to discharge, remove hospital Vac and dressing and apply moist gauze dressing and kerlex, and send home NPWT device/charger home with family or document clearly where the items area and make sure to transfer with patient. Julien Girt MSN, RN, [facility], [place], [place]

## 2020-09-28 NOTE — Evaluation (Signed)
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 In the ER, he had an episode of VT and required 2 min CPR with return of ROSC after 1 epi. Admitted with acute metabolic encephalopathy after VT arrest with ischemic cardiomyopathy.   Clinical Impression  Pt has been in and out of the hospital 2 prior times in November. Most recently d/c home with [facility] services which had not yet been to home. Pt reports that he lives in single story home with 4 steps to enter with his wife. Pt reports we was able to get around his home with RW but that he has not been wearing his Darco shoe due to the fact that it places him off balance. PT asked pt wife to bring Darco shoe and his diabetic shoe for ambulation in future therapy sessions. Pt is min Ax2 for bed mobility, transfers and ambulation of 3 feet with RW. Pt requires constant multimodal cuing to maintain his weight bearing status. PT recommends resumption of HHPT services at discharge. PT will continue to follow acutely.     Follow Up Recommendations Home health PT;Supervision for mobility/OOB    Equipment Recommendations  None recommended by PT       Precautions / Restrictions Precautions Precautions: Fall Precaution Comments: LLE wound vac Required Braces or Orthoses: Other Brace Other Brace: DARCO shoe--L (pt reports not using due to balance issues, wife to bring ) Restrictions Weight Bearing Restrictions: Yes LLE Weight Bearing: Partial weight bearing LLE Partial Weight Bearing Percentage or Pounds: only place weight on L heel       Mobility  Bed Mobility Overal bed mobility: Needs  Assistance Bed Mobility: Supine to Sit     Supine to sit: Min assist;+2 for physical assistance;+2 for safety/equipment     General bed mobility comments: min A for bringing trunk into upright and pad scoot of hips to EoB    Transfers Overall transfer level: Needs assistance Equipment used: Rolling walker (2 wheeled) Transfers: Sit to/from Stand Sit to Stand: Min assist;+2 physical assistance         General transfer comment: minAx2 for steadying, with power up maximal vc for hand placement and keeping weightbearing through L heel from bed and from recliner  Ambulation/Gait Ambulation/Gait assistance: Min assist Gait Distance (Feet): 3 Feet Assistive device: Rolling walker (2 wheeled) Gait Pattern/deviations: Step-to pattern;Shuffle;Trunk flexed Gait velocity: slowed  Gait velocity interpretation: <1.31 ft/sec, indicative of household ambulator General Gait Details: minA for stepping to chair on his R side with maximal cuing for L foot placement         Balance Overall balance assessment: Needs assistance Sitting-balance support: No upper extremity supported;Feet supported Sitting balance-Leahy Scale: Fair     Standing balance support: Bilateral upper extremity supported Standing balance-Leahy Scale: Poor Standing balance comment: requires B UE support due to WB status                             Pertinent Vitals/Pain Pain Assessment: No/denies pain    Home Living Family/patient expects to be discharged to:: Private residence Living Arrangements: Alone Available Help at Discharge: Family;Available 24  hours/day Type of Home: House Home Access: Stairs to enter Entrance Stairs-Rails: None Entrance Stairs-Number of Steps: 4 - front Home Layout: One level Home Equipment: Walker - 4 wheels;Walker - 2 wheels;Bedside commode;Shower seat;Wheelchair - manual;Hand held shower head;Adaptive equipment      Prior Function Level of Independence: Independent  with assistive device(s)         Comments: walking with either rollator, assisted for all ADL since toe amputation     Hand Dominance   Dominant Hand: Right    Extremity/Trunk Assessment   Upper Extremity Assessment Upper Extremity Assessment: Defer to OT evaluation    Lower Extremity Assessment Lower Extremity Assessment: Generalized weakness       Communication   Communication: No difficulties  Cognition Arousal/Alertness: Awake/alert Behavior During Therapy: WFL for tasks assessed/performed Overall Cognitive Status: Impaired/Different from baseline                                        General Comments General comments (skin integrity, edema, etc.): VSS on 2L O2 via [place]    Exercises     Assessment/Plan    PT Assessment Patient needs continued PT services  PT Problem List Decreased strength;Decreased activity tolerance;Decreased balance;Decreased mobility;Decreased cognition;Decreased knowledge of use of DME;Decreased safety awareness;Decreased knowledge of precautions;Impaired sensation       PT Treatment Interventions DME instruction;Gait training;Stair training;Functional mobility training;Therapeutic activities;Therapeutic exercise;Balance training;Neuromuscular re-education;Cognitive remediation;Patient/family education    PT Goals (Current goals can be found in the Care Plan section)  Acute Rehab PT Goals Patient Stated Goal: to get home PT Goal Formulation: With patient Time For Goal Achievement: 10/12/20 Potential to Achieve Goals: Fair    Frequency Min 3X/week   Barriers to discharge Inaccessible home environment      Co-evaluation PT/OT/SLP Co-Evaluation/Treatment: Yes Reason for Co-Treatment: For patient/therapist safety PT goals addressed during session: Mobility/safety with mobility         AM-PAC PT "6 Clicks" Mobility  Outcome Measure Help needed turning from your back to your side while in a flat bed without using  bedrails?: A Little Help needed moving from lying on your back to sitting on the side of a flat bed without using bedrails?: A Little Help needed moving to and from a bed to a chair (including a wheelchair)?: A Little Help needed standing up from a chair using your arms (e.g., wheelchair or bedside chair)?: A Little Help needed to walk in hospital room?: A Little Help needed climbing 3-5 steps with a railing? : Total 6 Click Score: 16    End of Session Equipment Utilized During Treatment: Gait belt;Oxygen Activity Tolerance: Patient limited by fatigue;Other (comment) (decreased ability to maintain WB status with no DARCO shoe) Patient left: in chair;with call bell/phone within reach;with chair alarm set Nurse Communication: Mobility status PT Visit Diagnosis: Other abnormalities of gait and mobility (R26.89);Unsteadiness on feet (R26.81);Muscle weakness (generalized) (M62.81)    Time: 4431-5400 PT Time Calculation (min) (ACUTE ONLY): 31 min   Charges:   PT Evaluation $PT Eval Moderate Complexity: 1 Mod          Laurence Folz B. Migdalia Dk PT, DPT Acute Rehabilitation Services Pager [phone] Office [phone]   [place] 09/28/2020, 1:02 PM

## 2020-09-28 NOTE — Progress Notes (Signed)
CSW received consult that patients spouse wanted to speak with CSW. CSW met with patients spouse at bedside. Patients spouse asked CSW for help with completing POA paperwpork. CSW let patients spouse know she will call Chaplain to help with POA paperwork. Patient spouse thanked CSW.  CSW will continue to follow.

## 2020-09-28 NOTE — Plan of Care (Signed)

## 2020-09-28 NOTE — H&P (View-Only) (Signed)
Progress Note  Patient Name: Connor Hoover Date of Encounter: 09/28/2020  [facility] Cardiologist: Dr. Colen Darling. [place]  Subjective   Denies chest pain or shortness of breath today.  Says his leg is feeling better.  Inpatient Medications    Scheduled Meds: . amiodarone  150 mg Intravenous Once  . ammonium lactate  1 application Topical Daily  . aspirin  81 mg Oral Daily  . atorvastatin  40 mg Oral QHS  . chlorhexidine  15 mL Mouth Rinse BID  . Chlorhexidine Gluconate Cloth  6 each Topical Daily  . cinacalcet  30 mg Oral Q supper  . clopidogrel  75 mg Oral Daily  . enoxaparin (LOVENOX) injection  30 mg Subcutaneous Q24H  . fluticasone furoate-vilanterol  1 puff Inhalation Daily  . insulin aspart  0-6 Units Subcutaneous TID WC  . insulin glargine  25 Units Subcutaneous QHS  . lamoTRIgine  150 mg Oral Daily  . mouth rinse  15 mL Mouth Rinse q12n4p  . midodrine  10 mg Oral TID WC  . sevelamer carbonate  1,600 mg Oral TID WC  . sodium zirconium cyclosilicate  10 g Oral Once   Continuous Infusions: . amiodarone 30 mg/hr (09/28/20 0900)   PRN Meds: acetaminophen **OR** acetaminophen, albuterol, hydrocerin, ipratropium-albuterol   Vital Signs    Vitals:   09/28/20 0700 09/28/20 0800 09/28/20 0813 09/28/20 0900  BP: 102/71 99/66  95/73  Pulse: 70 67  74  Resp: 15 15    Temp:  98.2 F (36.8 C)    TempSrc:  Axillary    SpO2: 100% 100% 100% 100%  Weight:      Height:        Intake/Output Summary (Last 24 hours) at 09/28/2020 0930 Last data filed at 09/28/2020 0900 Gross per 24 hour  Intake 719.39 ml  Output 0 ml  Net 719.39 ml   Last 3 Weights 09/28/2020 09/06/2020 09/12/2020  Weight (lbs) 266 lb 12.1 oz 263 lb 10.7 oz 277 lb 9 oz  Weight (kg) 121 kg 119.6 kg 125.9 kg      Telemetry    Sinus rhythm, IVCD, rate approximately 70- Personally Reviewed  ECG    Sinus rhythm, first-degree AV block, IVCD QRS duration 164 ms, QTC borderline at  approximately 497 ms adjusted for wide QRS- Personally Reviewed  Physical Exam   GEN: No acute distress.   Neck:  Not clearly visualized Cardiac: RRR, no murmurs, heart sounds distant Respiratory:  Shallow breaths, coarse diffusely GI: Soft, nontender, non-distended  MS:  Trace pretibial edema, left toe amputation with wound VAC in place Neuro:  Nonfocal  Psych: Normal affect   Labs    High Sensitivity Troponin:   Recent Labs  Lab 09/06/2020 1649 08/31/2020 1828  TROPONINIHS 98* 91*      Chemistry Recent Labs  Lab 09/23/20 1119 09/23/20 1119 09/24/20 0248 09/25/20 0353 09/17/2020 0356 09/09/2020 0356 09/10/2020 0923 09/24/2020 1649 09/28/20 0042  NA 132*   < > 136   < > 136   < > 133* 136 136  K 5.0   < > 3.9   < > 4.2   < > 5.4* 4.4 4.8  CL 92*   < > 95*   < > 90*  --   --  91* 91*  CO2 24   < > 25   < > 26  --   --  24 24  GLUCOSE 133*   < > 124*   < > 190*  --   --  290* 263*  BUN 35*   < > 18   < > 16  --   --  19 22  CREATININE 9.51*   < > 5.89*   < > 6.40*  --   --  7.07* 7.41*  CALCIUM 9.4   < > 9.0   < > 10.0  --   --  10.1 10.1  PROT 7.0  --   --   --   --   --   --  7.6 7.1  ALBUMIN 3.1*   < > 2.8*  --   --   --   --  3.3* 3.2*  AST 58*  --   --   --   --   --   --  74* 71*  ALT 25  --   --   --   --   --   --  45* 50*  ALKPHOS 164*  --   --   --   --   --   --  158* 145*  BILITOT 1.3*  --   --   --   --   --   --  1.5* 1.3*  GFRNONAA 5*   < > 10*   < > 9*  --   --  8* 7*  ANIONGAP 16*   < > 16*   < > 20*  --   --  21* 21*   < > = values in this interval not displayed.     Hematology Recent Labs  Lab 09/25/20 0353 09/25/20 0353 09/12/2020 0356 09/23/2020 0923 09/28/20 0042  WBC 9.5  --  13.1*  --  18.9*  RBC 3.52*  --  3.48*  --  3.34*  HGB 11.0*   < > 10.9* 12.2* 10.5*  HCT 33.1*   < > 33.7* 36.0* 32.0*  MCV 94.0  --  96.8  --  95.8  MCH 31.3  --  31.3  --  31.4  MCHC 33.2  --  32.3  --  32.8  RDW 16.5*  --  16.8*  --  16.7*  PLT 249  --  302  --  301    < > = values in this interval not displayed.    BNP Recent Labs  Lab 09/15/2020 0356  BNP 1,039.7*     DDimer No results for input(s): DDIMER in the last 168 hours.   Radiology    DG Chest Port 1 View  Result Date: 09/23/2020 CLINICAL DATA:  Shortness of breath EXAM: PORTABLE CHEST 1 VIEW COMPARISON:  09/13/2020 FINDINGS: Postoperative changes in the mediastinum. Cardiac enlargement. Improving vascular congestion. Atelectasis in the left lung base with possible small left pleural effusion. No progression. IMPRESSION: Cardiac enlargement with improving vascular congestion. Atelectasis in the left base with possible small left pleural effusion, unchanged. Electronically Signed   By: Lucienne Capers M.D.   On: 09/03/2020 06:34   DG Chest Port 1 View  Result Date: 09/02/2020 CLINICAL DATA:  Shortness of breath.  Drug overdose. EXAM: PORTABLE CHEST 1 VIEW COMPARISON:  09/23/2020 FINDINGS: Postoperative changes in the mediastinum. Diffuse cardiac enlargement. Mild vascular congestion. No edema or consolidation. Suggestion of atelectasis in the left lung base. Possible pleural effusion or thickening in the costophrenic angles. IMPRESSION: Cardiac enlargement with mild vascular congestion. No edema or consolidation. Atelectasis in the left lung base. Possible small pleural effusions or thickening in the costophrenic angles. Electronically Signed   By: Lucienne Capers M.D.   On: 09/20/2020 04:14  ECHOCARDIOGRAM COMPLETE  Result Date: 09/20/2020    ECHOCARDIOGRAM REPORT   Patient Name:   Connor Hoover Date of Exam: 09/26/2020 Medical Rec #:  [id number]       Height:       66.5 in Accession #:    [id number]      Weight:       277.6 lb Date of Birth:  1946-03-06        BSA:          2.312 m Patient Age:    74 years        BP:           100/62 mmHg Patient Gender: M               HR:           74 bpm. Exam Location:  Inpatient Procedure: 2D Echo, Cardiac Doppler, Color Doppler and Intracardiac             Opacification Agent Indications:    R06.02 SOB  History:        Patient has prior history of Echocardiogram examinations, most                 recent 11/16/2009. Arrythmias:Cardiac Arrest;                 Signs/Symptoms:Hypotension.  Sonographer:    Merrie Roof RDCS Referring Phys: [id number] Raynaldo Opitz RAINES IMPRESSIONS  1. No mural thrombus with Definity contrast. Left ventricular ejection fraction, by estimation, is <20%. The left ventricle has severely decreased function. The left ventricle demonstrates global hypokinesis. The left ventricular internal cavity size was moderately dilated. Left ventricular diastolic parameters are consistent with Grade II diastolic dysfunction (pseudonormalization). Elevated left ventricular end-diastolic pressure.  2. Right ventricular systolic function is mildly reduced. The right ventricular size is normal. There is normal pulmonary artery systolic pressure.  3. Left atrial size was moderately dilated.  4. Right atrial size was severely dilated.  5. ? may be some degree of MS - aliasing noted during diastole in the LV suggesting restricted diastolic filling. The mitral valve was not well visualized. No evidence of mitral valve regurgitation. Moderate mitral annular calcification.  6. The aortic valve is tricuspid. Aortic valve regurgitation is not visualized.  7. Aortic dilatation noted. There is borderline dilatation of the ascending aorta, measuring 38 mm.  8. The inferior vena cava is dilated in size with <50% respiratory variability, suggesting right atrial pressure of 15 mmHg. FINDINGS  Left Ventricle: No mural thrombus with Definity contrast. Left ventricular ejection fraction, by estimation, is <20%. The left ventricle has severely decreased function. The left ventricle demonstrates global hypokinesis. Definity contrast agent was given IV to delineate the left ventricular endocardial borders. The left ventricular internal cavity size was moderately dilated. There is  no left ventricular hypertrophy. Left ventricular diastolic parameters are consistent with Grade II diastolic dysfunction (pseudonormalization). Elevated left ventricular end-diastolic pressure. Right Ventricle: The right ventricular size is normal. No increase in right ventricular wall thickness. Right ventricular systolic function is mildly reduced. There is normal pulmonary artery systolic pressure. The tricuspid regurgitant velocity is 1.86 m/s, and with an assumed right atrial pressure of 15 mmHg, the estimated right ventricular systolic pressure is 42.3 mmHg. Left Atrium: Left atrial size was moderately dilated. Right Atrium: Right atrial size was severely dilated. Pericardium: There is no evidence of pericardial effusion. Mitral Valve: ? may be some degree of MS - aliasing noted during diastole in the LV  suggesting restricted diastolic filling. The mitral valve was not well visualized. Moderate mitral annular calcification. No evidence of mitral valve regurgitation. Tricuspid Valve: The tricuspid valve is grossly normal. Tricuspid valve regurgitation is mild. Aortic Valve: The aortic valve is tricuspid. Aortic valve regurgitation is not visualized. Pulmonic Valve: The pulmonic valve was grossly normal. Pulmonic valve regurgitation is trivial. Aorta: Aortic dilatation noted. There is borderline dilatation of the ascending aorta, measuring 38 mm. Venous: The inferior vena cava is dilated in size with less than 50% respiratory variability, suggesting right atrial pressure of 15 mmHg. IAS/Shunts: The interatrial septum was not well visualized.  LEFT VENTRICLE PLAX 2D LVIDd:         6.20 cm      Diastology LVIDs:         5.50 cm      LV e' medial:    3.70 cm/s LV PW:         0.90 cm      LV E/e' medial:  24.9 LV IVS:        0.90 cm      LV e' lateral:   7.62 cm/s LVOT diam:     2.20 cm      LV E/e' lateral: 12.1 LV SV:         49 LV SV Index:   21 LVOT Area:     3.80 cm  LV Volumes (MOD) LV vol d, MOD A2C: 146.0  ml LV vol d, MOD A4C: 177.0 ml LV vol s, MOD A2C: 90.0 ml LV vol s, MOD A4C: 148.0 ml LV SV MOD A2C:     56.0 ml LV SV MOD A4C:     177.0 ml LV SV MOD BP:      46.2 ml RIGHT VENTRICLE          IVC RV Basal diam:  4.30 cm  IVC diam: 2.20 cm RV Mid diam:    4.10 cm TAPSE (M-mode): 1.3 cm LEFT ATRIUM             Index       RIGHT ATRIUM           Index LA diam:        4.40 cm 1.90 cm/m  RA Area:     31.80 cm LA Vol (A2C):   89.2 ml 38.59 ml/m RA Volume:   121.00 ml 52.35 ml/m LA Vol (A4C):   88.9 ml 38.46 ml/m LA Biplane Vol: 94.6 ml 40.92 ml/m  AORTIC VALVE LVOT Vmax:   68.50 cm/s LVOT Vmean:  42.600 cm/s LVOT VTI:    0.129 m  AORTA Ao Root diam: 3.20 cm Ao Asc diam:  3.80 cm MITRAL VALVE               TRICUSPID VALVE MV Area (PHT): 4.26 cm    TR Peak grad:   13.8 mmHg MV Decel Time: 178 msec    TR Vmax:        186.00 cm/s MV E velocity: 92.10 cm/s MV A velocity: 85.30 cm/s  SHUNTS MV E/A ratio:  1.08        Systemic VTI:  0.13 m                            Systemic Diam: 2.20 cm Lyman Bishop MD Electronically signed by Lyman Bishop MD Signature Date/Time: 09/09/2020/4:15:22 PM    Final     Cardiac Studies   Echocardiogram 09/15/2020 -Image  quality is suboptimal despite use of Definity -Best preserved wall motion is in the lateral wall, suggesting EF may be approximately 20%. -RV function is mildly reduced and RV appears mildly enlarged. -Atria are poorly visualized for measurement. -At least moderate tricuspid valve regurgitation -Mitral valve poorly visualized.  Patient Profile     74 y.o. 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  History of inferior STEMI in 2011 with intervention on a totally occluded RCA and staged PCI of the mid LAD.  Subsequent CABG, and 2018 had subsequent MI and underwent redo CABG.  Developed shock  requiring ECMO, subsequently required dialysis.  Admitted for accidental Dilaudid overdose, cardiology involved due to episode of sustained ventricular tachycardia requiring CPR and epinephrine. Assessment & Plan    1.  Ventricular tachycardia 2.  Ischemic cardiomyopathy 3.  CAD status post CABG 2017, redo CABG 2018 -Witnessed VT arrest in the emergency room requiring CPR and single dose of epinephrine.  Electrolytes were normal, no ischemia on last nuclear stress test December 2020.  EF felt to lower than prior, 25% at the [facility], approximately 20% obtained yesterday. -Would be best to perform ischemic evaluation given concern for worsening ejection fraction and ventricular tachycardia.  I have discussed this with Dr. Haroldine Laws who has seen the patient previously, we will plan for right and left heart cath with femoral access tomorrow.  He would like to discuss this with his wife, but he was agreeable to proceeding. -Continue amiodarone IV -Patient has hypotension, midodrine is being used, can continue.  4.  Accidental overdose on Dilaudid -Patient is mentating clearly today. 5.  ESRD on HD -Plan is for dialysis tomorrow, will coordinate with cath.   For questions or updates, please contact [place] Please consult [email or web address] for contact info under        Signed, Elouise Munroe, MD  09/28/2020, 9:30 AM    Discussed with patient and wife at bedside, will plan for [facility] tomorrow to coordinate with dialysis. To be scheduled with Dr. Haroldine Laws.  INFORMED CONSENT: I have reviewed the risks, indications, and alternatives to cardiac catheterization, possible angioplasty, and stenting with the patient. Risks include but are not limited to bleeding, infection, vascular injury, stroke, myocardial infection, arrhythmia, kidney injury, radiation-related injury in the case of prolonged fluoroscopy use, emergency cardiac surgery, and death. The patient understands the risks of serious  complication is 1-2 in 8182 with diagnostic cardiac cath and 1-2% or less with angioplasty/stenting.     CRITICAL CARE Performed by: Cherlynn Kaiser, MD   Total critical care time: 35 minutes   Critical care time was exclusive of separately billable procedures and treating other patients.   Critical care was necessary to treat or prevent imminent or life-threatening deterioration.   Critical care was time spent personally by me (independent of APPs or residents) on the following activities: development of treatment plan with patient and/or surrogate as well as nursing, discussions with consultants, evaluation of patient's response to treatment, examination of patient, obtaining history from patient or surrogate, ordering and performing treatments and interventions, ordering and review of laboratory studies, ordering and review of radiographic studies, pulse oximetry and re-evaluation of patient's condition.

## 2020-09-28 NOTE — Progress Notes (Addendum)
Progress Note  Patient Name: Connor Hoover Date of Encounter: 09/28/2020  [facility] Cardiologist: Dr. Colen Darling. [place]  Subjective   Denies chest pain or shortness of breath today.  Says his leg is feeling better.  Inpatient Medications    Scheduled Meds: . amiodarone  150 mg Intravenous Once  . ammonium lactate  1 application Topical Daily  . aspirin  81 mg Oral Daily  . atorvastatin  40 mg Oral QHS  . chlorhexidine  15 mL Mouth Rinse BID  . Chlorhexidine Gluconate Cloth  6 each Topical Daily  . cinacalcet  30 mg Oral Q supper  . clopidogrel  75 mg Oral Daily  . enoxaparin (LOVENOX) injection  30 mg Subcutaneous Q24H  . fluticasone furoate-vilanterol  1 puff Inhalation Daily  . insulin aspart  0-6 Units Subcutaneous TID WC  . insulin glargine  25 Units Subcutaneous QHS  . lamoTRIgine  150 mg Oral Daily  . mouth rinse  15 mL Mouth Rinse q12n4p  . midodrine  10 mg Oral TID WC  . sevelamer carbonate  1,600 mg Oral TID WC  . sodium zirconium cyclosilicate  10 g Oral Once   Continuous Infusions: . amiodarone 30 mg/hr (09/28/20 0900)   PRN Meds: acetaminophen **OR** acetaminophen, albuterol, hydrocerin, ipratropium-albuterol   Vital Signs    Vitals:   09/28/20 0700 09/28/20 0800 09/28/20 0813 09/28/20 0900  BP: 102/71 99/66  95/73  Pulse: 70 67  74  Resp: 15 15    Temp:  98.2 F (36.8 C)    TempSrc:  Axillary    SpO2: 100% 100% 100% 100%  Weight:      Height:        Intake/Output Summary (Last 24 hours) at 09/28/2020 0930 Last data filed at 09/28/2020 0900 Gross per 24 hour  Intake 719.39 ml  Output 0 ml  Net 719.39 ml   Last 3 Weights 09/28/2020 09/13/2020 09/26/2020  Weight (lbs) 266 lb 12.1 oz 263 lb 10.7 oz 277 lb 9 oz  Weight (kg) 121 kg 119.6 kg 125.9 kg      Telemetry    Sinus rhythm, IVCD, rate approximately 70- Personally Reviewed  ECG    Sinus rhythm, first-degree AV block, IVCD QRS duration 164 ms, QTC borderline at  approximately 497 ms adjusted for wide QRS- Personally Reviewed  Physical Exam   GEN: No acute distress.   Neck:  Not clearly visualized Cardiac: RRR, no murmurs, heart sounds distant Respiratory:  Shallow breaths, coarse diffusely GI: Soft, nontender, non-distended  MS:  Trace pretibial edema, left toe amputation with wound VAC in place Neuro:  Nonfocal  Psych: Normal affect   Labs    High Sensitivity Troponin:   Recent Labs  Lab 09/07/2020 1649 09/07/2020 1828  TROPONINIHS 98* 91*      Chemistry Recent Labs  Lab 09/23/20 1119 09/23/20 1119 09/24/20 0248 09/25/20 0353 09/22/2020 0356 09/16/2020 0356 09/24/2020 0923 09/11/2020 1649 09/28/20 0042  NA 132*   < > 136   < > 136   < > 133* 136 136  K 5.0   < > 3.9   < > 4.2   < > 5.4* 4.4 4.8  CL 92*   < > 95*   < > 90*  --   --  91* 91*  CO2 24   < > 25   < > 26  --   --  24 24  GLUCOSE 133*   < > 124*   < > 190*  --   --  290* 263*  BUN 35*   < > 18   < > 16  --   --  19 22  CREATININE 9.51*   < > 5.89*   < > 6.40*  --   --  7.07* 7.41*  CALCIUM 9.4   < > 9.0   < > 10.0  --   --  10.1 10.1  PROT 7.0  --   --   --   --   --   --  7.6 7.1  ALBUMIN 3.1*   < > 2.8*  --   --   --   --  3.3* 3.2*  AST 58*  --   --   --   --   --   --  74* 71*  ALT 25  --   --   --   --   --   --  45* 50*  ALKPHOS 164*  --   --   --   --   --   --  158* 145*  BILITOT 1.3*  --   --   --   --   --   --  1.5* 1.3*  GFRNONAA 5*   < > 10*   < > 9*  --   --  8* 7*  ANIONGAP 16*   < > 16*   < > 20*  --   --  21* 21*   < > = values in this interval not displayed.     Hematology Recent Labs  Lab 09/25/20 0353 09/25/20 0353 09/24/2020 0356 09/24/2020 0923 09/28/20 0042  WBC 9.5  --  13.1*  --  18.9*  RBC 3.52*  --  3.48*  --  3.34*  HGB 11.0*   < > 10.9* 12.2* 10.5*  HCT 33.1*   < > 33.7* 36.0* 32.0*  MCV 94.0  --  96.8  --  95.8  MCH 31.3  --  31.3  --  31.4  MCHC 33.2  --  32.3  --  32.8  RDW 16.5*  --  16.8*  --  16.7*  PLT 249  --  302  --  301    < > = values in this interval not displayed.    BNP Recent Labs  Lab 09/26/2020 0356  BNP 1,039.7*     DDimer No results for input(s): DDIMER in the last 168 hours.   Radiology    DG Chest Port 1 View  Result Date: 09/28/2020 CLINICAL DATA:  Shortness of breath EXAM: PORTABLE CHEST 1 VIEW COMPARISON:  09/12/2020 FINDINGS: Postoperative changes in the mediastinum. Cardiac enlargement. Improving vascular congestion. Atelectasis in the left lung base with possible small left pleural effusion. No progression. IMPRESSION: Cardiac enlargement with improving vascular congestion. Atelectasis in the left base with possible small left pleural effusion, unchanged. Electronically Signed   By: Lucienne Capers M.D.   On: 09/22/2020 06:34   DG Chest Port 1 View  Result Date: 08/31/2020 CLINICAL DATA:  Shortness of breath.  Drug overdose. EXAM: PORTABLE CHEST 1 VIEW COMPARISON:  09/23/2020 FINDINGS: Postoperative changes in the mediastinum. Diffuse cardiac enlargement. Mild vascular congestion. No edema or consolidation. Suggestion of atelectasis in the left lung base. Possible pleural effusion or thickening in the costophrenic angles. IMPRESSION: Cardiac enlargement with mild vascular congestion. No edema or consolidation. Atelectasis in the left lung base. Possible small pleural effusions or thickening in the costophrenic angles. Electronically Signed   By: Lucienne Capers M.D.   On: 09/13/2020 04:14  ECHOCARDIOGRAM COMPLETE  Result Date: 09/28/2020    ECHOCARDIOGRAM REPORT   Patient Name:   Connor Hoover Date of Exam: 08/31/2020 Medical Rec #:  [id number]       Height:       66.5 in Accession #:    [id number]      Weight:       277.6 lb Date of Birth:  April 11, 1946        BSA:          2.312 m Patient Age:    74 years        BP:           100/62 mmHg Patient Gender: M               HR:           74 bpm. Exam Location:  Inpatient Procedure: 2D Echo, Cardiac Doppler, Color Doppler and Intracardiac             Opacification Agent Indications:    R06.02 SOB  History:        Patient has prior history of Echocardiogram examinations, most                 recent 11/16/2009. Arrythmias:Cardiac Arrest;                 Signs/Symptoms:Hypotension.  Sonographer:    Merrie Roof RDCS Referring Phys: [id number] Raynaldo Opitz RAINES IMPRESSIONS  1. No mural thrombus with Definity contrast. Left ventricular ejection fraction, by estimation, is <20%. The left ventricle has severely decreased function. The left ventricle demonstrates global hypokinesis. The left ventricular internal cavity size was moderately dilated. Left ventricular diastolic parameters are consistent with Grade II diastolic dysfunction (pseudonormalization). Elevated left ventricular end-diastolic pressure.  2. Right ventricular systolic function is mildly reduced. The right ventricular size is normal. There is normal pulmonary artery systolic pressure.  3. Left atrial size was moderately dilated.  4. Right atrial size was severely dilated.  5. ? may be some degree of MS - aliasing noted during diastole in the LV suggesting restricted diastolic filling. The mitral valve was not well visualized. No evidence of mitral valve regurgitation. Moderate mitral annular calcification.  6. The aortic valve is tricuspid. Aortic valve regurgitation is not visualized.  7. Aortic dilatation noted. There is borderline dilatation of the ascending aorta, measuring 38 mm.  8. The inferior vena cava is dilated in size with <50% respiratory variability, suggesting right atrial pressure of 15 mmHg. FINDINGS  Left Ventricle: No mural thrombus with Definity contrast. Left ventricular ejection fraction, by estimation, is <20%. The left ventricle has severely decreased function. The left ventricle demonstrates global hypokinesis. Definity contrast agent was given IV to delineate the left ventricular endocardial borders. The left ventricular internal cavity size was moderately dilated. There is  no left ventricular hypertrophy. Left ventricular diastolic parameters are consistent with Grade II diastolic dysfunction (pseudonormalization). Elevated left ventricular end-diastolic pressure. Right Ventricle: The right ventricular size is normal. No increase in right ventricular wall thickness. Right ventricular systolic function is mildly reduced. There is normal pulmonary artery systolic pressure. The tricuspid regurgitant velocity is 1.86 m/s, and with an assumed right atrial pressure of 15 mmHg, the estimated right ventricular systolic pressure is 74.1 mmHg. Left Atrium: Left atrial size was moderately dilated. Right Atrium: Right atrial size was severely dilated. Pericardium: There is no evidence of pericardial effusion. Mitral Valve: ? may be some degree of MS - aliasing noted during diastole in the LV  suggesting restricted diastolic filling. The mitral valve was not well visualized. Moderate mitral annular calcification. No evidence of mitral valve regurgitation. Tricuspid Valve: The tricuspid valve is grossly normal. Tricuspid valve regurgitation is mild. Aortic Valve: The aortic valve is tricuspid. Aortic valve regurgitation is not visualized. Pulmonic Valve: The pulmonic valve was grossly normal. Pulmonic valve regurgitation is trivial. Aorta: Aortic dilatation noted. There is borderline dilatation of the ascending aorta, measuring 38 mm. Venous: The inferior vena cava is dilated in size with less than 50% respiratory variability, suggesting right atrial pressure of 15 mmHg. IAS/Shunts: The interatrial septum was not well visualized.  LEFT VENTRICLE PLAX 2D LVIDd:         6.20 cm      Diastology LVIDs:         5.50 cm      LV e' medial:    3.70 cm/s LV PW:         0.90 cm      LV E/e' medial:  24.9 LV IVS:        0.90 cm      LV e' lateral:   7.62 cm/s LVOT diam:     2.20 cm      LV E/e' lateral: 12.1 LV SV:         49 LV SV Index:   21 LVOT Area:     3.80 cm  LV Volumes (MOD) LV vol d, MOD A2C: 146.0  ml LV vol d, MOD A4C: 177.0 ml LV vol s, MOD A2C: 90.0 ml LV vol s, MOD A4C: 148.0 ml LV SV MOD A2C:     56.0 ml LV SV MOD A4C:     177.0 ml LV SV MOD BP:      46.2 ml RIGHT VENTRICLE          IVC RV Basal diam:  4.30 cm  IVC diam: 2.20 cm RV Mid diam:    4.10 cm TAPSE (M-mode): 1.3 cm LEFT ATRIUM             Index       RIGHT ATRIUM           Index LA diam:        4.40 cm 1.90 cm/m  RA Area:     31.80 cm LA Vol (A2C):   89.2 ml 38.59 ml/m RA Volume:   121.00 ml 52.35 ml/m LA Vol (A4C):   88.9 ml 38.46 ml/m LA Biplane Vol: 94.6 ml 40.92 ml/m  AORTIC VALVE LVOT Vmax:   68.50 cm/s LVOT Vmean:  42.600 cm/s LVOT VTI:    0.129 m  AORTA Ao Root diam: 3.20 cm Ao Asc diam:  3.80 cm MITRAL VALVE               TRICUSPID VALVE MV Area (PHT): 4.26 cm    TR Peak grad:   13.8 mmHg MV Decel Time: 178 msec    TR Vmax:        186.00 cm/s MV E velocity: 92.10 cm/s MV A velocity: 85.30 cm/s  SHUNTS MV E/A ratio:  1.08        Systemic VTI:  0.13 m                            Systemic Diam: 2.20 cm Lyman Bishop MD Electronically signed by Lyman Bishop MD Signature Date/Time: 09/28/2020/4:15:22 PM    Final     Cardiac Studies   Echocardiogram 09/01/2020 -Image  quality is suboptimal despite use of Definity -Best preserved wall motion is in the lateral wall, suggesting EF may be approximately 20%. -RV function is mildly reduced and RV appears mildly enlarged. -Atria are poorly visualized for measurement. -At least moderate tricuspid valve regurgitation -Mitral valve poorly visualized.  Patient Profile     74 y.o. 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  History of inferior STEMI in 2011 with intervention on a totally occluded RCA and staged PCI of the mid LAD.  Subsequent CABG, and 2018 had subsequent MI and underwent redo CABG.  Developed shock  requiring ECMO, subsequently required dialysis.  Admitted for accidental Dilaudid overdose, cardiology involved due to episode of sustained ventricular tachycardia requiring CPR and epinephrine. Assessment & Plan    1.  Ventricular tachycardia 2.  Ischemic cardiomyopathy 3.  CAD status post CABG 2017, redo CABG 2018 -Witnessed VT arrest in the emergency room requiring CPR and single dose of epinephrine.  Electrolytes were normal, no ischemia on last nuclear stress test December 2020.  EF felt to lower than prior, 25% at the [facility], approximately 20% obtained yesterday. -Would be best to perform ischemic evaluation given concern for worsening ejection fraction and ventricular tachycardia.  I have discussed this with Dr. Haroldine Laws who has seen the patient previously, we will plan for right and left heart cath with femoral access tomorrow.  He would like to discuss this with his wife, but he was agreeable to proceeding. -Continue amiodarone IV -Patient has hypotension, midodrine is being used, can continue.  4.  Accidental overdose on Dilaudid -Patient is mentating clearly today. 5.  ESRD on HD -Plan is for dialysis tomorrow, will coordinate with cath.   For questions or updates, please contact [place] Please consult [email or web address] for contact info under        Signed, Elouise Munroe, MD  09/28/2020, 9:30 AM    Discussed with patient and wife at bedside, will plan for [facility] tomorrow to coordinate with dialysis. To be scheduled with Dr. Haroldine Laws.  INFORMED CONSENT: I have reviewed the risks, indications, and alternatives to cardiac catheterization, possible angioplasty, and stenting with the patient. Risks include but are not limited to bleeding, infection, vascular injury, stroke, myocardial infection, arrhythmia, kidney injury, radiation-related injury in the case of prolonged fluoroscopy use, emergency cardiac surgery, and death. The patient understands the risks of serious  complication is 1-2 in 1224 with diagnostic cardiac cath and 1-2% or less with angioplasty/stenting.     CRITICAL CARE Performed by: Cherlynn Kaiser, MD   Total critical care time: 35 minutes   Critical care time was exclusive of separately billable procedures and treating other patients.   Critical care was necessary to treat or prevent imminent or life-threatening deterioration.   Critical care was time spent personally by me (independent of APPs or residents) on the following activities: development of treatment plan with patient and/or surrogate as well as nursing, discussions with consultants, evaluation of patient's response to treatment, examination of patient, obtaining history from patient or surrogate, ordering and performing treatments and interventions, ordering and review of laboratory studies, ordering and review of radiographic studies, pulse oximetry and re-evaluation of patient's condition.

## 2020-09-28 NOTE — Evaluation (Signed)
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 In the ER, he had an episode of VT and required 2 min CPR with return of ROSC after 1 epi. Admitted with acute metabolic encephalopathy after VT arrest with ischemic cardiomyopathy.    Clinical Impression   PTA, pt was living with his wife and was performing BADLs; pt reports that he has not been using his darco shoe at home. Pt currently requiring Min A for UB ADLs, Max A for LB ADLs, and Min A +2 for functional mobility using RW. Pt presenting with poor awareness of precautions and safety, decreased balance, weakness, and poor activity tolerance. Pt would benefit from further acute OT to facilitate safe dc. Recommend dc to home with HHOT for further OT to optimize safety, independence with ADLs, and return to PLOF.     Follow Up Recommendations  Home health OT;Supervision/Assistance - 24 hour    Equipment Recommendations  None recommended by OT    Recommendations for Other Services PT consult     Precautions / Restrictions Precautions Precautions: Fall Precaution Comments: LLE wound vac Required Braces or Orthoses: Other Brace Other Brace: DARCO shoe--L (pt reports not using due to balance issues, wife to bring ) Restrictions Weight Bearing Restrictions: Yes LLE Weight Bearing: Partial weight bearing LLE Partial Weight Bearing Percentage or Pounds: only place weight on L heel       Mobility Bed Mobility Overal bed mobility: Needs Assistance Bed Mobility: Supine to Sit     Supine to sit: Min assist;+2 for physical assistance;+2 for safety/equipment     General  bed mobility comments: min A for bringing trunk into upright and pad scoot of hips to EoB    Transfers Overall transfer level: Needs assistance Equipment used: Rolling walker (2 wheeled) Transfers: Sit to/from Stand Sit to Stand: Min assist;+2 physical assistance         General transfer comment: minAx2 for steadying, with power up maximal vc for hand placement and keeping weightbearing through L heel from bed and from recliner    Balance Overall balance assessment: Needs assistance Sitting-balance support: No upper extremity supported;Feet supported Sitting balance-Leahy Scale: Fair     Standing balance support: Bilateral upper extremity supported Standing balance-Leahy Scale: Poor Standing balance comment: requires B UE support due to WB status                           ADL either performed or assessed with clinical judgement   ADL Overall ADL's : Needs assistance/impaired Eating/Feeding: Set up;Sitting   Grooming: Supervision/safety;Set up;Sitting   Upper Body Bathing: Minimal assistance;Sitting   Lower Body Bathing: Maximal assistance;Sit to/from stand   Upper Body Dressing : Minimal assistance;Sitting   Lower Body Dressing: Maximal assistance;Sit to/from stand   Toilet Transfer: Minimal assistance;+2 for physical assistance;Stand-pivot;RW Toilet Transfer Details (indicate cue type and reason): Min A +2 to power up into standing; Max cues for weight bearing through heel only         Functional mobility during ADLs: Minimal assistance;+2 for physical assistance;+2 for safety/equipment;Rolling walker General ADL Comments: Pt presenting with decreased awareness, balance, strength, and activity  tolerance.     Vision         Perception     Praxis      Pertinent Vitals/Pain Pain Assessment: No/denies pain     Hand Dominance Right   Extremity/Trunk Assessment Upper Extremity Assessment Upper Extremity Assessment: Generalized weakness   Lower  Extremity Assessment Lower Extremity Assessment: Defer to PT evaluation       Communication Communication Communication: No difficulties   Cognition Arousal/Alertness: Awake/alert Behavior During Therapy: WFL for tasks assessed/performed Overall Cognitive Status: Impaired/Different from baseline Area of Impairment: Memory;Attention;Following commands;Safety/judgement;Awareness;Problem solving                   Current Attention Level: Sustained Memory: Decreased short-term memory Following Commands: Follows one step commands inconsistently;Follows one step commands with increased time Safety/Judgement: Decreased awareness of safety;Decreased awareness of deficits Awareness: Intellectual Problem Solving: Slow processing;Difficulty sequencing;Requires verbal cues General Comments: Pt with poor awareness of weight bearing and impacting on the healing of his foot.    General Comments  VSS on 2L O2 via [place]. Wife present throughout    Exercises     Shoulder Instructions      Home Living Family/patient expects to be discharged to:: Private residence Living Arrangements: Alone Available Help at Discharge: Family;Available 24 hours/day Type of Home: House Home Access: Stairs to enter [employer] of Steps: 4 - front Entrance Stairs-Rails: None Home Layout: One level     Bathroom Shower/Tub: Teacher, early years/pre: Standard     Home Equipment: Environmental consultant - 4 wheels;Walker - 2 wheels;Bedside commode;Shower seat;Wheelchair - manual;Hand held shower head;Adaptive equipment Adaptive Equipment: Reacher        Prior Functioning/Environment Level of Independence: Independent with assistive device(s)        Comments: walking with either rollator, assisted for all ADL since toe amputation        OT Problem List: Decreased activity tolerance;Impaired balance (sitting and/or standing);Decreased strength;Decreased knowledge of use of DME or  AE;Pain;Obesity      OT Treatment/Interventions: Self-care/ADL training;DME and/or AE instruction;Therapeutic activities;Patient/family education;Balance training    OT Goals(Current goals can be found in the care plan section) Acute Rehab OT Goals Patient Stated Goal: to get home OT Goal Formulation: With patient Time For Goal Achievement: 10/12/20 Potential to Achieve Goals: Good  OT Frequency: Min 2X/week   Barriers to D/C:            Co-evaluation PT/OT/SLP Co-Evaluation/Treatment: Yes Reason for Co-Treatment: For patient/therapist safety;To address functional/ADL transfers PT goals addressed during session: Mobility/safety with mobility OT goals addressed during session: ADL's and self-care      AM-PAC OT "6 Clicks" Daily Activity     Outcome Measure Help from another person eating meals?: None Help from another person taking care of personal grooming?: A Little Help from another person toileting, which includes using toliet, bedpan, or urinal?: A Little Help from another person bathing (including washing, rinsing, drying)?: A Lot Help from another person to put on and taking off regular upper body clothing?: A Little Help from another person to put on and taking off regular lower body clothing?: A Lot 6 Click Score: 17   End of Session Equipment Utilized During Treatment: Oxygen (2L) Nurse Communication: Mobility status  Activity Tolerance: Patient tolerated treatment well Patient left: with call bell/phone within reach;in chair;with chair alarm set;with family/visitor present  OT Visit Diagnosis: Unsteadiness on feet (R26.81);Other abnormalities of gait and mobility (R26.89);Pain;Other symptoms and signs involving cognitive function  Time: 6389-3734 OT Time Calculation (min): 32 min Charges:  OT General Charges $OT Visit: 1 Visit OT Evaluation $OT Eval Moderate Complexity: [place], OTR/L Acute Rehab Pager:  [phone] Office: [place] 09/28/2020, 2:25 PM

## 2020-09-28 NOTE — Progress Notes (Addendum)
[facility] Progress Note   Subjective: Seen in CCU 1. Says he feels better. Attempted to eat and vomited all food. He denies chest pain but did use his CPAP earlier because he felt some SOB.   Objective Vitals:   09/28/20 1300 09/28/20 1400 09/28/20 1500 09/28/20 1505  BP: (!) 82/69  97/71   Pulse: 72 73 70   Resp: 18 18 16    Temp:    97.8 F (36.6 C)  TempSrc:    Oral  SpO2: 100% 99% 100%   Weight:      Height:       Physical Exam General: Chronically ill appearing male in NAD Heart: HS distant, S1,S2 RRR No M. 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  Lungs: CTAB. No WOB.  Abdomen: Active BS Extremities: Trace BLE edema.  Dialysis Access: L AVF + bruit   Additional Objective Labs: Basic Metabolic Panel: Recent Labs  Lab 09/24/20 0248 09/25/20 0353 09/17/2020 0356 09/01/2020 0356 09/24/2020 0923 09/23/2020 1649 09/28/20 0042  NA 136   < > 136   < > 133* 136 136  K 3.9   < > 4.2   < > 5.4* 4.4 4.8  CL 95*   < > 90*  --   --  91* 91*  CO2 25   < > 26  --   --  24 24  GLUCOSE 124*   < > 190*  --   --  290* 263*  BUN 18   < > 16  --   --  19 22  CREATININE 5.89*   < > 6.40*  --   --  7.07* 7.41*  CALCIUM 9.0   < > 10.0  --   --  10.1 10.1  PHOS 4.1  --   --   --   --  6.1*  --    < > = values in this interval not displayed.   Liver Function Tests: Recent Labs  Lab 09/23/20 1119 09/23/20 1119 09/24/20 0248 09/09/2020 1649 09/28/20 0042  AST 58*  --   --  74* 71*  ALT 25  --   --  45* 50*  ALKPHOS 164*  --   --  158* 145*  BILITOT 1.3*  --   --  1.5* 1.3*  PROT 7.0  --   --  7.6 7.1  ALBUMIN 3.1*   < > 2.8* 3.3* 3.2*   < > = values in this interval not displayed.   No results for input(s): LIPASE, AMYLASE in the last 168 hours. CBC: Recent Labs  Lab 09/23/20 1119 09/23/20 1119 09/24/20 0248 09/24/20 0248 09/25/20 0353 09/25/20 0353 09/26/2020 0356 09/09/2020 0923 09/28/20 0042  WBC 11.6*   < > 8.7   < > 9.5  --  13.1*  --  18.9*  NEUTROABS  --   --   6.3  --   --   --  10.0*  --   --   HGB 10.4*   < > 10.0*   < > 11.0*   < > 10.9* 12.2* 10.5*  HCT 31.6*   < > 29.7*   < > 33.1*   < > 33.7* 36.0* 32.0*  MCV 94.9  --  92.5  --  94.0  --  96.8  --  95.8  PLT 249   < > 225   < > 249  --  302  --  301   < > = values in this interval not displayed.  Blood Culture    Component Value Date/Time   SDES URINE, CATHETERIZED 01/16/2020 1329   SPECREQUEST NONE 01/16/2020 1329   CULT  01/16/2020 1329    NO GROWTH Performed at [facility], [place] [address] [place], [place] [address]    REPTSTATUS 01/17/2020 FINAL 01/16/2020 1329    Cardiac Enzymes: No results for input(s): CKTOTAL, CKMB, CKMBINDEX, TROPONINI in the last 168 hours. CBG: Recent Labs  Lab 09/08/2020 1754 09/10/2020 2132 09/28/20 0618 09/28/20 1111 09/28/20 1503  GLUCAP 282* 259* 237* 308* 276*   Iron Studies: No results for input(s): IRON, TIBC, TRANSFERRIN, FERRITIN in the last 72 hours. @lablastinr3 @ Studies/Results: DG Chest Port 1 View  Result Date: 09/26/2020 CLINICAL DATA:  Shortness of breath EXAM: PORTABLE CHEST 1 VIEW COMPARISON:  09/13/2020 FINDINGS: Postoperative changes in the mediastinum. Cardiac enlargement. Improving vascular congestion. Atelectasis in the left lung base with possible small left pleural effusion. No progression. IMPRESSION: Cardiac enlargement with improving vascular congestion. Atelectasis in the left base with possible small left pleural effusion, unchanged. Electronically Signed   By: Lucienne Capers M.D.   On: 09/15/2020 06:34   DG Chest Port 1 View  Result Date: 09/26/2020 CLINICAL DATA:  Shortness of breath.  Drug overdose. EXAM: PORTABLE CHEST 1 VIEW COMPARISON:  09/23/2020 FINDINGS: Postoperative changes in the mediastinum. Diffuse cardiac enlargement. Mild vascular congestion. No edema or consolidation. Suggestion of atelectasis in the left lung base. Possible pleural effusion or thickening in the costophrenic angles. IMPRESSION:  Cardiac enlargement with mild vascular congestion. No edema or consolidation. Atelectasis in the left lung base. Possible small pleural effusions or thickening in the costophrenic angles. Electronically Signed   By: Lucienne Capers M.D.   On: 09/17/2020 04:14   ECHOCARDIOGRAM COMPLETE  Result Date: 09/10/2020    ECHOCARDIOGRAM REPORT   Patient Name:   WORLEY RADERMACHER Date of Exam: 09/13/2020 Medical Rec #:  [id number]       Height:       66.5 in Accession #:    [id number]      Weight:       277.6 lb Date of Birth:  1946/02/26        BSA:          2.312 m Patient Age:    74 years        BP:           100/62 mmHg Patient Gender: M               HR:           74 bpm. Exam Location:  Inpatient Procedure: 2D Echo, Cardiac Doppler, Color Doppler and Intracardiac            Opacification Agent Indications:    R06.02 SOB  History:        Patient has prior history of Echocardiogram examinations, most                 recent 11/16/2009. Arrythmias:Cardiac Arrest;                 Signs/Symptoms:Hypotension.  Sonographer:    Merrie Roof RDCS Referring Phys: [id number] Raynaldo Opitz RAINES IMPRESSIONS  1. No mural thrombus with Definity contrast. Left ventricular ejection fraction, by estimation, is <20%. The left ventricle has severely decreased function. The left ventricle demonstrates global hypokinesis. The left ventricular internal cavity size was moderately dilated. Left ventricular diastolic parameters are consistent with Grade II diastolic dysfunction (pseudonormalization). Elevated left ventricular end-diastolic pressure.  2.  Right ventricular systolic function is mildly reduced. The right ventricular size is normal. There is normal pulmonary artery systolic pressure.  3. Left atrial size was moderately dilated.  4. Right atrial size was severely dilated.  5. ? may be some degree of MS - aliasing noted during diastole in the LV suggesting restricted diastolic filling. The mitral valve was not well visualized. No evidence  of mitral valve regurgitation. Moderate mitral annular calcification.  6. The aortic valve is tricuspid. Aortic valve regurgitation is not visualized.  7. Aortic dilatation noted. There is borderline dilatation of the ascending aorta, measuring 38 mm.  8. The inferior vena cava is dilated in size with <50% respiratory variability, suggesting right atrial pressure of 15 mmHg. FINDINGS  Left Ventricle: No mural thrombus with Definity contrast. Left ventricular ejection fraction, by estimation, is <20%. The left ventricle has severely decreased function. The left ventricle demonstrates global hypokinesis. Definity contrast agent was given IV to delineate the left ventricular endocardial borders. The left ventricular internal cavity size was moderately dilated. There is no left ventricular hypertrophy. Left ventricular diastolic parameters are consistent with Grade II diastolic dysfunction (pseudonormalization). Elevated left ventricular end-diastolic pressure. Right Ventricle: The right ventricular size is normal. No increase in right ventricular wall thickness. Right ventricular systolic function is mildly reduced. There is normal pulmonary artery systolic pressure. The tricuspid regurgitant velocity is 1.86 m/s, and with an assumed right atrial pressure of 15 mmHg, the estimated right ventricular systolic pressure is 73.2 mmHg. Left Atrium: Left atrial size was moderately dilated. Right Atrium: Right atrial size was severely dilated. Pericardium: There is no evidence of pericardial effusion. Mitral Valve: ? may be some degree of MS - aliasing noted during diastole in the LV suggesting restricted diastolic filling. The mitral valve was not well visualized. Moderate mitral annular calcification. No evidence of mitral valve regurgitation. Tricuspid Valve: The tricuspid valve is grossly normal. Tricuspid valve regurgitation is mild. Aortic Valve: The aortic valve is tricuspid. Aortic valve regurgitation is not  visualized. Pulmonic Valve: The pulmonic valve was grossly normal. Pulmonic valve regurgitation is trivial. Aorta: Aortic dilatation noted. There is borderline dilatation of the ascending aorta, measuring 38 mm. Venous: The inferior vena cava is dilated in size with less than 50% respiratory variability, suggesting right atrial pressure of 15 mmHg. IAS/Shunts: The interatrial septum was not well visualized.  LEFT VENTRICLE PLAX 2D LVIDd:         6.20 cm      Diastology LVIDs:         5.50 cm      LV e' medial:    3.70 cm/s LV PW:         0.90 cm      LV E/e' medial:  24.9 LV IVS:        0.90 cm      LV e' lateral:   7.62 cm/s LVOT diam:     2.20 cm      LV E/e' lateral: 12.1 LV SV:         49 LV SV Index:   21 LVOT Area:     3.80 cm  LV Volumes (MOD) LV vol d, MOD A2C: 146.0 ml LV vol d, MOD A4C: 177.0 ml LV vol s, MOD A2C: 90.0 ml LV vol s, MOD A4C: 148.0 ml LV SV MOD A2C:     56.0 ml LV SV MOD A4C:     177.0 ml LV SV MOD BP:      46.2 ml RIGHT  VENTRICLE          IVC RV Basal diam:  4.30 cm  IVC diam: 2.20 cm RV Mid diam:    4.10 cm TAPSE (M-mode): 1.3 cm LEFT ATRIUM             Index       RIGHT ATRIUM           Index LA diam:        4.40 cm 1.90 cm/m  RA Area:     31.80 cm LA Vol (A2C):   89.2 ml 38.59 ml/m RA Volume:   121.00 ml 52.35 ml/m LA Vol (A4C):   88.9 ml 38.46 ml/m LA Biplane Vol: 94.6 ml 40.92 ml/m  AORTIC VALVE LVOT Vmax:   68.50 cm/s LVOT Vmean:  42.600 cm/s LVOT VTI:    0.129 m  AORTA Ao Root diam: 3.20 cm Ao Asc diam:  3.80 cm MITRAL VALVE               TRICUSPID VALVE MV Area (PHT): 4.26 cm    TR Peak grad:   13.8 mmHg MV Decel Time: 178 msec    TR Vmax:        186.00 cm/s MV E velocity: 92.10 cm/s MV A velocity: 85.30 cm/s  SHUNTS MV E/A ratio:  1.08        Systemic VTI:  0.13 m                            Systemic Diam: 2.20 cm Lyman Bishop MD Electronically signed by Lyman Bishop MD Signature Date/Time: 09/14/2020/4:15:22 PM    Final    Medications: . amiodarone 30 mg/hr (09/28/20  1515)   . ammonium lactate  1 application Topical Daily  . amoxicillin-clavulanate  1 tablet Oral Q12H  . aspirin  81 mg Oral Daily  . atorvastatin  40 mg Oral QHS  . chlorhexidine  15 mL Mouth Rinse BID  . Chlorhexidine Gluconate Cloth  6 each Topical Daily  . cinacalcet  30 mg Oral Q supper  . clopidogrel  75 mg Oral Daily  . enoxaparin (LOVENOX) injection  30 mg Subcutaneous Q24H  . fluticasone furoate-vilanterol  1 puff Inhalation Daily  . insulin aspart  0-9 Units Subcutaneous Q4H  . insulin glargine  25 Units Subcutaneous QHS  . lamoTRIgine  150 mg Oral Daily  . mouth rinse  15 mL Mouth Rinse q12n4p  . midodrine  10 mg Oral TID WC  . sevelamer carbonate  1,600 mg Oral TID WC  . sodium chloride flush  3 mL Intravenous Q12H  . sodium zirconium cyclosilicate  10 g Oral Once     Dialysis Orders:  TTS at [facility] -- full HD yesterday 11/27 4:15hr, 450/800, EDW 116kg, 2K/2Ca, AVF,  -heparin 8000 units IV bolus TIW -Calcitriol 2.20mcg PO q HD -No ESA  Assessment/Plan: 1.  VT arrest with quick ROSC: Hx Short run NSVT during last admit. Cardiology following, on amiodarone IV at 30 mg/hr.  2.  Ischemic CM: Repeat ECHO 11/28 estimates EF < 20%. LV with global hypokinesis, G2DD. Going for L & R Heart cath in AM. Schedule HD around heart cath.   3. H/O prior CABG and Redo CABG (2017, 2018). Cardiology following.  4. ESRD: Usual TTS schedule. HD tomorrow on schedule, do as separate. Will have to schedule around heart cath.  5.  Hypotension/volume: BP low sided, takes midodrine pre-HD now. BB held last admit, on  IV amio now. Midodrine increased to 10 mg PO TID.Volume is up 5 liters. Concerning with soft BP. UF as tolerated tomorrow. May require serial HD for volume removal or CRRT.  6.  Anemia: Hgb 10.5 - no ESA needed. 7.  Metabolic bone disease: Ca 10, Phos ok - hold PO VDRA for now. 8.  Nutrition: Albumin on low side, PO intake impeded by nausea. On renal diet fld restrictions.  9. DM-  Per primary 10. Accidental OD on Dilaudid-Patient is alert & oriented. Per Primary.   Taos Tapp H. Valerye Kobus NP-C 09/28/2020, 5:37 PM  [employer] [phone]

## 2020-09-28 NOTE — Progress Notes (Signed)
Pt took CPAP off and stated "I am done with this for a while". RN educated pt on needs of CPAP and will monitor the patient Respiratory status and implement as needed.

## 2020-09-28 NOTE — Progress Notes (Signed)
   09/28/20 1600  Clinical Encounter Type  Visited With Patient;Family  Visit Type Initial  Referral From Social work  Consult/Referral To Chaplain  Spiritual Encounters  Spiritual Needs Literature  The chaplain spoke with the patient and wife at bedside. They will complete the AD paperwork and the chaplain will arrange for notary and volunteers' tomorrow morning.  The patient and his wife are aware that the paperwork may not get signed if the patient has been administered medication in the morning. The chaplain also provided advance directive education and the wife is aware she is the next of kin and functioning POA in the absence of the completed AD.

## 2020-09-28 NOTE — Consult Note (Addendum)
NAME:  Connor Hoover, MRN:  [id number], DOB:  08/22/1946, LOS: 1 ADMISSION DATE:  09/10/2020, CONSULTATION DATE:  11/28 REFERRING MD:  Dr. Christy Gentles, CHIEF COMPLAINT:  Cardiac Arrest    Brief History   74 y/o M who presented for AMS 11/28 after dosing of dilaudid for dressing change. Was admitted for evaluation by IMTS.  Developed 2 min VT arrest in ER with ROSC and return of mental status.   History of present illness   74 y/o M who presented to [facility] on 11/28 for AMS.   The patient is a poor historian but his wife provides detailed information.  He is followed at the [facility].  The patient developed what he thought was an in-grown toenail and it became infected requiring admission from 09/16/20 through 11/21 at the [place].  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 He was also identified to have worsening LVEF on ECHO, PAD on LLE requiring angioplasty.  He fell prior to leaving the hospital and had difficulty getting into the car for discharge - he required 3 people to get into the car. At discharge, he was started on lopressor, plavix and midodrine.  After returning home, he continued to have weakness, nausea and vomiting.  He was readmitted to [facility] from 11/24 - 11/26 for acute on chronic systolic CHF, weakness. Lopressor was held and he was noted to have an episode of asymptomatic VT on 11/25.  He is normally a T/Th/S HD patient and was changed to Monday for the week of Thanksgiving for the holiday. He was unable to make it to HD on Monday due to fatigue. He was discharged off his lopressor due to soft BP's.   Wife reports am of admit he woke and was short of breath.  She tried oxygen and CPAP but they did not relieve his dyspnea.  She reports he "passed out" and EMS was called. He became responsive during the 911 call and EMS transported him to the ER.  In the ER his labs were relatively normal.  Wife notes he had dilaudid on 11/27 at 4pm for a wound VAC  change.  In the ER, he had an episode of VT and required 2 min CPR with return of ROSC after 1 epi and normal mental status.  He was placed on BiPAP after for comfort.   PCCM called for evaluation.   Past Medical History  DM II  ESRD  Chronic Combined CHF / HFrEF  CAD - Inferior STEMI 10/2009 s/p PCI of RCA, s/p CABG PAD  Osteomyelitis - left great toe s/p amputation at [facility], d/c'd to SNF on 11/21.    Significant Hospital Events   11/28 Admit, 2 min VT arrest  Consults:  Cardiology   Procedures:    Significant Diagnostic Tests:  11/28 CXR>>Cardiac enlargement with improving vascular congestion. Atelectasis in the left base with possible small left pleural effusion, unchanged.  11/28 Echo>>No mural thrombus with Definity contrast. Left ventricular ejection fraction, by estimation, is <20%. The left ventricle has severely decreased function. The left ventricle demonstrates global hypokinesis. The left ventricular internal cavity size was moderately dilated. Left ventricular diastolic parameters are consistent with Grade II diastolic dysfunction (pseudonormalization). Elevated left ventricular end-diastolic pressure.   Micro Data:  COVID 11/28 >> negative  Influenza A/B 11/28 >> negative   Antimicrobials:  Augmentin 11/29-  Interim history/subjective:  Pt awake and conversational, has no complaints, doing well on 3L   Objective  Blood pressure 112/81, pulse 72, temperature 98 F (36.7 C), temperature source Axillary, resp. rate 16, height 5' 6.5" (1.689 m), weight 121 kg, SpO2 100 %.    FiO2 (%):  [40 %] 40 %   Intake/Output Summary (Last 24 hours) at 09/28/2020 0802 Last data filed at 09/28/2020 0600 Gross per 24 hour  Intake 1169.5 ml  Output 0 ml  Net 1169.5 ml   Filed Weights   09/24/2020 0343 09/24/2020 1545 09/28/20 0500  Weight: 125.9 kg 119.6 kg 121 kg    General:  Elderly M, awake, responsive and no distress HEENT: MM pink/moist, nasal cannula  Neuro:  oriented to person and situation, following commands, moving all extremities CV: s1s2 rrr, no m/r/g PULM:  Clear bilaterally with minimally reduced breath sounds bilateral bases  GI: soft, bsx4 active  Extremities: warm/dry, no edema. L toe amputation site bandaged without obvious bleeding  Skin: no rashes or lesions   Resolved Hospital Problem list     Assessment & Plan:   Witnessed VT Arrest with ischemic cardiomyopathy, CAD s/p CABG 2017 and redo 2018  2 min CPR in ER, s/p epi x1 P: -trop peaked at 73, EF worsening compared to recent s/u at [facility], cardiology following and plan for ischemic w/u with L and R heart cath -Continue Amiodarone, Plavix, Lipitor  -Follow electrolytes -Can likely transfer out of intensive care if stable after heart catheterization    Acute Metabolic Encephalopathy / Accidental Overdose  In setting of narcotic administration for recent amputation  P: -resolving, mental status improved today -hold narcotics -supportive care -would use non-narcotic agent if possible for dressing changes   -continue Neuro monitoring    Chronic Hypotension  BP stable and not requiring pressors P: -continue Midodrine  ESRD, hyperkalemia Developed shock after his CABG requiring ECMO and dialysis, TTS schedule  P: -Nephrology following, K improved after [place], dialysis 11/29 or 11/30 per nephrology recs -trend electrolytes  COPD without Exacerbation  OSA  Has home CPAP, does not wear  P: -CPAP QHS    -continue Breo -WBC up-trending, start Augmentin for possible aspiration in the setting of chest compressions   DM II  Glucose >200 over last 24 hrs P: -change SSI from very sensitive scale to sensitive scale -may need to resume Lantus  L Great Toe Amputation  -WOC consult for wound care     Leukocytosis Pt is afebrile, possibly reactive, no clear source of infection P: -Monitor, work-up if not improving   Frequent Falls, Deconditioning  -PT  consult -suspect he will need short term rehab before returning home based on recent hx of falls, multiple admits  Best practice (evaluated daily)  Diet: As tolerted  Pain/Anxiety/Delirium protocol (if indicated): n/a  VAP protocol (if indicated): n/a  DVT prophylaxis: Lovenox GI prophylaxis: per primary  Glucose control: SSI Mobility: As tolerated  last date of multidisciplinary goals of care discussion: per primary  Family and staff present: pending  Summary of discussion: pending Follow up goals of care discussion due: 12/5  Code Status: Full Code  Disposition: ICU  Labs   CBC: Recent Labs  Lab 09/23/20 1119 09/23/20 1119 09/24/20 0248 09/25/20 0353 09/17/2020 0356 09/28/2020 0923 09/28/20 0042  WBC 11.6*  --  8.7 9.5 13.1*  --  18.9*  NEUTROABS  --   --  6.3  --  10.0*  --   --   HGB 10.4*   < > 10.0* 11.0* 10.9* 12.2* 10.5*  HCT 31.6*   < > 29.7* 33.1*  33.7* 36.0* 32.0*  MCV 94.9  --  92.5 94.0 96.8  --  95.8  PLT 249  --  225 249 302  --  301   < > = values in this interval not displayed.    Basic Metabolic Panel: Recent Labs  Lab 09/24/20 0248 09/24/20 0248 09/25/20 0353 09/25/20 1038 09/20/2020 0356 09/26/2020 0916 09/11/2020 0923 09/22/2020 1649 09/28/20 0042  NA 136   < > 135  --  136  --  133* 136 136  K 3.9   < > 4.5  --  4.2  --  5.4* 4.4 4.8  CL 95*  --  92*  --  90*  --   --  91* 91*  CO2 25  --  26  --  26  --   --  24 24  GLUCOSE 124*  --  123*  --  190*  --   --  290* 263*  BUN 18  --  27*  --  16  --   --  19 22  CREATININE 5.89*  --  7.66*  --  6.40*  --   --  7.07* 7.41*  CALCIUM 9.0  --  9.9  --  10.0  --   --  10.1 10.1  MG  --   --   --  2.5*  --  2.5*  --  2.5* 2.6*  PHOS 4.1  --   --   --   --   --   --  6.1*  --    < > = values in this interval not displayed.   GFR: Estimated Creatinine Clearance: 11.1 mL/min (A) (by C-G formula based on SCr of 7.41 mg/dL (H)). Recent Labs  Lab 09/24/20 0248 09/25/20 0353 09/10/2020 0356 09/28/20 0042   WBC 8.7 9.5 13.1* 18.9*    Liver Function Tests: Recent Labs  Lab 09/23/20 1119 09/24/20 0248 09/09/2020 1649 09/28/20 0042  AST 58*  --  74* 71*  ALT 25  --  45* 50*  ALKPHOS 164*  --  158* 145*  BILITOT 1.3*  --  1.5* 1.3*  PROT 7.0  --  7.6 7.1  ALBUMIN 3.1* 2.8* 3.3* 3.2*   No results for input(s): LIPASE, AMYLASE in the last 168 hours. No results for input(s): AMMONIA in the last 168 hours.  ABG    Component Value Date/Time   HCO3 28.1 (H) 09/18/2020 0923   TCO2 30 09/21/2020 0923   O2SAT 76.0 09/08/2020 0923     Coagulation Profile: No results for input(s): INR, PROTIME in the last 168 hours.  Cardiac Enzymes: No results for input(s): CKTOTAL, CKMB, CKMBINDEX, TROPONINI in the last 168 hours.  HbA1C: Hgb A1c MFr Bld  Date/Time Value Ref Range Status  09/23/2020 11:19 AM 7.2 (H) 4.8 - 5.6 % Final    Comment:    (NOTE) Pre diabetes:          5.7%-6.4%  Diabetes:              >6.4%  Glycemic control for   <7.0% adults with diabetes     CBG: Recent Labs  Lab 09/25/20 1718 09/24/2020 1056 09/23/2020 1754 09/07/2020 2132 09/28/20 0618  GLUCAP 106* 277* 282* 259* 237*    Review of Systems:   Gen: Denies fever, chills, weight change, fatigue, night sweats HEENT: Denies blurred vision, double vision, hearing loss, tinnitus, sinus congestion, rhinorrhea, sore throat, neck stiffness, dysphagia PULM: Denies shortness of breath, cough, sputum production, hemoptysis, wheezing CV:  Denies chest pain, edema, orthopnea, paroxysmal nocturnal dyspnea, palpitations GI: Denies abdominal pain, nausea, vomiting, diarrhea, hematochezia, melena, constipation, change in bowel habits GU: Denies dysuria, hematuria, polyuria, oliguria, urethral discharge Endocrine: Denies hot or cold intolerance, polyuria, polyphagia or appetite change Derm: Denies rash, dry skin, scaling or peeling skin change Heme: Denies easy bruising, bleeding, bleeding gums Neuro: Denies headache,  numbness, weakness, slurred speech, loss of memory or consciousness MSK: LLE amputation pain  Past Medical History  He,  has a past medical history of Cardiomyopathy ([place]), Diabetes mellitus without complication ([place]), ESRD (end stage renal disease) ([place]), Hypertension, Osteomyelitis ([place]), and PTSD (post-traumatic stress disorder).   Surgical History    Past Surgical History:  Procedure Laterality Date  . AMPUTATION     Great toe amputation.    Marland Kitchen CARDIAC SURGERY     CABG and redo     Social History   reports that he has quit smoking. He has quit using smokeless tobacco. He reports previous alcohol use. He reports previous drug use.   Family History   His family history is not on file.   Allergies Allergies  Allergen Reactions  . Lisinopril Swelling    Made the tongue swell and resulted in a trip to the ER  . Povidine [Povidone Iodine] Itching     Home Medications  Prior to Admission medications   Medication Sig Start Date End Date Taking? Authorizing Provider  acetaminophen (TYLENOL) 325 MG tablet Take 650 mg by mouth every 6 (six) hours as needed for mild pain or fever.    [provider]  albuterol (VENTOLIN HFA) 108 (90 Base) MCG/ACT inhaler Inhale 2 puffs into the lungs every 6 (six) hours as needed for wheezing or shortness of breath.    [provider]  ammonium lactate (LAC-HYDRIN) 12 % lotion Apply 1 application topically daily.    [provider]  aspirin 81 MG chewable tablet Chew 81 mg by mouth daily.    [provider]  atorvastatin (LIPITOR) 80 MG tablet Take 40 mg by mouth at bedtime.     [provider]  B Complex-C-Folic Acid (B COMPLEX-VITAMIN C-FOLIC ACID) 1 MG tablet Take 1 tablet by mouth daily. 10/17/17   [provider]  bacitracin 500 UNIT/GM ointment Apply 1 application topically See admin instructions. Apply small amount topically every day for the first week. Only apply after cleaning and before  applying gauze dressing. Discontinue after first week.    [provider]  cinacalcet (SENSIPAR) 30 MG tablet Take 30 mg by mouth daily. With largest meal    [provider]  clopidogrel (PLAVIX) 75 MG tablet Take 75 mg by mouth daily.    [provider]  clotrimazole (LOTRIMIN) 1 % cream Apply 1 application topically daily.    [provider]  docusate sodium (COLACE) 50 MG capsule Take 50 mg by mouth 2 (two) times daily.    [provider]  Fluticasone-Salmeterol (ADVAIR) 100-50 MCG/DOSE AEPB Inhale 1 puff into the lungs 2 (two) times daily.    [provider]  hydrocerin (EUCERIN) CREA Apply 1 application topically 2 (two) times daily as needed.    [provider]  HYDROmorphone (DILAUDID) 2 MG tablet Take 2 mg by mouth every 6 (six) hours as needed for moderate pain or severe pain.    [provider]  insulin glargine (LANTUS) 100 UNIT/ML injection Inject 0.26 mLs (26 Units total) into the skin at bedtime. 09/25/20   [name], MD  insulin NPH-regular Human (70-30) 100 UNIT/ML injection Inject 4 Units into the skin in the morning, at noon, and at bedtime.    [provider]  ipratropium-albuterol (DUONEB) 0.5-2.5 (3) MG/3ML SOLN Take 3 mLs by nebulization in the morning and at bedtime.    [provider]  lamoTRIgine (LAMICTAL) 150 MG tablet Take 150 mg by mouth daily.    [provider]  lidocaine (LMX) 4 % cream Apply 1 application topically as needed. Patient taking differently: Apply 1 application topically 2 (two) times daily as needed.  09/15/19   Joy, Shawn C, PA-C  midodrine (PROAMATINE) 10 MG tablet Take 1 pill with food before dialysis on Tuesday, Thursday and Saturday. 09/25/20   [name], MD  Multiple Vitamin (RENAL MULTIVITAMIN/ZINC PO) Take 1 tablet by mouth daily with supper.    [provider]  sevelamer carbonate (RENVELA) 800 MG tablet Take 1,600 mg by mouth 3  (three) times daily with meals.    [provider]  triamcinolone (KENALOG) 0.1 % Apply 1 application topically 2 (two) times daily.    [provider]       CRITICAL CARE Performed by: Otilio Carpen Charna Neeb   Total critical care time: 35 minutes  Critical care time was exclusive of separately billable procedures and treating other patients.  Critical care was necessary to treat or prevent imminent or life-threatening deterioration.  Critical care was time spent personally by me on the following activities: development of treatment plan with patient and/or surrogate as well as nursing, discussions with consultants, evaluation of patient's response to treatment, examination of patient, obtaining history from patient or surrogate, ordering and performing treatments and interventions, ordering and review of laboratory studies, ordering and review of radiographic studies, pulse oximetry and re-evaluation of patient's condition.    Otilio Carpen Zoanne Newill, PA-C [facility]  Pager# [phone], if no answer [phone]

## 2020-09-29 ENCOUNTER — Encounter (HOSPITAL_COMMUNITY): Admission: EM | Disposition: E | Payer: Self-pay | Source: Home / Self Care | Attending: Critical Care Medicine

## 2020-09-29 ENCOUNTER — Encounter (HOSPITAL_COMMUNITY): Payer: Self-pay | Admitting: Internal Medicine

## 2020-09-29 DIAGNOSIS — I959 Hypotension, unspecified: Secondary | ICD-10-CM

## 2020-09-29 DIAGNOSIS — N186 End stage renal disease: Secondary | ICD-10-CM | POA: Diagnosis not present

## 2020-09-29 DIAGNOSIS — I255 Ischemic cardiomyopathy: Secondary | ICD-10-CM | POA: Diagnosis not present

## 2020-09-29 DIAGNOSIS — I2510 Atherosclerotic heart disease of native coronary artery without angina pectoris: Secondary | ICD-10-CM | POA: Diagnosis not present

## 2020-09-29 DIAGNOSIS — I5043 Acute on chronic combined systolic (congestive) and diastolic (congestive) heart failure: Secondary | ICD-10-CM | POA: Diagnosis not present

## 2020-09-29 DIAGNOSIS — I25119 Atherosclerotic heart disease of native coronary artery with unspecified angina pectoris: Secondary | ICD-10-CM | POA: Diagnosis not present

## 2020-09-29 DIAGNOSIS — I472 Ventricular tachycardia: Secondary | ICD-10-CM | POA: Diagnosis not present

## 2020-09-29 HISTORY — PX: RIGHT/LEFT HEART CATH AND CORONARY ANGIOGRAPHY: CATH118266

## 2020-09-29 LAB — POCT I-STAT EG7
Acid-Base Excess: 2 mmol/L (ref 0.0–2.0)
Acid-Base Excess: 4 mmol/L — ABNORMAL HIGH (ref 0.0–2.0)
Bicarbonate: 28.3 mmol/L — ABNORMAL HIGH (ref 20.0–28.0)
Bicarbonate: 30.5 mmol/L — ABNORMAL HIGH (ref 20.0–28.0)
Calcium, Ion: 1.05 mmol/L — ABNORMAL LOW (ref 1.15–1.40)
Calcium, Ion: 1.14 mmol/L — ABNORMAL LOW (ref 1.15–1.40)
HCT: 35 % — ABNORMAL LOW (ref 39.0–52.0)
HCT: 37 % — ABNORMAL LOW (ref 39.0–52.0)
Hemoglobin: 11.9 g/dL — ABNORMAL LOW (ref 13.0–17.0)
Hemoglobin: 12.6 g/dL — ABNORMAL LOW (ref 13.0–17.0)
O2 Saturation: 59 %
O2 Saturation: 60 %
Potassium: 3.9 mmol/L (ref 3.5–5.1)
Potassium: 4.1 mmol/L (ref 3.5–5.1)
Sodium: 134 mmol/L — ABNORMAL LOW (ref 135–145)
Sodium: 134 mmol/L — ABNORMAL LOW (ref 135–145)
TCO2: 30 mmol/L (ref 22–32)
TCO2: 32 mmol/L (ref 22–32)
pCO2, Ven: 52.3 mmHg (ref 44.0–60.0)
pCO2, Ven: 55.2 mmHg (ref 44.0–60.0)
pH, Ven: 7.342 (ref 7.250–7.430)
pH, Ven: 7.35 (ref 7.250–7.430)
pO2, Ven: 33 mmHg (ref 32.0–45.0)
pO2, Ven: 33 mmHg (ref 32.0–45.0)

## 2020-09-29 LAB — BASIC METABOLIC PANEL
Anion gap: 23 — ABNORMAL HIGH (ref 5–15)
BUN: 31 mg/dL — ABNORMAL HIGH (ref 8–23)
CO2: 23 mmol/L (ref 22–32)
Calcium: 10.4 mg/dL — ABNORMAL HIGH (ref 8.9–10.3)
Chloride: 88 mmol/L — ABNORMAL LOW (ref 98–111)
Creatinine, Ser: 8.6 mg/dL — ABNORMAL HIGH (ref 0.61–1.24)
GFR, Estimated: 6 mL/min — ABNORMAL LOW (ref >60)
Glucose, Bld: 197 mg/dL — ABNORMAL HIGH (ref 70–99)
Potassium: 4.5 mmol/L (ref 3.5–5.1)
Sodium: 134 mmol/L — ABNORMAL LOW (ref 135–145)

## 2020-09-29 LAB — POCT I-STAT 7, (LYTES, BLD GAS, ICA,H+H)
Acid-Base Excess: 1 mmol/L (ref 0.0–2.0)
Bicarbonate: 26.8 mmol/L (ref 20.0–28.0)
Calcium, Ion: 1.05 mmol/L — ABNORMAL LOW (ref 1.15–1.40)
HCT: 35 % — ABNORMAL LOW (ref 39.0–52.0)
Hemoglobin: 11.9 g/dL — ABNORMAL LOW (ref 13.0–17.0)
O2 Saturation: 100 %
Potassium: 3.9 mmol/L (ref 3.5–5.1)
Sodium: 136 mmol/L (ref 135–145)
TCO2: 28 mmol/L (ref 22–32)
pCO2 arterial: 44.4 mmHg (ref 32.0–48.0)
pH, Arterial: 7.389 (ref 7.350–7.450)
pO2, Arterial: 183 mmHg — ABNORMAL HIGH (ref 83.0–108.0)

## 2020-09-29 LAB — MAGNESIUM: Magnesium: 2.7 mg/dL — ABNORMAL HIGH (ref 1.7–2.4)

## 2020-09-29 LAB — RENAL FUNCTION PANEL
Albumin: 3.4 g/dL — ABNORMAL LOW (ref 3.5–5.0)
Anion gap: 23 — ABNORMAL HIGH (ref 5–15)
BUN: 34 mg/dL — ABNORMAL HIGH (ref 8–23)
CO2: 23 mmol/L (ref 22–32)
Calcium: 10.3 mg/dL (ref 8.9–10.3)
Chloride: 89 mmol/L — ABNORMAL LOW (ref 98–111)
Creatinine, Ser: 9.02 mg/dL — ABNORMAL HIGH (ref 0.61–1.24)
GFR, Estimated: 6 mL/min — ABNORMAL LOW (ref >60)
Glucose, Bld: 119 mg/dL — ABNORMAL HIGH (ref 70–99)
Phosphorus: 6.9 mg/dL — ABNORMAL HIGH (ref 2.5–4.6)
Potassium: 4.4 mmol/L (ref 3.5–5.1)
Sodium: 135 mmol/L (ref 135–145)

## 2020-09-29 LAB — CBC
HCT: 35.2 % — ABNORMAL LOW (ref 39.0–52.0)
HCT: 36.6 % — ABNORMAL LOW (ref 39.0–52.0)
Hemoglobin: 11.6 g/dL — ABNORMAL LOW (ref 13.0–17.0)
Hemoglobin: 11.9 g/dL — ABNORMAL LOW (ref 13.0–17.0)
MCH: 31.2 pg (ref 26.0–34.0)
MCH: 31.2 pg (ref 26.0–34.0)
MCHC: 33 g/dL (ref 30.0–36.0)
MCHC: 32.5 g/dL (ref 30.0–36.0)
MCV: 94.6 fL (ref 80.0–100.0)
MCV: 95.8 fL (ref 80.0–100.0)
Platelets: 296 10*3/uL (ref 150–400)
Platelets: 299 10*3/uL (ref 150–400)
RBC: 3.72 MIL/uL — ABNORMAL LOW (ref 4.22–5.81)
RBC: 3.82 MIL/uL — ABNORMAL LOW (ref 4.22–5.81)
RDW: 17.3 % — ABNORMAL HIGH (ref 11.5–15.5)
RDW: 17.7 % — ABNORMAL HIGH (ref 11.5–15.5)
WBC: 18.4 10*3/uL — ABNORMAL HIGH (ref 4.0–10.5)
WBC: 14.4 10*3/uL — ABNORMAL HIGH (ref 4.0–10.5)
nRBC: 2.3 % — ABNORMAL HIGH (ref 0.0–0.2)
nRBC: 3 % — ABNORMAL HIGH (ref 0.0–0.2)

## 2020-09-29 LAB — GLUCOSE, CAPILLARY
Glucose-Capillary: 126 mg/dL — ABNORMAL HIGH (ref 70–99)
Glucose-Capillary: 126 mg/dL — ABNORMAL HIGH (ref 70–99)
Glucose-Capillary: 128 mg/dL — ABNORMAL HIGH (ref 70–99)
Glucose-Capillary: 176 mg/dL — ABNORMAL HIGH (ref 70–99)
Glucose-Capillary: 70 mg/dL (ref 70–99)

## 2020-09-29 SURGERY — RIGHT/LEFT HEART CATH AND CORONARY ANGIOGRAPHY
Anesthesia: LOCAL

## 2020-09-29 MED ORDER — SODIUM CHLORIDE 0.9 % IV SOLN: 100.0000 mL | INTRAVENOUS | Status: DC | PRN

## 2020-09-29 MED ORDER — ALTEPLASE 2 MG IJ SOLR: 2.0000 mg | Freq: Once | INTRAMUSCULAR | Status: DC | PRN

## 2020-09-29 MED ORDER — NOREPINEPHRINE 4 MG/250ML-% IV SOLN: 0.0000 ug/min | INTRAVENOUS | Status: DC

## 2020-09-29 MED ORDER — FENTANYL CITRATE (PF) 100 MCG/2ML IJ SOLN
INTRAMUSCULAR | Status: DC | PRN
  Administered 2020-09-29: 25 ug via INTRAVENOUS

## 2020-09-29 MED ORDER — PRISMASOL BGK 4/2.5 32-4-2.5 MEQ/L REPLACEMENT SOLN: Status: DC

## 2020-09-29 MED ORDER — PRISMASOL BGK 4/2.5 32-4-2.5 MEQ/L EC SOLN: Status: DC

## 2020-09-29 MED ORDER — PENTAFLUOROPROP-TETRAFLUOROETH EX AERO: 1.0000 "application " | INHALATION_SPRAY | CUTANEOUS | Status: DC | PRN

## 2020-09-29 MED ORDER — MIDAZOLAM HCL 2 MG/2ML IJ SOLN
INTRAMUSCULAR | Status: DC | PRN
  Administered 2020-09-29: 1 mg via INTRAVENOUS

## 2020-09-29 MED ORDER — SODIUM CHLORIDE 0.9 % IV SOLN
250.0000 mL | INTRAVENOUS | Status: DC
  Administered 2020-09-29 – 2020-10-01 (×3): 250 mL via INTRAVENOUS

## 2020-09-29 MED ORDER — SODIUM CHLORIDE 0.9% FLUSH: 3.0000 mL | INTRAVENOUS | Status: DC | PRN

## 2020-09-29 MED ORDER — HEPARIN SODIUM (PORCINE) 1000 UNIT/ML DIALYSIS
1000.0000 [IU] | INTRAMUSCULAR | Status: DC | PRN
  Administered 2020-09-29: 3200 [IU] via INTRAVENOUS_CENTRAL
  Filled 2020-09-29 (×2): qty 6
  Filled 2020-09-29: qty 4

## 2020-09-29 MED ORDER — HEPARIN SODIUM (PORCINE) 1000 UNIT/ML DIALYSIS: 4000.0000 [IU] | Freq: Once | INTRAMUSCULAR | Status: DC

## 2020-09-29 MED ORDER — SODIUM CHLORIDE 0.9% FLUSH: 3.0000 mL | Freq: Two times a day (BID) | INTRAVENOUS | Status: DC

## 2020-09-29 MED ORDER — IOHEXOL 350 MG/ML SOLN
INTRAVENOUS | Status: DC | PRN
  Administered 2020-09-29: 80 mL

## 2020-09-29 MED ORDER — IOHEXOL 350 MG/ML SOLN
INTRAVENOUS | Status: AC
  Filled 2020-09-29: qty 1

## 2020-09-29 MED ORDER — HEPARIN (PORCINE) IN NACL 1000-0.9 UT/500ML-% IV SOLN
INTRAVENOUS | Status: DC | PRN
  Administered 2020-09-29 (×2): 500 mL

## 2020-09-29 MED ORDER — ASPIRIN 81 MG PO CHEW
81.0000 mg | CHEWABLE_TABLET | Freq: Every day | ORAL | Status: DC
  Administered 2020-09-30: 81 mg via ORAL
  Filled 2020-09-29: qty 1

## 2020-09-29 MED ORDER — FENTANYL CITRATE (PF) 100 MCG/2ML IJ SOLN
INTRAMUSCULAR | Status: AC
  Filled 2020-09-29: qty 2

## 2020-09-29 MED ORDER — SODIUM CHLORIDE 0.9 % IV SOLN: 250.0000 mL | INTRAVENOUS | Status: DC | PRN

## 2020-09-29 MED ORDER — LIDOCAINE-PRILOCAINE 2.5-2.5 % EX CREA
1.0000 "application " | TOPICAL_CREAM | CUTANEOUS | Status: DC | PRN
  Filled 2020-09-29: qty 5

## 2020-09-29 MED ORDER — NOREPINEPHRINE 16 MG/250ML-% IV SOLN
0.0000 ug/min | INTRAVENOUS | Status: DC
  Administered 2020-09-29: 14 ug/min via INTRAVENOUS
  Administered 2020-09-30: 7 ug/min via INTRAVENOUS
  Filled 2020-09-29 (×2): qty 250

## 2020-09-29 MED ORDER — ENOXAPARIN SODIUM 30 MG/0.3ML ~~LOC~~ SOLN
30.0000 mg | SUBCUTANEOUS | Status: DC
  Administered 2020-09-30: 30 mg via SUBCUTANEOUS
  Filled 2020-09-29 (×2): qty 0.3

## 2020-09-29 MED ORDER — ACETAMINOPHEN 325 MG PO TABS
650.0000 mg | ORAL_TABLET | ORAL | Status: DC | PRN
  Administered 2020-09-30 – 2020-10-01 (×2): 650 mg via ORAL
  Filled 2020-09-29 (×2): qty 2

## 2020-09-29 MED ORDER — NOREPINEPHRINE 4 MG/250ML-% IV SOLN
2.0000 ug/min | INTRAVENOUS | Status: DC
  Administered 2020-09-29: 12 ug/min via INTRAVENOUS
  Administered 2020-09-29: 5 ug/min via INTRAVENOUS
  Filled 2020-09-29: qty 250

## 2020-09-29 MED ORDER — SODIUM CHLORIDE 0.9% FLUSH: 10.0000 mL | INTRAVENOUS | Status: DC | PRN

## 2020-09-29 MED ORDER — LABETALOL HCL 5 MG/ML IV SOLN: 10.0000 mg | INTRAVENOUS | Status: AC | PRN

## 2020-09-29 MED ORDER — SODIUM CHLORIDE 0.9 % IV SOLN: INTRAVENOUS | Status: DC | PRN

## 2020-09-29 MED ORDER — SODIUM CHLORIDE 0.9 % IV SOLN: INTRAVENOUS | Status: DC

## 2020-09-29 MED ORDER — HEPARIN SODIUM (PORCINE) 1000 UNIT/ML DIALYSIS: 1000.0000 [IU] | INTRAMUSCULAR | Status: DC | PRN

## 2020-09-29 MED ORDER — SODIUM CHLORIDE 0.9% FLUSH
10.0000 mL | Freq: Two times a day (BID) | INTRAVENOUS | Status: DC
  Administered 2020-09-29 – 2020-10-01 (×4): 10 mL

## 2020-09-29 MED ORDER — HYDRALAZINE HCL 20 MG/ML IJ SOLN: 10.0000 mg | INTRAMUSCULAR | Status: AC | PRN

## 2020-09-29 MED ORDER — ASPIRIN 81 MG PO CHEW
81.0000 mg | CHEWABLE_TABLET | ORAL | Status: AC
  Administered 2020-09-29: 81 mg via ORAL
  Filled 2020-09-29: qty 1

## 2020-09-29 MED ORDER — ONDANSETRON HCL 4 MG/2ML IJ SOLN
4.0000 mg | Freq: Four times a day (QID) | INTRAMUSCULAR | Status: DC | PRN
  Administered 2020-09-30 (×2): 4 mg via INTRAVENOUS
  Filled 2020-09-29 (×2): qty 2

## 2020-09-29 MED ORDER — LIDOCAINE HCL (PF) 1 % IJ SOLN: 5.0000 mL | INTRAMUSCULAR | Status: DC | PRN

## 2020-09-29 MED ORDER — LIDOCAINE HCL (PF) 1 % IJ SOLN
INTRAMUSCULAR | Status: DC | PRN
  Administered 2020-09-29: 15 mL

## 2020-09-29 MED ORDER — PHENYLEPHRINE HCL-NACL 10-0.9 MG/250ML-% IV SOLN
0.0000 ug/min | INTRAVENOUS | Status: DC
Start: 2020-09-29 — End: 2020-09-29

## 2020-09-29 MED ORDER — MIDAZOLAM HCL 2 MG/2ML IJ SOLN
INTRAMUSCULAR | Status: AC
  Filled 2020-09-29: qty 2

## 2020-09-29 MED ORDER — HEPARIN (PORCINE) IN NACL 1000-0.9 UT/500ML-% IV SOLN
INTRAVENOUS | Status: AC
  Filled 2020-09-29: qty 1000

## 2020-09-29 MED ORDER — NOREPINEPHRINE 4 MG/250ML-% IV SOLN
0.0000 ug/min | INTRAVENOUS | Status: DC
  Administered 2020-09-29: 2 ug/min via INTRAVENOUS
  Filled 2020-09-29: qty 250

## 2020-09-29 MED ORDER — LIDOCAINE HCL (PF) 1 % IJ SOLN
INTRAMUSCULAR | Status: AC
  Filled 2020-09-29: qty 30

## 2020-09-29 SURGICAL SUPPLY — 12 items
CATH INFINITI 5 FR 3DRC (CATHETERS) ×1 IMPLANT
CATH INFINITI 5FR MULTPACK ANG (CATHETERS) ×1 IMPLANT
CATH SWAN GANZ 7F STRAIGHT (CATHETERS) ×1 IMPLANT
CLOSURE MYNX CONTROL 5F (Vascular Products) ×1 IMPLANT
CLOSURE MYNX CONTROL 6F/7F (Vascular Products) ×1 IMPLANT
KIT HEART LEFT (KITS) ×1 IMPLANT
PACK CARDIAC CATHETERIZATION (CUSTOM PROCEDURE TRAY) ×2 IMPLANT
SHEATH PINNACLE 5F 10CM (SHEATH) ×1 IMPLANT
SHEATH PINNACLE 7F 10CM (SHEATH) ×1 IMPLANT
SHEATH PROBE COVER 6X72 (BAG) ×1 IMPLANT
TRANSDUCER W/STOPCOCK (MISCELLANEOUS) ×2 IMPLANT
WIRE EMERALD 3MM-J .035X150CM (WIRE) ×1 IMPLANT

## 2020-09-29 NOTE — Progress Notes (Addendum)
Progress Note  Patient Name: Reg Bircher Date of Encounter: 09/15/2020  [facility] Cardiologist: Dr. Colen Darling. Bensimhon  Subjective   Sleeping on exam. Care discussed with wife by the bedside.  Inpatient Medications    Scheduled Meds: . ammonium lactate  1 application Topical Daily  . amoxicillin-clavulanate  1 tablet Oral Q12H  . [START ON 09/30/2020] aspirin  81 mg Oral Daily  . atorvastatin  40 mg Oral QHS  . chlorhexidine  15 mL Mouth Rinse BID  . Chlorhexidine Gluconate Cloth  6 each Topical Daily  . cinacalcet  30 mg Oral Q supper  . clopidogrel  75 mg Oral Daily  . [START ON 09/30/2020] enoxaparin (LOVENOX) injection  30 mg Subcutaneous Q24H  . fluticasone furoate-vilanterol  1 puff Inhalation Daily  . heparin  4,000 Units Dialysis Once in dialysis  . insulin aspart  0-9 Units Subcutaneous Q4H  . insulin glargine  25 Units Subcutaneous QHS  . lamoTRIgine  150 mg Oral Daily  . mouth rinse  15 mL Mouth Rinse q12n4p  . midodrine  10 mg Oral TID WC  . sevelamer carbonate  1,600 mg Oral TID WC  . sodium chloride flush  3 mL Intravenous Q12H  . sodium zirconium cyclosilicate  10 g Oral Once   Continuous Infusions: .  prismasol BGK 4/2.5    .  prismasol BGK 4/2.5    . sodium chloride    . sodium chloride    . sodium chloride    . amiodarone 30 mg/hr (08/31/2020 0600)  . norepinephrine (LEVOPHED) Adult infusion    . prismasol BGK 4/2.5     PRN Meds: sodium chloride, sodium chloride, acetaminophen, albuterol, alteplase, heparin, heparin, hydrALAZINE, hydrocerin, ipratropium-albuterol, labetalol, lidocaine (PF), lidocaine-prilocaine, ondansetron (ZOFRAN) IV, pentafluoroprop-tetrafluoroeth   Vital Signs    Vitals:   09/02/2020 1145 09/28/2020 1200 09/24/2020 1215 09/23/2020 1230  BP: (!) 89/53 111/62 (!) 102/55 101/85  Pulse: 80 66  67  Resp: 16 16 16 14   Temp:      TempSrc:      SpO2: 100% 100%  100%  Weight:      Height:        Intake/Output Summary  (Last 24 hours) at 09/16/2020 1235 Last data filed at 09/06/2020 1200 Gross per 24 hour  Intake 456.57 ml  Output 550 ml  Net -93.43 ml   Last 3 Weights 09/12/2020 09/28/2020 09/19/2020  Weight (lbs) 267 lb 10.2 oz 266 lb 12.1 oz 263 lb 10.7 oz  Weight (kg) 121.4 kg 121 kg 119.6 kg      Telemetry    SR IVCD- Personally Reviewed  ECG    Sinus rhythm, first-degree AV block, IVCD QRS duration 164 ms, QTC borderline at approximately 497 ms adjusted for wide QRS- Personally Reviewed  Physical Exam   GEN: sleeping Neck:  Not clearly visualized for JVP Cardiac: RRR, no murmurs, heart sounds distant Respiratory:  Shallow breaths, coarse diffusely GI: Soft MS:  Trace pretibial edema, left toe amputation   Labs    High Sensitivity Troponin:   Recent Labs  Lab 09/25/2020 1649 09/17/2020 1828  TROPONINIHS 98* 91*      Chemistry Recent Labs  Lab 09/23/20 1119 09/23/20 1119 09/24/20 0248 09/25/20 0353 09/11/2020 1649 09/28/20 0042 09/08/2020 0049  NA 132*   < > 136   < > 136 136 134*  K 5.0   < > 3.9   < > 4.4 4.8 4.5  CL 92*   < > 95*   < >  91* 91* 88*  CO2 24   < > 25   < > 24 24 23   GLUCOSE 133*   < > 124*   < > 290* 263* 197*  BUN 35*   < > 18   < > 19 22 31*  CREATININE 9.51*   < > 5.89*   < > 7.07* 7.41* 8.60*  CALCIUM 9.4   < > 9.0   < > 10.1 10.1 10.4*  PROT 7.0  --   --   --  7.6 7.1  --   ALBUMIN 3.1*   < > 2.8*  --  3.3* 3.2*  --   AST 58*  --   --   --  74* 71*  --   ALT 25  --   --   --  45* 50*  --   ALKPHOS 164*  --   --   --  158* 145*  --   BILITOT 1.3*  --   --   --  1.5* 1.3*  --   GFRNONAA 5*   < > 10*   < > 8* 7* 6*  ANIONGAP 16*   < > 16*   < > 21* 21* 23*   < > = values in this interval not displayed.     Hematology Recent Labs  Lab 09/03/2020 0356 09/22/2020 0356 09/22/2020 0923 09/28/20 0042 09/12/2020 0049  WBC 13.1*  --   --  18.9* 18.4*  RBC 3.48*  --   --  3.34* 3.72*  HGB 10.9*   < > 12.2* 10.5* 11.6*  HCT 33.7*   < > 36.0* 32.0* 35.2*   MCV 96.8  --   --  95.8 94.6  MCH 31.3  --   --  31.4 31.2  MCHC 32.3  --   --  32.8 33.0  RDW 16.8*  --   --  16.7* 17.3*  PLT 302  --   --  301 296   < > = values in this interval not displayed.    BNP Recent Labs  Lab 09/16/2020 0356  BNP 1,039.7*     DDimer No results for input(s): DDIMER in the last 168 hours.   Radiology    CARDIAC CATHETERIZATION  Result Date: 09/18/2020  Ost LM lesion is 100% stenosed.  Prox RCA to Mid RCA lesion is 100% stenosed.  1st Mrg lesion is 70% stenosed.  RIMA.  Prox RCA-1 lesion is 30% stenosed.  Previously placed Prox RCA-2 stent (unknown type) is widely patent.  Findings: Ao = 99/63 (77) LV = 100/35 RA =  32 RV = 60/26 PA = 66/23 (43) PCW = 35 Fick cardiac output/index = 4.7/2.1 PVR = 1.7 WU PAPi = 1.3 FA sat =100% PA sat = 57%, 60% Assessment: 1. LM: Totally occluded ostially (chronic) 2. RCA: Newly occluded in midsection (new since CABG 2018) 3. LIMA: Atretic 4. Free RIMA to LAD: WIdely patent. LAD is large vessel free of significant stenosis after graft insertion 5. SVG to OM1: Widely patent. Backfills a large portion of the LCX. There is a 70% lesion prior to graft insertion 6. Severe biventricular HF with markedly elevated filling pressures Plan/Discussion: Continue medical therapy. Will need HD today. [name], MD 9:10 AM   ECHOCARDIOGRAM COMPLETE  Result Date: 09/08/2020    ECHOCARDIOGRAM REPORT   Patient Name:   RONDA KAZMI Date of Exam: 09/28/2020 Medical Rec #:  [id number]       Height:  66.5 in Accession #:    [id number]      Weight:       277.6 lb Date of Birth:  [birth year]        BSA:          2.312 m Patient Age:    74 years        BP:           100/62 mmHg Patient Gender: M               HR:           74 bpm. Exam Location:  Inpatient Procedure: 2D Echo, Cardiac Doppler, Color Doppler and Intracardiac            Opacification Agent Indications:    R06.02 SOB  History:        Patient has prior history of Echocardiogram  examinations, most                 recent 11/16/2009. Arrythmias:Cardiac Arrest;                 Signs/Symptoms:Hypotension.  Sonographer:    Merrie Roof RDCS Referring Phys: [id number] Raynaldo Opitz RAINES IMPRESSIONS  1. No mural thrombus with Definity contrast. Left ventricular ejection fraction, by estimation, is <20%. The left ventricle has severely decreased function. The left ventricle demonstrates global hypokinesis. The left ventricular internal cavity size was moderately dilated. Left ventricular diastolic parameters are consistent with Grade II diastolic dysfunction (pseudonormalization). Elevated left ventricular end-diastolic pressure.  2. Right ventricular systolic function is mildly reduced. The right ventricular size is normal. There is normal pulmonary artery systolic pressure.  3. Left atrial size was moderately dilated.  4. Right atrial size was severely dilated.  5. ? may be some degree of MS - aliasing noted during diastole in the LV suggesting restricted diastolic filling. The mitral valve was not well visualized. No evidence of mitral valve regurgitation. Moderate mitral annular calcification.  6. The aortic valve is tricuspid. Aortic valve regurgitation is not visualized.  7. Aortic dilatation noted. There is borderline dilatation of the ascending aorta, measuring 38 mm.  8. The inferior vena cava is dilated in size with <50% respiratory variability, suggesting right atrial pressure of 15 mmHg. FINDINGS  Left Ventricle: No mural thrombus with Definity contrast. Left ventricular ejection fraction, by estimation, is <20%. The left ventricle has severely decreased function. The left ventricle demonstrates global hypokinesis. Definity contrast agent was given IV to delineate the left ventricular endocardial borders. The left ventricular internal cavity size was moderately dilated. There is no left ventricular hypertrophy. Left ventricular diastolic parameters are consistent with Grade II diastolic  dysfunction (pseudonormalization). Elevated left ventricular end-diastolic pressure. Right Ventricle: The right ventricular size is normal. No increase in right ventricular wall thickness. Right ventricular systolic function is mildly reduced. There is normal pulmonary artery systolic pressure. The tricuspid regurgitant velocity is 1.86 m/s, and with an assumed right atrial pressure of 15 mmHg, the estimated right ventricular systolic pressure is 30.8 mmHg. Left Atrium: Left atrial size was moderately dilated. Right Atrium: Right atrial size was severely dilated. Pericardium: There is no evidence of pericardial effusion. Mitral Valve: ? may be some degree of MS - aliasing noted during diastole in the LV suggesting restricted diastolic filling. The mitral valve was not well visualized. Moderate mitral annular calcification. No evidence of mitral valve regurgitation. Tricuspid Valve: The tricuspid valve is grossly normal. Tricuspid valve regurgitation is mild. Aortic Valve: The aortic valve is tricuspid.  Aortic valve regurgitation is not visualized. Pulmonic Valve: The pulmonic valve was grossly normal. Pulmonic valve regurgitation is trivial. Aorta: Aortic dilatation noted. There is borderline dilatation of the ascending aorta, measuring 38 mm. Venous: The inferior vena cava is dilated in size with less than 50% respiratory variability, suggesting right atrial pressure of 15 mmHg. IAS/Shunts: The interatrial septum was not well visualized.  LEFT VENTRICLE PLAX 2D LVIDd:         6.20 cm      Diastology LVIDs:         5.50 cm      LV e' medial:    3.70 cm/s LV PW:         0.90 cm      LV E/e' medial:  24.9 LV IVS:        0.90 cm      LV e' lateral:   7.62 cm/s LVOT diam:     2.20 cm      LV E/e' lateral: 12.1 LV SV:         49 LV SV Index:   21 LVOT Area:     3.80 cm  LV Volumes (MOD) LV vol d, MOD A2C: 146.0 ml LV vol d, MOD A4C: 177.0 ml LV vol s, MOD A2C: 90.0 ml LV vol s, MOD A4C: 148.0 ml LV SV MOD A2C:     56.0  ml LV SV MOD A4C:     177.0 ml LV SV MOD BP:      46.2 ml RIGHT VENTRICLE          IVC RV Basal diam:  4.30 cm  IVC diam: 2.20 cm RV Mid diam:    4.10 cm TAPSE (M-mode): 1.3 cm LEFT ATRIUM             Index       RIGHT ATRIUM           Index LA diam:        4.40 cm 1.90 cm/m  RA Area:     31.80 cm LA Vol (A2C):   89.2 ml 38.59 ml/m RA Volume:   121.00 ml 52.35 ml/m LA Vol (A4C):   88.9 ml 38.46 ml/m LA Biplane Vol: 94.6 ml 40.92 ml/m  AORTIC VALVE LVOT Vmax:   68.50 cm/s LVOT Vmean:  42.600 cm/s LVOT VTI:    0.129 m  AORTA Ao Root diam: 3.20 cm Ao Asc diam:  3.80 cm MITRAL VALVE               TRICUSPID VALVE MV Area (PHT): 4.26 cm    TR Peak grad:   13.8 mmHg MV Decel Time: 178 msec    TR Vmax:        186.00 cm/s MV E velocity: 92.10 cm/s MV A velocity: 85.30 cm/s  SHUNTS MV E/A ratio:  1.08        Systemic VTI:  0.13 m                            Systemic Diam: 2.20 cm Lyman Bishop MD Electronically signed by Lyman Bishop MD Signature Date/Time: 09/21/2020/4:15:22 PM    Final     Cardiac Studies   Echocardiogram 09/17/2020 -Image quality is suboptimal despite use of Definity -Best preserved wall motion is in the lateral wall, suggesting EF may be approximately 20%. -RV function is mildly reduced and RV appears mildly enlarged. -Atria are poorly visualized for measurement. -At least moderate  tricuspid valve regurgitation -Mitral valve poorly visualized.  Patient Profile     74 y.o. 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  History of inferior STEMI in 2011 with intervention on a totally occluded RCA and staged PCI of the mid LAD.  Subsequent CABG, and 2018 had subsequent MI and underwent redo CABG.  Developed shock requiring ECMO, subsequently required dialysis.  Admitted for accidental Dilaudid overdose, cardiology involved  due to episode of sustained ventricular tachycardia requiring CPR and epinephrine. Assessment & Plan    1.  Ventricular tachycardia 2.  Ischemic cardiomyopathy 3.  CAD status post CABG 2017, redo CABG 2018 -Witnessed VT arrest in the emergency room requiring CPR and single dose of epinephrine.  Electrolytes were normal, no ischemia on last nuclear stress test December 2020.  EF felt to lower than prior, 25% at the [facility], approximately 20% obtained yesterday. 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  - LHC - newly occluded mid RCA, chronic LM occlusion, atretic LIMA, RIMA to LAD patent, SVG to OM1 patent with backfilling of LCX.  -RHC - PA 43 mmHg, PCW 35 mmHg, CI 2.1, PA sat 60%. Biventricular failure with markedly elevated filling pressures.  -Discussed with nephrology - plan for IHD today however concern for hypotension, discussed low dose norepinephrine however peripheral access is suboptimal. Discussed further and plan is for CRRT. - At this point, consult with AHF for guidance on volume management in setting of ESRD and biventricular failure. Discussed with wife who agrees. -will also consult EP for discussion of ICD or antiarrhythmic therapy for VT episode. I suspect he is a poor candidate for device therapy given comorbidities, and his wife tells me his is what she was told at the [place] as well.  For questions or updates, please contact [facility] Please consult [email or web address] for contact info under        Signed, Elouise Munroe, MD  09/14/2020, 12:35 PM    CRITICAL CARE Performed by: Cherlynn Kaiser, MD   Total critical care time: 35 minutes   Critical care time was exclusive of separately billable procedures and treating other patients.   Critical care was necessary to treat or prevent imminent or life-threatening deterioration.   Critical care was time spent personally by me (independent of APPs or residents) on the following activities:  development of treatment plan with patient and/or surrogate as well as nursing, discussions with consultants, evaluation of patient's response to treatment, examination of patient, obtaining history from patient or surrogate, ordering and performing treatments and interventions, ordering and review of laboratory studies, ordering and review of radiographic studies, pulse oximetry and re-evaluation of patient's condition.

## 2020-09-29 NOTE — Consult Note (Addendum)
Advanced Heart Failure Team Consult Note   Primary Physician: [place] PCP-Cardiologist:  No primary care provider on file.  Reason for Consultation: Advanced HF  HPI:    Connor Hoover is seen today for evaluation of advanced HF at the request of Dr. Margaretann Loveless.   Connor Hoover is a 74 y/o male with h/o obesity, HTN. 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 At the time he was on PD and eventually switched to HD for better volume control.     Regarding his cardiac history he had inferior STEMI in 10/2009. Had emergent PCI of totally-occluded RCA. Then had staged PCI of m LAD. Subsequently CABG at [facility] in 2017. In 2018 had another MI and underwent re-do CABG at [facility] as grafts were occluded including atretic LIMA  (had high-grade LM and patent stents in RCA. (underwent free RIMA to LAD and SVG to OM). Developed shock requiring ECMO and says when he woke he was on dialysis.   Admitted in 12/20 with SOB felt to be related to insufficient PD. So eventually switched to HD CT scan no PE. Echo EF 35% (stable) Nuclear scan with multiple fixed defects. No ischemia.   Admitted in mid November at [facility] due to osteomyelitis of the left toe.  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  Echo EF went down to 25%.  He was eventually discharged on metoprolol succinate 25 mg daily, Plavix and midodrine 10 mg 3 times weekly in the morning of dialysis session.  Patient was readmitted to [facility] from 11/24-11/26 due to acute on chronic systolic heart failure and weakness after missing hemodialysis session.  During the hospitalization, note indicated he had an episode of nonsustained VT.  Unfortunately his metoprolol succinate had to be discontinued secondary to hypotension with systolic blood pressure in the 90s.   Patient was eventually discharged home 2 days ago on 09/25/2020.  He underwent hemodialysis on 11/27 and he took midodrine prior to the hemodialysis session.  Due to persistent peripheral pain, patient took Dilaudid.  Shortly after, he became unresponsive and had significant altered mental status.  EMS was called.  On arrival, he reportedly had pinpoint pupils and was given a Narcan with improvement of his mental status.  He was brought to [facility] ED for further observation.  While talking to his wife in the ED, patient had a 2-minute onset of VT arrest.  CPR was immediately started and he was given a dose of epinephrine.  He quickly achieved ROSC. He was admitted for further care.  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  Hospitalization complicated by hypotension and inability to remove fluid with HD.  Echo EVF < 20. RV moderately HK.   Cath today with severe 3vCAD with patent grafts. Massive volume overload with biventricular HF with CVP 31 and PCWP 35. CI 2.1 (PA sat 59%) PaPi 1.3  Post cath attempted HD but unable to tolerate even with NE 10. Trialysis cath placed for CVVHD    Review of Systems: [y] = yes, [ ]  = no   . General: Weight gain Blue.Reese ]; Weight loss [ ] ; Anorexia [ ] ; Fatigue Blue.Reese ]; Fever [ ] ; Chills [ ] ; Weakness Blue.Reese ]  . Cardiac: Chest pain/pressure [ ] ;  Resting SOB [ y]; Exertional SOB Blue.Reese ]; Orthopnea [ ] ; Pedal Edema [ y]; Palpitations [ ] ; Syncope Blue.Reese ]; Presyncope [ ] ; Paroxysmal nocturnal dyspnea[ ]   . Pulmonary: Cough [ ] ; Wheezing[ ] ; Hemoptysis[ ] ; Sputum [ ] ; Snoring Blue.Reese ]  . GI: Vomiting[ ] ; Dysphagia[ ] ; Melena[ ] ; Hematochezia [ ] ; Heartburn[ ] ; Abdominal pain [ ] ; Constipation [ ] ; Diarrhea [ ] ; BRBPR [ ]   . GU: Hematuria[ ] ; Dysuria [ ] ; Nocturia[ ]   . Vascular: Pain in legs with walking Blue.Reese ]; Pain in feet with lying flat [ y]; Non-healing sores Blue.Reese ]; Stroke [ ] ; TIA [ ] ; Slurred speech [ ] ;  . Neuro: Headaches[ ] ; Vertigo[ ] ; Seizures[ ] ;  Paresthesias[ ] ;Blurred vision [ ] ; Diplopia [ ] ; Vision changes [ ]   . Ortho/Skin: Arthritis [ y]; Joint pain Blue.Reese ]; Muscle pain Blue.Reese ]; Joint swelling [ y]; Back Pain [ y]; Rash [ ]   . Psych: Depression[ y]; Anxiety[ ]   . Heme: Bleeding problems [ ] ; Clotting disorders [ ] ; Anemia [ y]  . Endocrine: Diabetes [ y]; Thyroid dysfunction[ ]   Home Medications Prior to Admission medications   Medication Sig Start Date End Date Taking? Authorizing Provider  acetaminophen (TYLENOL) 325 MG tablet Take 650 mg by mouth every 6 (six) hours as needed for mild pain or fever.   Yes [provider]  albuterol (VENTOLIN HFA) 108 (90 Base) MCG/ACT inhaler Inhale 2 puffs into the lungs every 6 (six) hours as needed for wheezing or shortness of breath.   Yes [provider]  ammonium lactate (LAC-HYDRIN) 12 % lotion Apply 1 application topically daily.   Yes [provider]  aspirin 81 MG chewable tablet Chew 81 mg by mouth daily.   Yes [provider]  atorvastatin (LIPITOR) 80 MG tablet Take 40 mg by mouth at bedtime.    Yes [provider]  B Complex-C-Folic Acid (B COMPLEX-VITAMIN C-FOLIC ACID) 1 MG tablet Take 1 tablet by mouth daily. 10/17/17  Yes [provider]  bacitracin 500 UNIT/GM ointment Apply 1 application topically See admin instructions. Apply small amount topically every day for the first week. Only apply after cleaning and before applying gauze dressing. Discontinue after first week.   Yes [provider]  cinacalcet (SENSIPAR) 30 MG tablet Take 30 mg by mouth daily. With largest meal   Yes [provider]  clopidogrel (PLAVIX) 75 MG tablet Take 75 mg by mouth daily.   Yes [provider]  clotrimazole (LOTRIMIN) 1 % cream Apply 1 application topically daily.   Yes [provider]  docusate sodium (COLACE) 50 MG capsule Take 50 mg by mouth 2 (two) times daily.   Yes [provider]   Fluticasone-Salmeterol (ADVAIR) 100-50 MCG/DOSE AEPB Inhale 1 puff into the lungs 2 (two) times daily.   Yes [provider]  hydrocerin (EUCERIN) CREA Apply 1 application topically 2 (two) times daily as needed (itching).    Yes [provider]  HYDROmorphone (DILAUDID) 2 MG tablet Take 2 mg by mouth every 6 (six) hours as needed for moderate pain or severe pain.   Yes [provider]  insulin glargine (LANTUS) 100 UNIT/ML injection Inject 0.26 mLs (26 Units total) into the skin at bedtime. 09/25/20  Yes Aslam, Loralyn Freshwater, MD  insulin NPH-regular Human (70-30) 100 UNIT/ML injection Inject 4 Units into the skin in the morning, at noon, and at bedtime.   Yes [provider]  ipratropium-albuterol (DUONEB) 0.5-2.5 (3) MG/3ML SOLN  Take 3 mLs by nebulization in the morning and at bedtime.   Yes [provider]  lamoTRIgine (LAMICTAL) 150 MG tablet Take 150 mg by mouth daily.   Yes [provider]  lidocaine (LMX) 4 % cream Apply 1 application topically as needed. Patient taking differently: Apply 1 application topically 2 (two) times daily as needed (dry skin).  09/15/19  Yes Joy, Shawn C, PA-C  midodrine (PROAMATINE) 10 MG tablet Take 1 pill with food before dialysis on Tuesday, Thursday and Saturday. 09/25/20  Yes Aslam, Loralyn Freshwater, MD  Multiple Vitamin (RENAL MULTIVITAMIN/ZINC PO) Take 1 tablet by mouth daily with supper.   Yes [provider]  sevelamer carbonate (RENVELA) 800 MG tablet Take 1,600 mg by mouth 3 (three) times daily with meals.   Yes [provider]  triamcinolone (KENALOG) 0.1 % Apply 1 application topically 2 (two) times daily.   Yes [provider]    Past Medical History: Past Medical History:  Diagnosis Date  . Cardiomyopathy ([place])   . Diabetes mellitus without complication ([place])   . ESRD (end stage renal disease) ([place])    Hemodialysis T, Th, Sat  . Hypertension   . Osteomyelitis ([place])   . PTSD  (post-traumatic stress disorder)     Past Surgical History: Past Surgical History:  Procedure Laterality Date  . AMPUTATION     Great toe amputation.    [facility] CARDIAC SURGERY     CABG and redo  . RIGHT/LEFT HEART CATH AND CORONARY ANGIOGRAPHY N/A 09/11/2020   Procedure: RIGHT/LEFT HEART CATH AND CORONARY ANGIOGRAPHY;  Surgeon: Jolaine Artist, MD;  Location: [facility];  Service: Cardiovascular;  Laterality: N/A;    Family History: History reviewed. No pertinent family history.  Social History: Social History   Socioeconomic History  . Marital status: Legally Separated    Spouse name: Not on file  . Number of children: Not on file  . Years of education: Not on file  . Highest education level: Not on file  Occupational History  . Not on file  Tobacco Use  . Smoking status: Former Research scientist (life sciences)  . Smokeless tobacco: Former Network engineer  . Vaping Use: Never used  Substance and Sexual Activity  . Alcohol use: Not Currently  . Drug use: Not Currently  . Sexual activity: Not on file  Other Topics Concern  . Not on file  Social History Narrative  . Not on file   Social Determinants of Health   Financial Resource Strain:   . Difficulty of Paying Living Expenses: Not on file  Food Insecurity:   . Worried About Charity fundraiser in the Last Year: Not on file  . Ran Out of Food in the Last Year: Not on file  Transportation Needs:   . Lack of Transportation (Medical): Not on file  . Lack of Transportation (Non-Medical): Not on file  Physical Activity:   . Days of Exercise per Week: Not on file  . Minutes of Exercise per Session: Not on file  Stress:   . Feeling of Stress : Not on file  Social Connections:   . Frequency of Communication with Friends and Family: Not on file  . Frequency of Social Gatherings with Friends and Family: Not on file  . Attends Religious Services: Not on file  . Active Member of Clubs or Organizations: Not on file  . Attends Theatre manager Meetings: Not on file  . Marital Status: Not on file  Allergies:  Allergies  Allergen Reactions  . Lisinopril Swelling    Made the tongue swell and resulted in a trip to the ER  . Povidine [Povidone Iodine] Itching    Objective:    Vital Signs:   Temp:  [97.5 F (36.4 C)-97.7 F (36.5 C)] 97.7 F (36.5 C) (11/30 1900) Pulse Rate:  [63-122] 66 (11/30 1745) Resp:  [12-24] 20 (11/30 2000) BP: (47-178)/(27-154) 140/38 (11/30 2000) SpO2:  [98 %-100 %] 100 % (11/30 1745) Weight:  [121.4 kg] 121.4 kg (11/30 0500) Last BM Date:  (PTA)  Weight change: Filed Weights   09/07/2020 1545 09/28/20 0500 09/22/2020 0500  Weight: 119.6 kg 121 kg 121.4 kg    Intake/Output:   Intake/Output Summary (Last 24 hours) at 09/17/2020 2016 Last data filed at 09/23/2020 2000 Gross per 24 hour  Intake 1072.7 ml  Output 228 ml  Net 844.7 ml      Physical Exam    General:  Obese male lying in bed. Grunting at times HEENT: normal Neck: supple. JVP to ear . Carotids 2+ bilat; no bruits. No lymphadenopathy or thyromegaly appreciated. Cor: PMI nondisplaced. Regular rate & rhythm. 2/6 TR Lungs: coarse anteriroly Abdomen: markedly obese soft, nontender, + distended. No hepatosplenomegaly. No bruits or masses. Good bowel sounds. Extremities: no cyanosis, clubbing, rash, 1+ edema Neuro: alert & orientedx3, cranial nerves grossly intact. moves all 4 extremities w/o difficulty. 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, 1AVB) Personally reviewed  Labs   Basic Metabolic Panel: Recent Labs  Lab  0000 09/24/20 0248 09/25/20 0353 09/25/20 1038 09/07/2020 0356 09/05/2020 0916 09/07/2020 0923 09/19/2020 1649 09/07/2020 1649 09/28/20 0042 09/28/20 0042 08/31/2020 0049 09/15/2020 0821 09/08/2020 0827 09/09/2020 0828 09/09/2020 1449  NA  --  136   < >  --  136  --    < > 136   < > 136   < > 134* 136 134* 134* 135   K  --  3.9   < >  --  4.2  --    < > 4.4   < > 4.8   < > 4.5 3.9 3.9 4.1 4.4  CL  --  95*   < >  --  90*  --   --  91*  --  91*  --  88*  --   --   --  89*  CO2  --  25   < >  --  26  --   --  24  --  24  --  23  --   --   --  23  GLUCOSE  --  124*   < >  --  190*  --   --  290*  --  263*  --  197*  --   --   --  119*  BUN  --  18   < >  --  16  --   --  19  --  22  --  31*  --   --   --  34*  CREATININE  --  5.89*   < >  --  6.40*  --   --  7.07*  --  7.41*  --  8.60*  --   --   --  9.02*  CALCIUM   < > 9.0   < >  --  10.0  --   --  10.1   < > 10.1  --  10.4*  --   --   --  10.3  MG  --   --   --  2.5*  --  2.5*  --  2.5*  --  2.6*  --  2.7*  --   --   --   --   PHOS  --  4.1  --   --   --   --   --  6.1*  --   --   --   --   --   --   --  6.9*   < > = values in this interval not displayed.    Liver Function Tests: Recent Labs  Lab 09/23/20 1119 09/24/20 0248 09/03/2020 1649 09/28/20 0042 09/20/2020 1449  AST 58*  --  74* 71*  --   ALT 25  --  45* 50*  --   ALKPHOS 164*  --  158* 145*  --   BILITOT 1.3*  --  1.5* 1.3*  --   PROT 7.0  --  7.6 7.1  --   ALBUMIN 3.1* 2.8* 3.3* 3.2* 3.4*   No results for input(s): LIPASE, AMYLASE in the last 168 hours. No results for input(s): AMMONIA in the last 168 hours.  CBC: Recent Labs  Lab 09/24/20 0248 09/24/20 0248 09/25/20 0353 09/25/20 0353 08/31/2020 0356 09/28/2020 0923 09/28/20 0042 09/28/20 0042 09/17/2020 0049 09/18/2020 0821 09/10/2020 0827 09/08/2020 0828 09/06/2020 1449  WBC 8.7   < > 9.5  --  13.1*  --  18.9*  --  18.4*  --   --   --  14.4*  NEUTROABS 6.3  --   --   --  10.0*  --   --   --   --   --   --   --   --   HGB 10.0*   < > 11.0*   < > 10.9*   < > 10.5*   < > 11.6* 11.9* 11.9* 12.6* 11.9*  HCT 29.7*   < > 33.1*   < > 33.7*   < > 32.0*   < > 35.2* 35.0* 35.0* 37.0* 36.6*  MCV 92.5   < > 94.0  --  96.8  --  95.8  --  94.6  --   --   --  95.8  PLT 225   < > 249  --  302  --  301  --  296  --   --   --  299   < > = values in  this interval not displayed.    Cardiac Enzymes: No results for input(s): CKTOTAL, CKMB, CKMBINDEX, TROPONINI in the last 168 hours.  BNP: BNP (last 3 results) Recent Labs    09/17/2020 0356  BNP 1,039.7*    ProBNP (last 3 results) No results for input(s): PROBNP in the last 8760 hours.   CBG: Recent Labs  Lab 09/28/20 2333 09/21/2020 0324 09/01/2020 1102 09/20/2020 1618 09/17/2020 1943  GLUCAP 368* 176* 128* 126* 126*    Coagulation Studies: No results for input(s): LABPROT, INR in the last 72 hours.   Imaging   CARDIAC CATHETERIZATION  Result Date: 09/07/2020  Ost LM lesion is 100% stenosed.  Prox RCA to Mid RCA lesion is 100% stenosed.  1st Mrg lesion is 70% stenosed.  RIMA.  Prox RCA-1 lesion is 30% stenosed.  Previously placed Prox RCA-2 stent (unknown type) is widely patent.  Findings: Ao = 99/63 (77) LV = 100/35  RA =  32 RV = 60/26 PA = 66/23 (43) PCW = 35 Fick cardiac output/index = 4.7/2.1 PVR = 1.7 WU PAPi = 1.3 FA sat =100% PA sat = 57%, 60% Assessment: 1. LM: Totally occluded ostially (chronic) 2. RCA: Newly occluded in midsection (new since CABG 2018) 3. LIMA: Atretic 4. Free RIMA to LAD: WIdely patent. LAD is large vessel free of significant stenosis after graft insertion 5. SVG to OM1: Widely patent. Backfills a large portion of the LCX. There is a 70% lesion prior to graft insertion 6. Severe biventricular HF with markedly elevated filling pressures Plan/Discussion: Continue medical therapy. Will need HD today. [name], MD 9:10 AM      Medications:     Current Medications: . ammonium lactate  1 application Topical Daily  . amoxicillin-clavulanate  1 tablet Oral Q12H  . [START ON 09/30/2020] aspirin  81 mg Oral Daily  . atorvastatin  40 mg Oral QHS  . chlorhexidine  15 mL Mouth Rinse BID  . Chlorhexidine Gluconate Cloth  6 each Topical Daily  . cinacalcet  30 mg Oral Q supper  . clopidogrel  75 mg Oral Daily  . [START ON 09/30/2020] enoxaparin  (LOVENOX) injection  30 mg Subcutaneous Q24H  . fluticasone furoate-vilanterol  1 puff Inhalation Daily  . heparin  4,000 Units Dialysis Once in dialysis  . insulin aspart  0-9 Units Subcutaneous Q4H  . insulin glargine  25 Units Subcutaneous QHS  . lamoTRIgine  150 mg Oral Daily  . mouth rinse  15 mL Mouth Rinse q12n4p  . midodrine  10 mg Oral TID WC  . sevelamer carbonate  1,600 mg Oral TID WC  . sodium chloride flush  3 mL Intravenous Q12H  . sodium zirconium cyclosilicate  10 g Oral Once     Infusions: .  prismasol BGK 4/2.5 400 mL/hr at 08/31/2020 1740  .  prismasol BGK 4/2.5 200 mL/hr at 09/09/2020 1740  . sodium chloride    . sodium chloride    . sodium chloride 10 mL/hr at 09/22/2020 1800  . amiodarone 30 mg/hr (09/03/2020 2000)  . norepinephrine (LEVOPHED) Adult infusion    . prismasol BGK 4/2.5 2,000 mL/hr at 09/28/2020 1957       Assessment/Plan   1. 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 CVP 31 and PCWP 35. CI 2.1 (PA sat 59%) PaPi 1.3 - NYHA IV - Massively volume overloaded and unable to pull fluid with HD due to hypotension  2. ESRD on HD - As above massively volume overloaded and unable to pull fluid with HD due to hypotension - d/w Renal and trialysis catheter placed  3. VT arrest - in setting of respiratory failure and accidental dilaudid overdose  4. CAD s/p re-do CABG - cath 11/30 with severe native CAD. RIMA to LAD and SVG to OM patent - medical therapy  5. PAD - s/p recent amputation  I am worried that he has end-stage HF with persistent hypertension prohibiting adequate fluid removal with HD. We have placed him on pressor/intoropic support and will start CVVHD for fluid removal put I think that these are all temporizing measures and I do not think we will be able to maintain him as an outpatient given severe biventricular dysfunction and hypotension. Will need to  initiate palliative care discussions with patient and family. He is not candidate for ICD given comorbidities and prognosis <  1 yr.   CRITICAL CARE Performed by: [name]  Total critical care time: 55 minutes  Critical care time was exclusive of separately billable procedures and treating other patients.  Critical care was necessary to treat or prevent imminent or life-threatening deterioration.  Critical care was time spent personally by me (independent of midlevel providers or residents) on the following activities: development of treatment plan with patient and/or surrogate as well as nursing, discussions with consultants, evaluation of patient's response to treatment, examination of patient, obtaining history from patient or surrogate, ordering and performing treatments and interventions, ordering and review of laboratory studies, ordering and review of radiographic studies, pulse oximetry and re-evaluation of patient's condition.  [name], MD  8:32 PM   Length of Stay: 2  [name], MD  09/05/2020, 8:16 PM  Advanced Heart Failure Team Pager [phone] (M-F; 7a - 4p)  Please contact [facility] for night-coverage after hours (4p -7a ) and weekends on [email or web address]

## 2020-09-29 NOTE — Progress Notes (Signed)
This chaplain responded to spiritual care page.  The chaplain understands Pt. is ready to notarize HCPOA.  The chaplain informed the Pt. RN-Kacie a notary is not available this afternoon.    Plans were made for the unit chaplain to F/U on Wednesday morning. The RN will  inform spiritual care when the Pt. wife arrives.The chaplain understands the Pt. wife prefers to be present for the notarizing of HCPOA.  F/U spiritual care is available as needed.

## 2020-09-29 NOTE — Progress Notes (Signed)
NAME:  Connor Hoover, MRN:  [id number], DOB:  13-May-1946, LOS: 2 ADMISSION DATE:  09/08/2020, CONSULTATION DATE:  11/28 REFERRING MD:  Dr. Christy Gentles, CHIEF COMPLAINT:  Cardiac Arrest    Brief History   74 y/o M who presented for AMS 11/28 after dosing of dilaudid for dressing change. Was admitted for evaluation by IMTS.  Developed 2 min VT arrest in ER with ROSC and return of mental status.   History of present illness   74 y/o M who presented to [facility] on 11/28 for AMS.   The patient is a poor historian but his wife provides detailed information.  He is followed at the [facility].  The patient developed what he thought was an in-grown toenail and it became infected requiring admission from 09/16/20 through 11/21 at the [place].  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 He was also identified to have worsening LVEF on ECHO, PAD on LLE requiring angioplasty.  He fell prior to leaving the hospital and had difficulty getting into the car for discharge - he required 3 people to get into the car. At discharge, he was started on lopressor, plavix and midodrine.  After returning home, he continued to have weakness, nausea and vomiting.  He was readmitted to [facility] from 11/24 - 11/26 for acute on chronic systolic CHF, weakness. Lopressor was held and he was noted to have an episode of asymptomatic VT on 11/25.  He is normally a T/Th/S HD patient and was changed to Monday for the week of Thanksgiving for the holiday. He was unable to make it to HD on Monday due to fatigue. He was discharged off his lopressor due to soft BP's.   Wife reports am of admit he woke and was short of breath.  She tried oxygen and CPAP but they did not relieve his dyspnea.  She reports he "passed out" and EMS was called. He became responsive during the 911 call and EMS transported him to the ER.  In the ER his labs were relatively normal.  Wife notes he had dilaudid on 11/27 at 4pm for a wound VAC  change.  In the ER, he had an episode of VT and required 2 min CPR with return of ROSC after 1 epi and normal mental status.  He was placed on BiPAP after for comfort.   PCCM called for evaluation.   Past Medical History  DM II  ESRD  Chronic Combined CHF / HFrEF  CAD - Inferior STEMI 10/2009 s/p PCI of RCA, s/p CABG PAD  Osteomyelitis - left great toe s/p amputation at [facility], d/c'd to SNF on 11/21.   Significant Hospital Events   11/28 Admit, 2 min VT arrest 11/30 R/LHC/ iHD  Consults:  Cardiology  Nephrology   Procedures:  11/30 [facility]; iHD  Significant Diagnostic Tests:  11/28 CXR>>Cardiac enlargement with improving vascular congestion. Atelectasis in the left base with possible small left pleural effusion, unchanged.  11/28 Echo>>No mural thrombus with Definity contrast. Left ventricular ejection fraction, by estimation, is <20%. The left ventricle has severely decreased function. The left ventricle demonstrates global hypokinesis. The left ventricular internal cavity size was moderately dilated. Left ventricular diastolic parameters are consistent with Grade II diastolic dysfunction (pseudonormalization). Elevated left ventricular end-diastolic pressure.   11/30 R/LHC >> Ao = 99/63 (77) LV = 100/35 RA =  32 RV = 60/26 PA = 66/23 (43) PCW = 35 Fick cardiac output/index = 4.7/2.1 PVR = 1.7 WU  PAPi = 1.3 FA sat =100% PA sat = 57%, 60%  1. LM: Totally occluded ostially (chronic) 2. RCA: Newly occluded in midsection (new since CABG 2018) 3. LIMA: Atretic 4. Free RIMA to LAD: WIdely patent. LAD is large vessel free of significant stenosis after graft insertion 5. SVG to OM1: Widely patent. Backfills a large portion of the LCX. There is a 70% lesion prior to graft insertion 6. Severe biventricular HF with markedly elevated filling pressures  Micro Data:  COVID 11/28 >> negative  Influenza A/B 11/28 >> negative   Antimicrobials:  11/24 vanc/ zosyn  11/29 Augmentin   >>  Interim history/subjective:  Went for R/ LHC this morning.  RA 32 and PCW 35 Hypotensive, started on peripheral NE to support iHD, however was stopped due to ongoing hypotension with plans to change to CRRT Afebrile  No complaints from patient- denies SOB/ CP  Objective   Blood pressure (!) 85/50, pulse 73, temperature 97.6 F (36.4 C), temperature source Oral, resp. rate 17, height 5' 6.5" (1.689 m), weight 121.4 kg, SpO2 100 %.    FiO2 (%):  [40 %] 40 %   Intake/Output Summary (Last 24 hours) at 09/26/2020 1050 Last data filed at 09/16/2020 0600 Gross per 24 hour  Intake 382.08 ml  Output 550 ml  Net -167.92 ml   Filed Weights   09/01/2020 1545 09/28/20 0500 09/14/2020 0500  Weight: 119.6 kg 121 kg 121.4 kg   General:  Pleasant, chronically ill appearing older male in NAD, morbidly obese HEENT: MM pink/moist Neuro: Alert, oriented, MAE CV: rr, no murmur, LUE AVF +t/b PULM:  Non labored, diffuse insp wheeze, diminished in bases GI: soft, bs active  Extremities: warm/dry, +1LE edema, chronic lower wound changes, wound vac to left foot Skin: no rashes   Resolved Hospital Problem list     Assessment & Plan:   Witnessed VT Arrest with ischemic cardiomyopathy, CAD s/p CABG 2017 and redo 2018  Biventricular heart failure 2 min CPR in ER, s/p epi x1 P: - trop peaked at 30, EF worsening compared to recent s/u at [facility] following.   Martin Majestic for [facility] this morning- > showing chronic occlusions and newly occluded RCA since CABG 2018, severe biventricular HF with markedly elevated pressures w/ recs to continue medical therapy and fluid removal per HD - Continue Amiodarone, Plavix, Lipitor   Acute Metabolic Encephalopathy / Accidental Overdose  In setting of narcotic administration for recent amputation  P: - resolved, continue to minimize sedating meds - serial neuro exams   Chronic Hypotension  P: - continue Midodrine 10mg  TID - peripheral NE started to help  facilitate iHD, however given need for amount of fluid removal and ongoing hypotension; decision made to transition to CRRT - suspect his cuff BP may not be too accurate given his mental status remains great with systolic readings in the 73'U.    ESRD, hyperkalemia Developed shock after his CABG requiring ECMO and dialysis, TTS schedule  P: - Nephrology following - transitioning from iHD to CRRT today; will place temporary dialysis catheter  COPD without Exacerbation  OSA  Possible aspiration PNA in the setting of chest compressions Has home CPAP, does not wear  P: -CPAP QHS    -continue Breo - continue Augmentin day 3/x  DM II  Improving control P: - SSI sensitive scale - continue lantus 25 units q HS  L Great Toe Amputation  -WOC following   Leukocytosis Pt is afebrile, possibly reactive, no clear source  of infection; possible aspiration P: - given hypotension, feel that its acute on chronic, but for completeness, will send [facility]  Frequent Falls, Deconditioning  -PT consult -suspect he will need short term rehab before returning home based on recent hx of falls, multiple admits  Best practice (evaluated daily)  Diet: As tolerted  Pain/Anxiety/Delirium protocol (if indicated): n/a  VAP protocol (if indicated): n/a  DVT prophylaxis: Lovenox GI prophylaxis: per primary  Glucose control: SSI Mobility: As tolerated  last date of multidisciplinary goals of care discussion:  Family and staff present: pending  Summary of discussion: pending Follow up goals of care discussion due: 12/5  Code Status: Full Code  Disposition: ICU  Labs   CBC: Recent Labs  Lab 09/24/20 0248 09/24/20 0248 09/25/20 0353 09/07/2020 0356 09/26/2020 0923 09/28/20 0042 09/26/2020 0049  WBC 8.7  --  9.5 13.1*  --  18.9* 18.4*  NEUTROABS 6.3  --   --  10.0*  --   --   --   HGB 10.0*   < > 11.0* 10.9* 12.2* 10.5* 11.6*  HCT 29.7*   < > 33.1* 33.7* 36.0* 32.0* 35.2*  MCV 92.5  --  94.0 96.8  --   95.8 94.6  PLT 225  --  249 302  --  301 296   < > = values in this interval not displayed.    Basic Metabolic Panel: Recent Labs  Lab 09/24/20 0248 09/24/20 0248 09/25/20 0353 09/25/20 0353 09/25/20 1038 09/22/2020 0356 09/28/2020 0916 09/06/2020 0923 09/20/2020 1649 09/28/20 0042 09/01/2020 0049  NA 136   < > 135   < >  --  136  --  133* 136 136 134*  K 3.9   < > 4.5   < >  --  4.2  --  5.4* 4.4 4.8 4.5  CL 95*   < > 92*  --   --  90*  --   --  91* 91* 88*  CO2 25   < > 26  --   --  26  --   --  24 24 23   GLUCOSE 124*   < > 123*  --   --  190*  --   --  290* 263* 197*  BUN 18   < > 27*  --   --  16  --   --  19 22 31*  CREATININE 5.89*   < > 7.66*  --   --  6.40*  --   --  7.07* 7.41* 8.60*  CALCIUM 9.0   < > 9.9  --   --  10.0  --   --  10.1 10.1 10.4*  MG  --   --   --   --  2.5*  --  2.5*  --  2.5* 2.6* 2.7*  PHOS 4.1  --   --   --   --   --   --   --  6.1*  --   --    < > = values in this interval not displayed.   GFR: Estimated Creatinine Clearance: 9.6 mL/min (A) (by C-G formula based on SCr of 8.6 mg/dL (H)). Recent Labs  Lab 09/25/20 0353 09/24/2020 0356 09/28/20 0042 09/23/2020 0049  WBC 9.5 13.1* 18.9* 18.4*    Liver Function Tests: Recent Labs  Lab 09/23/20 1119 09/24/20 0248 08/31/2020 1649 09/28/20 0042  AST 58*  --  74* 71*  ALT 25  --  45* 50*  ALKPHOS 164*  --  158* 145*  BILITOT 1.3*  --  1.5* 1.3*  PROT 7.0  --  7.6 7.1  ALBUMIN 3.1* 2.8* 3.3* 3.2*   No results for input(s): LIPASE, AMYLASE in the last 168 hours. No results for input(s): AMMONIA in the last 168 hours.  ABG    Component Value Date/Time   HCO3 28.1 (H) 08/31/2020 0923   TCO2 30 09/17/2020 0923   O2SAT 76.0 09/17/2020 0923     Coagulation Profile: No results for input(s): INR, PROTIME in the last 168 hours.  Cardiac Enzymes: No results for input(s): CKTOTAL, CKMB, CKMBINDEX, TROPONINI in the last 168 hours.  HbA1C: Hgb A1c MFr Bld  Date/Time Value Ref Range Status   09/23/2020 11:19 AM 7.2 (H) 4.8 - 5.6 % Final    Comment:    (NOTE) Pre diabetes:          5.7%-6.4%  Diabetes:              >6.4%  Glycemic control for   <7.0% adults with diabetes     CBG: Recent Labs  Lab 09/28/20 1111 09/28/20 1503 09/28/20 2003 09/28/20 2333 09/03/2020 0324  GLUCAP 308* 276* 251* 368* 176*    CCT: 45 mins  Kennieth Rad, ACNP [facility] 09/21/2020, 10:51 AM  See Shea Evans for personal pager PCCM on call pager [phone]

## 2020-09-29 NOTE — CV Procedure (Signed)
Central Venous Catheter Insertion Procedure Note Connor Hoover [id number] [birth year]    Procedure: Insertion of Central Venous Catheter Indications: Hemodialysis  Assistants: Anselm Lis, NP & Kennieth Rad, NP   Procedure Details Consent: Risks of procedure as well as the alternatives and risks of each were explained to the (patient/caregiver).  Consent for procedure obtained. Time Out: Verified patient identification, verified procedure, site/side was marked, verified correct patient position, special equipment/implants available, medications/allergies/relevent history reviewed, required imaging and test results available.  Performed   Maximum sterile technique was used including antiseptics, cap, gloves, gown, hand hygiene, mask and sheet. Skin prep: Chlorhexidine; local anesthetic administered A antimicrobial bonded/coated triple lumen tracatheter was placed in the left femoral vein internal jugular vein using the Seldinger technique and u/s guidance. Femoral approach chosen due to stenosis of RIJ and non compressible LIJ.    Evaluation Blood flow good Complications: No apparent complications Patient did tolerate procedure well.    Connor Bickers, MD  8:14 PM

## 2020-09-29 NOTE — Progress Notes (Signed)
Pt refused CPAP for tonight.  

## 2020-09-29 NOTE — Plan of Care (Signed)

## 2020-09-29 NOTE — Interval H&P Note (Signed)
History and Physical Interval Note:  09/18/2020 7:51 AM  Connor Hoover  has presented today for surgery, with the diagnosis of heart failure.  The various methods of treatment have been discussed with the patient and family. After consideration of risks, benefits and other options for treatment, the patient has consented to  Procedure(s): RIGHT/LEFT HEART CATH AND CORONARY ANGIOGRAPHY (N/A) and possible coronary angioplasty as a surgical intervention.  The patient's history has been reviewed, patient examined, no change in status, stable for surgery.  I have reviewed the patient's chart and labs.  Questions were answered to the patient's satisfaction.     [name]

## 2020-09-29 NOTE — Progress Notes (Signed)
[facility] Progress Note   Subjective: "I feel OK". Has just returned from cath lab S/P Right and Left heart cath. LVEDP 35. Preparing to start IHD but issues with hypotension with low dose Levo. Will start CRRT. PCCM asked for temp cath placement. Procedure explained to patient and he agrees.   Objective Vitals:   09/12/2020 0700 09/22/2020 0751 09/26/2020 0830 09/22/2020 0945  BP: 103/73   (!) 126/95  Pulse: 72   73  Resp:    16  Temp:      TempSrc:      SpO2: 100% 100% 100% 100%  Weight:      Height:       Physical Exam General: Chronically ill appearing male in NAD Heart: HS distant, S1,S2 RRR No M. 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  Lungs: CTAB. No WOB.  Abdomen: Active BS Extremities: Trace BLE edema. Venous stasis disc Dialysis Access: L AVF + bruit   Additional Objective Labs: Basic Metabolic Panel: Recent Labs  Lab 09/24/20 0248 09/25/20 0353 09/22/2020 1649 09/28/20 0042 09/12/2020 0049  NA 136   < > 136 136 134*  K 3.9   < > 4.4 4.8 4.5  CL 95*   < > 91* 91* 88*  CO2 25   < > 24 24 23   GLUCOSE 124*   < > 290* 263* 197*  BUN 18   < > 19 22 31*  CREATININE 5.89*   < > 7.07* 7.41* 8.60*  CALCIUM 9.0   < > 10.1 10.1 10.4*  PHOS 4.1  --  6.1*  --   --    < > = values in this interval not displayed.   Liver Function Tests: Recent Labs  Lab 09/23/20 1119 09/23/20 1119 09/24/20 0248 09/20/2020 1649 09/28/20 0042  AST 58*  --   --  74* 71*  ALT 25  --   --  45* 50*  ALKPHOS 164*  --   --  158* 145*  BILITOT 1.3*  --   --  1.5* 1.3*  PROT 7.0  --   --  7.6 7.1  ALBUMIN 3.1*   < > 2.8* 3.3* 3.2*   < > = values in this interval not displayed.   No results for input(s): LIPASE, AMYLASE in the last 168 hours. CBC: Recent Labs  Lab 09/24/20 0248 09/24/20 0248 09/25/20 0353 09/25/20 0353 09/18/2020 0356 09/09/2020 0356 09/16/2020 0923 09/28/20 0042 09/20/2020 0049  WBC 8.7   < > 9.5   < > 13.1*  --   --  18.9* 18.4*  NEUTROABS 6.3  --   --   --  10.0*  --    --   --   --   HGB 10.0*   < > 11.0*   < > 10.9*   < > 12.2* 10.5* 11.6*  HCT 29.7*   < > 33.1*   < > 33.7*   < > 36.0* 32.0* 35.2*  MCV 92.5  --  94.0  --  96.8  --   --  95.8 94.6  PLT 225   < > 249   < > 302  --   --  301 296   < > = values in this interval not displayed.   Blood Culture    Component Value Date/Time   SDES URINE, CATHETERIZED 01/16/2020 1329   SPECREQUEST NONE 01/16/2020 1329   CULT  01/16/2020 1329    NO GROWTH Performed at [facility], [place]  [address], [place], [place] [address]    REPTSTATUS 01/17/2020 FINAL 01/16/2020 1329    Cardiac Enzymes: No results for input(s): CKTOTAL, CKMB, CKMBINDEX, TROPONINI in the last 168 hours. CBG: Recent Labs  Lab 09/28/20 1111 09/28/20 1503 09/28/20 2003 09/28/20 2333 09/20/2020 0324  GLUCAP 308* 276* 251* 368* 176*   Iron Studies: No results for input(s): IRON, TIBC, TRANSFERRIN, FERRITIN in the last 72 hours. @lablastinr3 @ Studies/Results: CARDIAC CATHETERIZATION  Result Date: 09/28/2020  Ost LM lesion is 100% stenosed.  Prox RCA to Mid RCA lesion is 100% stenosed.  1st Mrg lesion is 70% stenosed.  RIMA.  Prox RCA-1 lesion is 30% stenosed.  Previously placed Prox RCA-2 stent (unknown type) is widely patent.  Findings: Ao = 99/63 (77) LV = 100/35 RA =  32 RV = 60/26 PA = 66/23 (43) PCW = 35 Fick cardiac output/index = 4.7/2.1 PVR = 1.7 WU PAPi = 1.3 FA sat =100% PA sat = 57%, 60% Assessment: 1. LM: Totally occluded ostially (chronic) 2. RCA: Newly occluded in midsection (new since CABG 2018) 3. LIMA: Atretic 4. Free RIMA to LAD: WIdely patent. LAD is large vessel free of significant stenosis after graft insertion 5. SVG to OM1: Widely patent. Backfills a large portion of the LCX. There is a 70% lesion prior to graft insertion 6. Severe biventricular HF with markedly elevated filling pressures Plan/Discussion: Continue medical therapy. Will need HD today. [name], MD 9:10 AM   ECHOCARDIOGRAM  COMPLETE  Result Date: 09/19/2020    ECHOCARDIOGRAM REPORT   Patient Name:   Connor Hoover Date of Exam: 09/04/2020 Medical Rec #:  [id number]       Height:       66.5 in Accession #:    [id number]      Weight:       277.6 lb Date of Birth:  April 17, 1946        BSA:          2.312 m Patient Age:    74 years        BP:           100/62 mmHg Patient Gender: M               HR:           74 bpm. Exam Location:  Inpatient Procedure: 2D Echo, Cardiac Doppler, Color Doppler and Intracardiac            Opacification Agent Indications:    R06.02 SOB  History:        Patient has prior history of Echocardiogram examinations, most                 recent 11/16/2009. Arrythmias:Cardiac Arrest;                 Signs/Symptoms:Hypotension.  Sonographer:    Merrie Roof RDCS Referring Phys: [id number] Raynaldo Opitz RAINES IMPRESSIONS  1. No mural thrombus with Definity contrast. Left ventricular ejection fraction, by estimation, is <20%. The left ventricle has severely decreased function. The left ventricle demonstrates global hypokinesis. The left ventricular internal cavity size was moderately dilated. Left ventricular diastolic parameters are consistent with Grade II diastolic dysfunction (pseudonormalization). Elevated left ventricular end-diastolic pressure.  2. Right ventricular systolic function is mildly reduced. The right ventricular size is normal. There is normal pulmonary artery systolic pressure.  3. Left atrial size was moderately dilated.  4. Right atrial size was severely dilated.  5. ? may be some degree of MS - aliasing noted during  diastole in the LV suggesting restricted diastolic filling. The mitral valve was not well visualized. No evidence of mitral valve regurgitation. Moderate mitral annular calcification.  6. The aortic valve is tricuspid. Aortic valve regurgitation is not visualized.  7. Aortic dilatation noted. There is borderline dilatation of the ascending aorta, measuring 38 mm.  8. The inferior vena cava is  dilated in size with <50% respiratory variability, suggesting right atrial pressure of 15 mmHg. FINDINGS  Left Ventricle: No mural thrombus with Definity contrast. Left ventricular ejection fraction, by estimation, is <20%. The left ventricle has severely decreased function. The left ventricle demonstrates global hypokinesis. Definity contrast agent was given IV to delineate the left ventricular endocardial borders. The left ventricular internal cavity size was moderately dilated. There is no left ventricular hypertrophy. Left ventricular diastolic parameters are consistent with Grade II diastolic dysfunction (pseudonormalization). Elevated left ventricular end-diastolic pressure. Right Ventricle: The right ventricular size is normal. No increase in right ventricular wall thickness. Right ventricular systolic function is mildly reduced. There is normal pulmonary artery systolic pressure. The tricuspid regurgitant velocity is 1.86 m/s, and with an assumed right atrial pressure of 15 mmHg, the estimated right ventricular systolic pressure is 51.8 mmHg. Left Atrium: Left atrial size was moderately dilated. Right Atrium: Right atrial size was severely dilated. Pericardium: There is no evidence of pericardial effusion. Mitral Valve: ? may be some degree of MS - aliasing noted during diastole in the LV suggesting restricted diastolic filling. The mitral valve was not well visualized. Moderate mitral annular calcification. No evidence of mitral valve regurgitation. Tricuspid Valve: The tricuspid valve is grossly normal. Tricuspid valve regurgitation is mild. Aortic Valve: The aortic valve is tricuspid. Aortic valve regurgitation is not visualized. Pulmonic Valve: The pulmonic valve was grossly normal. Pulmonic valve regurgitation is trivial. Aorta: Aortic dilatation noted. There is borderline dilatation of the ascending aorta, measuring 38 mm. Venous: The inferior vena cava is dilated in size with less than 50% respiratory  variability, suggesting right atrial pressure of 15 mmHg. IAS/Shunts: The interatrial septum was not well visualized.  LEFT VENTRICLE PLAX 2D LVIDd:         6.20 cm      Diastology LVIDs:         5.50 cm      LV e' medial:    3.70 cm/s LV PW:         0.90 cm      LV E/e' medial:  24.9 LV IVS:        0.90 cm      LV e' lateral:   7.62 cm/s LVOT diam:     2.20 cm      LV E/e' lateral: 12.1 LV SV:         49 LV SV Index:   21 LVOT Area:     3.80 cm  LV Volumes (MOD) LV vol d, MOD A2C: 146.0 ml LV vol d, MOD A4C: 177.0 ml LV vol s, MOD A2C: 90.0 ml LV vol s, MOD A4C: 148.0 ml LV SV MOD A2C:     56.0 ml LV SV MOD A4C:     177.0 ml LV SV MOD BP:      46.2 ml RIGHT VENTRICLE          IVC RV Basal diam:  4.30 cm  IVC diam: 2.20 cm RV Mid diam:    4.10 cm TAPSE (M-mode): 1.3 cm LEFT ATRIUM             Index  RIGHT ATRIUM           Index LA diam:        4.40 cm 1.90 cm/m  RA Area:     31.80 cm LA Vol (A2C):   89.2 ml 38.59 ml/m RA Volume:   121.00 ml 52.35 ml/m LA Vol (A4C):   88.9 ml 38.46 ml/m LA Biplane Vol: 94.6 ml 40.92 ml/m  AORTIC VALVE LVOT Vmax:   68.50 cm/s LVOT Vmean:  42.600 cm/s LVOT VTI:    0.129 m  AORTA Ao Root diam: 3.20 cm Ao Asc diam:  3.80 cm MITRAL VALVE               TRICUSPID VALVE MV Area (PHT): 4.26 cm    TR Peak grad:   13.8 mmHg MV Decel Time: 178 msec    TR Vmax:        186.00 cm/s MV E velocity: 92.10 cm/s MV A velocity: 85.30 cm/s  SHUNTS MV E/A ratio:  1.08        Systemic VTI:  0.13 m                            Systemic Diam: 2.20 cm Lyman Bishop MD Electronically signed by Lyman Bishop MD Signature Date/Time: 09/19/2020/4:15:22 PM    Final    Medications: . sodium chloride    . sodium chloride    . sodium chloride    . amiodarone 30 mg/hr (09/18/2020 0600)  . norepinephrine (LEVOPHED) Adult infusion 2 mcg/min (09/16/2020 1030)   . ammonium lactate  1 application Topical Daily  . amoxicillin-clavulanate  1 tablet Oral Q12H  . [START ON 09/30/2020] aspirin  81 mg Oral Daily   . atorvastatin  40 mg Oral QHS  . chlorhexidine  15 mL Mouth Rinse BID  . Chlorhexidine Gluconate Cloth  6 each Topical Daily  . cinacalcet  30 mg Oral Q supper  . clopidogrel  75 mg Oral Daily  . [START ON 09/30/2020] enoxaparin (LOVENOX) injection  30 mg Subcutaneous Q24H  . fluticasone furoate-vilanterol  1 puff Inhalation Daily  . heparin  4,000 Units Dialysis Once in dialysis  . insulin aspart  0-9 Units Subcutaneous Q4H  . insulin glargine  25 Units Subcutaneous QHS  . lamoTRIgine  150 mg Oral Daily  . mouth rinse  15 mL Mouth Rinse q12n4p  . midodrine  10 mg Oral TID WC  . sevelamer carbonate  1,600 mg Oral TID WC  . sodium chloride flush  3 mL Intravenous Q12H  . sodium chloride flush  3 mL Intravenous Q12H  . sodium zirconium cyclosilicate  10 g Oral Once     Dialysis Orders: TTS at [facility] -- full HD yesterday 11/27 4:15hr, 450/800, EDW 116kg, 2K/2Ca, AVF,  -heparin 8000 units IV bolus TIW -Calcitriol 2.45mcg PO q HD -No ESA  Assessment/Plan: 1. VT arrest with quick ROSC:HxShort run NSVT duringlastadmit. Cardiology following, on amiodarone IV at 30 mg/hr.  2. Ischemic CM: Repeat ECHO 11/28 estimates EF < 20%. LV with global hypokinesis, G2DD. L & R heart cath today. LVEDP 35, severe BiV failure. Start CRRT and maximize volume removal as tolerated.  3. H/O prior CABG and Redo CABG (2017, 2018). Cardiology following.  4. ESRD: Usual TTS schedule. As noted above, changing to CRRT.  5. Hypotension/volume: BP low sided, takes midodrine pre-HD now. Midodrine increased this admit to 10 mg PO TID. Started on low dose Levo-attempting to keep SBP >90.  6. Anemia: Hgb 11.6 - no ESA needed. 7. Metabolic bone disease: C Ca 11, Phos climbing, not eating. Continue binders when able to eat. - hold PO VDRA for now. 8. Nutrition: Albumin on low side, PO intake impeded by nausea. On renal diet fld restrictions.  9. DM- Per primary 10. Accidental OD on Dilaudid-Patient is alert &  oriented. Per Primary.   Oriel Ojo H. Ranger Petrich NP-C 09/23/2020, 10:36 AM  [employer] [phone]

## 2020-09-29 NOTE — Progress Notes (Signed)
[facility] Progress Note Patient Name: Tevin Shillingford DOB: Sep 11, 1946 MRN: [id number]   Date of Service  09/15/2020  HPI/Events of Note  Hypotension - Request to make Norepinephrine IV infusion quad strength.   eICU Interventions  Will make Norepinephrine IV infusion quad strength.      Intervention Category Major Interventions: Hypotension - evaluation and management  Lysle Dingwall 09/10/2020, 9:23 PM

## 2020-09-29 NOTE — Consult Note (Addendum)
Cardiology Consultation:   Patient ID: Connor Hoover MRN: [id number]; DOB: 1946/03/16  Admit date: 09/05/2020 Date of Consult: 09/11/2020  Primary Care Provider: [place] Cardiologist: [place] Electrophysiologist:  None    Patient Profile:   Connor Hoover is a 74 y.o. 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 Had emergent PCI of totally-occluded RCA. Then had staged PCI of m LAD. Subsequently CABG at [facility] in 2017. In 2018 had another MI and underwent re-do CABG at [facility]), ICM, chronic CHf (systolic), restrictive lung disease who is being seen today for the evaluation of poss ICD, VT arrest, ICM at the request of Dr. Margaretann Hoover.  History of Present Illness:   Connor Hoover was recently admitted in mid November at [facility] due to osteomyelitis of the left toe.  He underwent amputation of the left great toe and had a wound VAC placed.  Echocardiogram obtained during the admission showed worsening ejection fraction.  Per wife's report, EF went down to 25%.  He was eventually discharged on metoprolol succinate 25 mg daily, Plavix and midodrine 10 mg 3 times weekly in the morning of dialysis session.  Patient was readmitted to [facility] from 11/24-11/26 due to acute on chronic systolic heart failure and weakness after missing hemodialysis session.  During the hospitalization, note indicated he had an episode of nonsustained VT.  Unfortunately his metoprolol succinate had to be discontinued secondary to hypotension with systolic blood pressure in the 90s.  Patient was eventually discharged home 2 days ago on 09/25/2020.  He underwent hemodialysis on 11/27 and he took midodrine prior to the hemodialysis session.  Due to persistent peripheral pain, patient took Dilaudid.  Shortly after, he became unresponsive and had significant altered mental status.  EMS was called.  On arrival, he  reportedly had pinpoint pupils and was given a Narcan with improvement of his mental status.  [facility] was brought [facility] for further observation.  While talking to his wife in the ED, patient had a 2-minute onset of VT arrest.  CPR was immediately started and he was given a dose of epinephrine.  He quickly achieved ROSC  Was felt his initial issue was an accidental overdose of dilauded, and subsequently in the ER with VT arrest, was started on amiodarone gtt, lytes were OK, baseline hypotension precluded BB  Underwent cath today Assessment: 1. LM: Totally occluded ostially (chronic) 2. RCA: Newly occluded in midsection (new since CABG 2018) 3. LIMA: Atretic 4. Free RIMA to LAD: WIdely patent. LAD is large vessel free of significant stenosis after graft insertion 5. SVG to OM1: Widely patent. Backfills a large portion of the LCX. There is a 70% lesion prior to graft insertion 6. Severe biventricular HF with markedly elevated filling pressures Plan/Discussion: Continue medical therapy. Will need HD today.   EP is asked to see 2/2 his VT arrest and CM   LABS K+ 4.5 Mag 2.7 BUN/Creat 34/9.02 WBC 18.4 H/H 11/35 Plts 296   The patient denies any CP, SOB currently  Past Medical History:  Diagnosis Date   Cardiomyopathy ([place])    Diabetes mellitus without complication ([place])    ESRD (end stage renal disease) ([place])    Hemodialysis T, Th, Sat   Hypertension    Osteomyelitis ([place])    PTSD (post-traumatic stress disorder)     Past Surgical History:  Procedure Laterality Date   AMPUTATION  Great toe amputation.     CARDIAC SURGERY     CABG and redo     Home Medications:  Prior to Admission medications   Medication Sig Start Date End Date Taking? Authorizing Provider  acetaminophen (TYLENOL) 325 MG tablet Take 650 mg by mouth every 6 (six) hours as needed for mild pain or fever.   Yes [provider]  albuterol (VENTOLIN HFA) 108 (90 Base) MCG/ACT inhaler  Inhale 2 puffs into the lungs every 6 (six) hours as needed for wheezing or shortness of breath.   Yes [provider]  ammonium lactate (LAC-HYDRIN) 12 % lotion Apply 1 application topically daily.   Yes [provider]  aspirin 81 MG chewable tablet Chew 81 mg by mouth daily.   Yes [provider]  atorvastatin (LIPITOR) 80 MG tablet Take 40 mg by mouth at bedtime.    Yes [provider]  B Complex-C-Folic Acid (B COMPLEX-VITAMIN C-FOLIC ACID) 1 MG tablet Take 1 tablet by mouth daily. 10/17/17  Yes [provider]  bacitracin 500 UNIT/GM ointment Apply 1 application topically See admin instructions. Apply small amount topically every day for the first week. Only apply after cleaning and before applying gauze dressing. Discontinue after first week.   Yes [provider]  cinacalcet (SENSIPAR) 30 MG tablet Take 30 mg by mouth daily. With largest meal   Yes [provider]  clopidogrel (PLAVIX) 75 MG tablet Take 75 mg by mouth daily.   Yes [provider]  clotrimazole (LOTRIMIN) 1 % cream Apply 1 application topically daily.   Yes [provider]  docusate sodium (COLACE) 50 MG capsule Take 50 mg by mouth 2 (two) times daily.   Yes [provider]  Fluticasone-Salmeterol (ADVAIR) 100-50 MCG/DOSE AEPB Inhale 1 puff into the lungs 2 (two) times daily.   Yes [provider]  hydrocerin (EUCERIN) CREA Apply 1 application topically 2 (two) times daily as needed (itching).    Yes [provider]  HYDROmorphone (DILAUDID) 2 MG tablet Take 2 mg by mouth every 6 (six) hours as needed for moderate pain or severe pain.   Yes [provider]  insulin glargine (LANTUS) 100 UNIT/ML injection Inject 0.26 mLs (26 Units total) into the skin at bedtime. 09/25/20  Yes Aslam, Loralyn Freshwater, MD  insulin NPH-regular Human (70-30) 100 UNIT/ML injection Inject 4 Units into the skin in the morning, at noon, and at  bedtime.   Yes [provider]  ipratropium-albuterol (DUONEB) 0.5-2.5 (3) MG/3ML SOLN Take 3 mLs by nebulization in the morning and at bedtime.   Yes [provider]  lamoTRIgine (LAMICTAL) 150 MG tablet Take 150 mg by mouth daily.   Yes [provider]  lidocaine (LMX) 4 % cream Apply 1 application topically as needed. Patient taking differently: Apply 1 application topically 2 (two) times daily as needed (dry skin).  09/15/19  Yes Joy, Shawn C, PA-C  midodrine (PROAMATINE) 10 MG tablet Take 1 pill with food before dialysis on Tuesday, Thursday and Saturday. 09/25/20  Yes Aslam, Loralyn Freshwater, MD  Multiple Vitamin (RENAL MULTIVITAMIN/ZINC PO) Take 1 tablet by mouth daily with supper.   Yes [provider]  sevelamer carbonate (RENVELA) 800 MG tablet Take 1,600 mg by mouth 3 (three) times daily with meals.   Yes [provider]  triamcinolone (KENALOG) 0.1 % Apply 1 application topically 2 (two) times daily.   Yes [provider]    Inpatient Medications: Scheduled Meds:  ammonium lactate  1  application Topical Daily   amoxicillin-clavulanate  1 tablet Oral Q12H   [START ON 09/30/2020] aspirin  81 mg Oral Daily   atorvastatin  40 mg Oral QHS   chlorhexidine  15 mL Mouth Rinse BID   Chlorhexidine Gluconate Cloth  6 each Topical Daily   cinacalcet  30 mg Oral Q supper   clopidogrel  75 mg Oral Daily   [START ON 09/30/2020] enoxaparin (LOVENOX) injection  30 mg Subcutaneous Q24H   fluticasone furoate-vilanterol  1 puff Inhalation Daily   heparin  4,000 Units Dialysis Once in dialysis   insulin aspart  0-9 Units Subcutaneous Q4H   insulin glargine  25 Units Subcutaneous QHS   lamoTRIgine  150 mg Oral Daily   mouth rinse  15 mL Mouth Rinse q12n4p   midodrine  10 mg Oral TID WC   sevelamer carbonate  1,600 mg Oral TID WC   sodium chloride flush  3 mL Intravenous Q12H   sodium chloride flush  3 mL Intravenous Q12H   sodium  zirconium cyclosilicate  10 g Oral Once   Continuous Infusions:   prismasol BGK 4/2.5      prismasol BGK 4/2.5     sodium chloride     sodium chloride     sodium chloride     sodium chloride     amiodarone 30 mg/hr (09/28/2020 0600)   norepinephrine (LEVOPHED) Adult infusion     prismasol BGK 4/2.5     PRN Meds: sodium chloride, sodium chloride, sodium chloride, acetaminophen, albuterol, alteplase, heparin, heparin, hydrALAZINE, hydrocerin, ipratropium-albuterol, labetalol, lidocaine (PF), lidocaine-prilocaine, ondansetron (ZOFRAN) IV, pentafluoroprop-tetrafluoroeth, sodium chloride flush  Allergies:    Allergies  Allergen Reactions   Lisinopril Swelling    Made the tongue swell and resulted in a trip to the ER   Povidine [Povidone Iodine] Itching    Social History:   Social History   Socioeconomic History   Marital status: Legally Separated    Spouse name: Not on file   Number of children: Not on file   Years of education: Not on file   Highest education level: Not on file  Occupational History   Not on file  Tobacco Use   Smoking status: Former Smoker   Smokeless tobacco: Former Counsellor Use: Never used  Substance and Sexual Activity   Alcohol use: Not Currently   Drug use: Not Currently   Sexual activity: Not on file  Other Topics Concern   Not on file  Social History Narrative   Not on file   Social Determinants of Health   Financial Resource Strain:    Difficulty of Paying Living Expenses: Not on file  Food Insecurity:    Worried About Charity fundraiser in the Last Year: Not on file   [place] in the Last Year: Not on file  Transportation Needs:    Lack of Transportation (Medical): Not on file   Lack of Transportation (Non-Medical): Not on file  Physical Activity:    Days of Exercise per Week: Not on file   Minutes of Exercise per Session: Not on file  Stress:    Feeling of Stress : Not on file   Social Connections:    Frequency of Communication with Friends and Family: Not on file   Frequency of Social Gatherings with Friends and Family: Not on file   Attends Religious Services: Not on file   Active Member of Clubs or Organizations: Not on file  Attends Archivist Meetings: Not on file   Marital Status: Not on file  Intimate Partner Violence:    Fear of Current or Ex-Partner: Not on file   Emotionally Abused: Not on file   Physically Abused: Not on file   Sexually Abused: Not on file    Family History:   Father had CAD  ROS:  Please see the history of present illness.   All other ROS reviewed and negative.     Physical Exam/Data:   Vitals:   09/17/2020 1105 09/07/2020 1115 09/20/2020 1120 09/22/2020 1130  BP:  (!) 58/30 (!) 89/72 (!) 55/27  Pulse:  67 68 66  Resp:  20 19 17   Temp: (!) 97.5 F (36.4 C)     TempSrc: Axillary     SpO2:  100% 100% 100%  Weight:      Height:        Intake/Output Summary (Last 24 hours) at 09/15/2020 1139 Last data filed at 09/13/2020 1100 Gross per 24 hour  Intake 430.59 ml  Output 550 ml  Net -119.41 ml   Last 3 Weights 09/15/2020 09/28/2020 09/21/2020  Weight (lbs) 267 lb 10.2 oz 266 lb 12.1 oz 263 lb 10.7 oz  Weight (kg) 121.4 kg 121 kg 119.6 kg     Body mass index is 42.56 kg/m.  General:  Well nourished, well developed, in no acute distress HEENT: normal Lymph: no adenopathy Neck: no JVD Endocrine:  No thryomegaly Vascular: No carotid bruits Cardiac: RRR; soft SM, no gallops or rubs Lungs: diminished at the bases, soft crackles, no wheezing, rhonchi or rales  Abd: soft, nontender, obese  Ext:  1-2+ edema, chronic skin changes, dressing/wound vac L foot Musculoskeletal:  No deformities Skin: warm and dry  Neuro: no focal abnormalities noted Psych:  Normal affect   EKG:  The EKG was personally reviewed and demonstrates:    SR 77bpm, 1st degree AVbock, RBBB, LAD #2 is faster rate, 112bpm, RBBB,  LAD, suspect ST 09/06/2020 @ 0620 ST 107bpm, RBBB, LAD Last is SR 90bpm, 1st degree AVblock, LAD   Telemetry:  Telemetry was personally reviewed and demonstrates:    SR 70's generally., 1st degree AVblock   Relevant CV Studies:  09/12/2020; R/LHC  Ost LM lesion is 100% stenosed.  Prox RCA to Mid RCA lesion is 100% stenosed.  1st Mrg lesion is 70% stenosed.  RIMA.  Prox RCA-1 lesion is 30% stenosed.  Previously placed Prox RCA-2 stent (unknown type) is widely patent.  Findings:  Ao = 99/63 (77) LV = 100/35 RA =  32 RV = 60/26 PA = 66/23 (43) PCW = 35 Fick cardiac output/index = 4.7/2.1 PVR = 1.7 WU PAPi = 1.3 FA sat =100% PA sat = 57%, 60%  Assessment: 1. LM: Totally occluded ostially (chronic) 2. RCA: Newly occluded in midsection (new since CABG 2018) 3. LIMA: Atretic 4. Free RIMA to LAD: WIdely patent. LAD is large vessel free of significant stenosis after graft insertion 5. SVG to OM1: Widely patent. Backfills a large portion of the LCX. There is a 70% lesion prior to graft insertion 6. Severe biventricular HF with markedly elevated filling pressures  Plan/Discussion: Continue medical therapy. Will need HD today.     09/28/2020: TTE IMPRESSIONS  1. No mural thrombus with Definity contrast. Left ventricular ejection  fraction, by estimation, is <20%. The left ventricle has severely  decreased function. The left ventricle demonstrates global hypokinesis.  The left ventricular internal cavity size  was moderately dilated. Left  ventricular diastolic parameters are  consistent with Grade II diastolic dysfunction (pseudonormalization).  Elevated left ventricular end-diastolic pressure.  2. Right ventricular systolic function is mildly reduced. The right  ventricular size is normal. There is normal pulmonary artery systolic  pressure.  3. Left atrial size was moderately dilated.  4. Right atrial size was severely dilated.  5. ? may be some degree of  MS - aliasing noted during diastole in the LV  suggesting restricted diastolic filling. The mitral valve was not well  visualized. No evidence of mitral valve regurgitation. Moderate mitral  annular calcification.  6. The aortic valve is tricuspid. Aortic valve regurgitation is not  visualized.  7. Aortic dilatation noted. There is borderline dilatation of the  ascending aorta, measuring 38 mm.  8. The inferior vena cava is dilated in size with <50% respiratory  variability, suggesting right atrial pressure of 15 mmHg.     LHC 05/17/17: HEMODYNAMICS: Aortic pressure was 136/72 mmHg with mean 96 mmHg.   ANGIOGRAPHIC DATA: 1. Left main coronary artery: Large caliber vessel, distal LM has 90 % severe stenosis.   2. Left anterior descending coronary artery (LAD): large caliber vessel, has severe proxima; disease, gives 2 Diagonal branch vessels.   3. Left circumflex coronary artery: Large caliber vessel, has ostial disease extending from distal LM, it gives 2 large OM branch vessels.   4. Right coronary artery (RCA): Larg caliber vessel, dominant system. Gives PDA and PLV. Has luminal irregularities.   5. LIMA Graft: the graft is patent, however unclear which vessel it is grafted to. It appears to be grafted to diagonal branch vessel.   6. SVG Graft: It has flush occlusion ( presumed to be grafted to OM )   Cardiac surgery or interventional data: 80% LEFT MAIN STENOSIS, 90% PROXIMAL OSTIAL LAD LESION WITH 50% PROXIMAL OSTIAL CIRCUMFLEX LESION    Echo 05/17/17: The left ventricle is moderately dilated. The LV ejection fraction is severely decreased. Ejection Fraction = 30-35%. There is severe global hypokinesis of the left ventricle. Tissue Doppler pattern suggests abnormal relaxation.  Laboratory Data:  High Sensitivity Troponin:   Recent Labs  Lab 09/08/2020 1649 09/11/2020 1828  TROPONINIHS 98* 91*     Chemistry Recent Labs  Lab 09/06/2020 1649 09/28/20 0042  08/31/2020 0049  NA 136 136 134*  K 4.4 4.8 4.5  CL 91* 91* 88*  CO2 24 24 23   GLUCOSE 290* 263* 197*  BUN 19 22 31*  CREATININE 7.07* 7.41* 8.60*  CALCIUM 10.1 10.1 10.4*  GFRNONAA 8* 7* 6*  ANIONGAP 21* 21* 23*    Recent Labs  Lab 09/23/20 1119 09/23/20 1119 09/24/20 0248 09/22/2020 1649 09/28/20 0042  PROT 7.0  --   --  7.6 7.1  ALBUMIN 3.1*   < > 2.8* 3.3* 3.2*  AST 58*  --   --  74* 71*  ALT 25  --   --  45* 50*  ALKPHOS 164*  --   --  158* 145*  BILITOT 1.3*  --   --  1.5* 1.3*   < > = values in this interval not displayed.   Hematology Recent Labs  Lab 09/28/2020 0356 09/14/2020 0356 09/13/2020 0923 09/28/20 0042 09/19/2020 0049  WBC 13.1*  --   --  18.9* 18.4*  RBC 3.48*  --   --  3.34* 3.72*  HGB 10.9*   < > 12.2* 10.5* 11.6*  HCT 33.7*   < > 36.0* 32.0* 35.2*  MCV 96.8  --   --  95.8 94.6  MCH 31.3  --   --  31.4 31.2  MCHC 32.3  --   --  32.8 33.0  RDW 16.8*  --   --  16.7* 17.3*  PLT 302  --   --  301 296   < > = values in this interval not displayed.   BNP Recent Labs  Lab 09/05/2020 0356  BNP 1,039.7*    DDimer No results for input(s): DDIMER in the last 168 hours.   Radiology/Studies:  DG Chest Port 1 View Result Date: 09/20/2020 CLINICAL DATA:  Shortness of breath EXAM: PORTABLE CHEST 1 VIEW COMPARISON:  09/03/2020 FINDINGS: Postoperative changes in the mediastinum. Cardiac enlargement. Improving vascular congestion. Atelectasis in the left lung base with possible small left pleural effusion. No progression. IMPRESSION: Cardiac enlargement with improving vascular congestion. Atelectasis in the left base with possible small left pleural effusion, unchanged. Electronically Signed   By: Lucienne Capers M.D.   On: 09/15/2020 06:34     Assessment and Plan:   1. VT arrest     Brief CPR, dose epi in the ER 2. CAD     New RCA occlusion, medical management recommended 3. ICM     Severe bive failure  4. Hypotension     On levophed (seems started to  allow/help with dialysis), proamatine   He has dressing on L foot with wound vac since his amputation a few weeks ago His wife states no antibiotics were prescribed I am unable to find his paperwork that she had brought the other day from the [place] [facility] describes :Wound type: Left anterior foot with full thickness wound  DM poorly hgb A1c 7.2 He is getting Aumentin currently. Unclear why, started yesterday  The patient reports and his wife confirms that his cardiologist at the [place] did not think he was a candidate for a defibrillator at his last visit while he was there for his foot a few weeks ago, stating that his heart was just too weak.    His infection risk for ICD is extremely high  His wife (of 1.5 years) mentions that she thinks he has early dementia having worked in a SNF and notes his memory is failing.  Sometime mentions to her he would like to go be with his family that have passed  He is being started on CRRT today in d/w RN  Dr. Rayann Heman will see the patient later today  Agree with amiodarone       For questions or updates, please contact [facility] Please consult [email or web address] for contact info under    Signed, Baldwin Jamaica, PA-C  09/07/2020 11:39 AM    I have seen, examined the patient, and reviewed the above assessment and plan.  Changes to above are made where necessary.  On exam, ill appearing, RRR.   The patient is clinically very ill.  He has class IV heart failure and ESRD.  He  Is markedly volume overloaded and hypotensive, limiting ability to remove volume. He has recent foot wounds with recent wound vac placement.  His prognosis is very poor.  He is currently not a candidate for an ICD.  I had a very long discussion with the patient and his wife.  They are quite clear that they would also like to avoid ICD implantation at this time.  Continue amiodarone.  Ok to transition to PO in 24 hours if no further arrhythmias. It appear that VT  occurred in the setting of respiratory failure  and accidental dilaudid overdose.   Co Sign: Thompson Grayer, MD 09/14/2020 10:54 PM

## 2020-09-30 DIAGNOSIS — I959 Hypotension, unspecified: Secondary | ICD-10-CM

## 2020-09-30 DIAGNOSIS — J441 Chronic obstructive pulmonary disease with (acute) exacerbation: Secondary | ICD-10-CM | POA: Diagnosis not present

## 2020-09-30 DIAGNOSIS — I5023 Acute on chronic systolic (congestive) heart failure: Secondary | ICD-10-CM

## 2020-09-30 DIAGNOSIS — E872 Acidosis: Secondary | ICD-10-CM

## 2020-09-30 DIAGNOSIS — I509 Heart failure, unspecified: Secondary | ICD-10-CM | POA: Diagnosis not present

## 2020-09-30 DIAGNOSIS — Z515 Encounter for palliative care: Secondary | ICD-10-CM | POA: Diagnosis not present

## 2020-09-30 DIAGNOSIS — Z7189 Other specified counseling: Secondary | ICD-10-CM

## 2020-09-30 DIAGNOSIS — R570 Cardiogenic shock: Secondary | ICD-10-CM

## 2020-09-30 DIAGNOSIS — N186 End stage renal disease: Secondary | ICD-10-CM | POA: Diagnosis not present

## 2020-09-30 LAB — RENAL FUNCTION PANEL
Albumin: 3.5 g/dL (ref 3.5–5.0)
Albumin: 3.3 g/dL — ABNORMAL LOW (ref 3.5–5.0)
Anion gap: 28 — ABNORMAL HIGH (ref 5–15)
Anion gap: 23 — ABNORMAL HIGH (ref 5–15)
BUN: 30 mg/dL — ABNORMAL HIGH (ref 8–23)
BUN: 18 mg/dL (ref 8–23)
CO2: 17 mmol/L — ABNORMAL LOW (ref 22–32)
CO2: 17 mmol/L — ABNORMAL LOW (ref 22–32)
Calcium: 10.1 mg/dL (ref 8.9–10.3)
Calcium: 9.6 mg/dL (ref 8.9–10.3)
Chloride: 90 mmol/L — ABNORMAL LOW (ref 98–111)
Chloride: 93 mmol/L — ABNORMAL LOW (ref 98–111)
Creatinine, Ser: 7.48 mg/dL — ABNORMAL HIGH (ref 0.61–1.24)
Creatinine, Ser: 4.17 mg/dL — ABNORMAL HIGH (ref 0.61–1.24)
GFR, Estimated: 7 mL/min — ABNORMAL LOW (ref >60)
GFR, Estimated: 14 mL/min — ABNORMAL LOW (ref >60)
Glucose, Bld: 67 mg/dL — ABNORMAL LOW (ref 70–99)
Glucose, Bld: 124 mg/dL — ABNORMAL HIGH (ref 70–99)
Phosphorus: 6.2 mg/dL — ABNORMAL HIGH (ref 2.5–4.6)
Phosphorus: 5 mg/dL — ABNORMAL HIGH (ref 2.5–4.6)
Potassium: 4.8 mmol/L (ref 3.5–5.1)
Potassium: 5.1 mmol/L (ref 3.5–5.1)
Sodium: 135 mmol/L (ref 135–145)
Sodium: 133 mmol/L — ABNORMAL LOW (ref 135–145)

## 2020-09-30 LAB — GLUCOSE, CAPILLARY
Glucose-Capillary: 46 mg/dL — ABNORMAL LOW (ref 70–99)
Glucose-Capillary: 60 mg/dL — ABNORMAL LOW (ref 70–99)
Glucose-Capillary: 62 mg/dL — ABNORMAL LOW (ref 70–99)
Glucose-Capillary: 63 mg/dL — ABNORMAL LOW (ref 70–99)
Glucose-Capillary: 71 mg/dL (ref 70–99)
Glucose-Capillary: 73 mg/dL (ref 70–99)
Glucose-Capillary: 82 mg/dL (ref 70–99)
Glucose-Capillary: 83 mg/dL (ref 70–99)
Glucose-Capillary: 85 mg/dL (ref 70–99)
Glucose-Capillary: 90 mg/dL (ref 70–99)

## 2020-09-30 LAB — LACTIC ACID, PLASMA
Lactic Acid, Venous: 6.8 mmol/L (ref 0.5–1.9)
Lactic Acid, Venous: 8.3 mmol/L (ref 0.5–1.9)

## 2020-09-30 LAB — CBC
HCT: 34.4 % — ABNORMAL LOW (ref 39.0–52.0)
Hemoglobin: 11.3 g/dL — ABNORMAL LOW (ref 13.0–17.0)
MCH: 31.4 pg (ref 26.0–34.0)
MCHC: 32.8 g/dL (ref 30.0–36.0)
MCV: 95.6 fL (ref 80.0–100.0)
Platelets: 254 10*3/uL (ref 150–400)
RBC: 3.6 MIL/uL — ABNORMAL LOW (ref 4.22–5.81)
RDW: 18.6 % — ABNORMAL HIGH (ref 11.5–15.5)
WBC: 29 10*3/uL — ABNORMAL HIGH (ref 4.0–10.5)
nRBC: 1.3 % — ABNORMAL HIGH (ref 0.0–0.2)

## 2020-09-30 LAB — POCT ACTIVATED CLOTTING TIME
Activated Clotting Time: 148 seconds
Activated Clotting Time: 154 seconds
Activated Clotting Time: 148 seconds
Activated Clotting Time: 166 seconds
Activated Clotting Time: 178 seconds
Activated Clotting Time: 172 seconds
Activated Clotting Time: 178 seconds
Activated Clotting Time: 178 seconds
Activated Clotting Time: 184 seconds
Activated Clotting Time: 184 seconds
Activated Clotting Time: 184 seconds

## 2020-09-30 LAB — MAGNESIUM: Magnesium: 2.9 mg/dL — ABNORMAL HIGH (ref 1.7–2.4)

## 2020-09-30 MED ORDER — DEXTROSE 50 % IV SOLN: 12.5000 g | INTRAVENOUS | Status: AC

## 2020-09-30 MED ORDER — DEXTROSE 50 % IV SOLN
INTRAVENOUS | Status: AC
  Administered 2020-09-30: 12.5 g via INTRAVENOUS
  Filled 2020-09-30: qty 50

## 2020-09-30 MED ORDER — RENA-VITE PO TABS
1.0000 | ORAL_TABLET | Freq: Every day | ORAL | Status: DC
  Administered 2020-09-30: 1 via ORAL
  Filled 2020-09-30: qty 1

## 2020-09-30 MED ORDER — DEXTROSE 10 % IV SOLN: INTRAVENOUS | Status: DC

## 2020-09-30 MED ORDER — PROSOURCE PLUS PO LIQD
30.0000 mL | Freq: Two times a day (BID) | ORAL | Status: DC
  Administered 2020-09-30: 30 mL via ORAL
  Filled 2020-09-30: qty 30

## 2020-09-30 MED ORDER — INSULIN ASPART 100 UNIT/ML ~~LOC~~ SOLN: 0.0000 [IU] | SUBCUTANEOUS | Status: DC

## 2020-09-30 MED ORDER — HEPARIN BOLUS VIA INFUSION (CRRT)
1000.0000 [IU] | INTRAVENOUS | Status: DC | PRN
  Filled 2020-09-30: qty 1000

## 2020-09-30 MED ORDER — SODIUM CHLORIDE 0.9 % IV SOLN
250.0000 [IU]/h | INTRAVENOUS | Status: DC
  Administered 2020-09-30: 1050 [IU]/h via INTRAVENOUS_CENTRAL
  Administered 2020-09-30: 1000 [IU] via INTRAVENOUS_CENTRAL
  Administered 2020-09-30: 250 [IU]/h via INTRAVENOUS_CENTRAL
  Administered 2020-09-30: 1000 [IU] via INTRAVENOUS_CENTRAL
  Administered 2020-10-01: 1150 [IU]/h via INTRAVENOUS_CENTRAL
  Filled 2020-09-30 (×4): qty 2

## 2020-09-30 MED ORDER — INSULIN GLARGINE 100 UNIT/ML ~~LOC~~ SOLN
15.0000 [IU] | Freq: Every day | SUBCUTANEOUS | Status: DC
  Filled 2020-09-30: qty 0.15

## 2020-09-30 MED ORDER — VANCOMYCIN HCL 1250 MG/250ML IV SOLN
1250.0000 mg | INTRAVENOUS | Status: DC
  Administered 2020-09-30: 1250 mg via INTRAVENOUS
  Filled 2020-09-30 (×2): qty 250

## 2020-09-30 MED ORDER — HEPARIN SODIUM (PORCINE) 5000 UNIT/ML IJ SOLN
5000.0000 [IU] | Freq: Three times a day (TID) | INTRAMUSCULAR | Status: DC
  Filled 2020-09-30: qty 1

## 2020-09-30 MED ORDER — HEPARIN SODIUM (PORCINE) 5000 UNIT/ML IJ SOLN
5000.0000 [IU] | Freq: Three times a day (TID) | INTRAMUSCULAR | Status: DC
  Administered 2020-10-01: 5000 [IU] via SUBCUTANEOUS
  Filled 2020-09-30: qty 1

## 2020-09-30 MED ORDER — DEXTROSE 50 % IV SOLN
INTRAVENOUS | Status: AC
  Administered 2020-09-30: 25 g via INTRAVENOUS
  Filled 2020-09-30: qty 50

## 2020-09-30 MED ORDER — PIPERACILLIN-TAZOBACTAM 3.375 G IVPB 30 MIN
3.3750 g | Freq: Four times a day (QID) | INTRAVENOUS | Status: DC
  Administered 2020-09-30 – 2020-10-01 (×3): 3.375 g via INTRAVENOUS
  Filled 2020-09-30 (×7): qty 50

## 2020-09-30 MED ORDER — PROSOURCE TF PO LIQD: 45.0000 mL | Freq: Two times a day (BID) | ORAL | Status: DC

## 2020-09-30 MED ORDER — DEXTROSE 50 % IV SOLN: 25.0000 g | INTRAVENOUS | Status: AC

## 2020-09-30 NOTE — Progress Notes (Signed)
Pharmacy Antibiotic Note  Connor Hoover is a 74 y.o. male with concern for HCAP  On CRRT  Currently on augmentin  Height: 5' 6.5" (168.9 cm) Weight: 117.9 kg (259 lb 14.8 oz) IBW/kg (Calculated) : 64.94  Temp (24hrs), Avg:96.9 F (36.1 C), Min:96.3 F (35.7 C), Max:97.7 F (36.5 C)  Recent Labs  Lab 09/11/2020 0356 09/22/2020 0356 09/14/2020 1649 09/28/20 0042 09/14/2020 0049 09/08/2020 1449 09/30/20 0059 09/30/20 1422  WBC 13.1*  --   --  18.9* 18.4* 14.4*  --  29.0*  CREATININE 6.40*   < > 7.07* 7.41* 8.60* 9.02* 7.48*  --   LATICACIDVEN  --   --   --   --   --   --   --  6.8*   < > = values in this interval not displayed.    Estimated Creatinine Clearance: 10.9 mL/min (A) (by C-G formula based on SCr of 7.48 mg/dL (H)).    Allergies  Allergen Reactions  . Lisinopril Swelling    Made the tongue swell and resulted in a trip to the ER  . Povidine [Povidone Iodine] Itching   Barth Kirks, PharmD, BCPS, BCCCP Clinical Pharmacist [phone]  Please check AMION for all [facility] numbers  09/30/2020 5:21 PM

## 2020-09-30 NOTE — Progress Notes (Signed)
   09/30/20 1300  Clinical Encounter Type  Visited With Patient and family together;Health care provider  Visit Type Initial  Referral From Nurse  Consult/Referral To Chaplain  Spiritual Encounters  Spiritual Needs Literature  The chaplain facilitated the AD notary process for the patient. A copy was given to the patient. The chaplain will follow up as needed.

## 2020-09-30 NOTE — Progress Notes (Signed)
[facility] Progress Note Patient Name: Connor Hoover DOB: 01-12-1946 MRN: [id number]   Date of Service  09/30/2020  HPI/Events of Note  Hypoglycemia X 2 - Blood glucose = 46 and 63. Patient in on Lantus 25 units [place]  at bedtime.   eICU Interventions  Plan: 1. Decrease Lantus to 15 units [place] at bedtime.  2. D10 W to run IV at 40 mL/hour.      Intervention Category Major Interventions: Other:  Maniyah Moller Cornelia Copa 09/30/2020, 5:59 AM

## 2020-09-30 NOTE — Progress Notes (Addendum)
Advanced Heart Failure Rounding Note   Subjective:    Remains on CVVHD. Negative 300 cc. Weight down about 2 pounds. Remains on NE 7 for BP support.   Blood sugar low this am. Insulin decreased.    Objective:   Weight Range:  Vital Signs:   Temp:  [97.3 F (36.3 C)-97.7 F (36.5 C)] 97.3 F (36.3 C) (12/01 0640) Pulse Rate:  [66-122] 66 (11/30 1745) Resp:  [7-42] 23 (12/01 0700) BP: (47-206)/(0-183) 94/45 (12/01 0700) SpO2:  [98 %-100 %] 100 % (12/01 0500) Weight:  [117.9 kg] 117.9 kg (12/01 0500) Last BM Date: 09/24/20  Weight change: Filed Weights   09/28/20 0500 09/28/2020 0500 09/30/20 0500  Weight: 121 kg 121.4 kg 117.9 kg    Intake/Output:   Intake/Output Summary (Last 24 hours) at 09/30/2020 0752 Last data filed at 09/30/2020 0700 Gross per 24 hour  Intake 1689.54 ml  Output 1986 ml  Net -296.46 ml     Physical Exam: General:  Obese male lying in bed. No resp difficulty HEENT: normal Neck: supple. JVP elevated to ear. Carotids 2+ bilat; no bruits. No lymphadenopathy or thryomegaly appreciated. Cor: PMI nondisplaced. Regular rate & rhythm.Distant HS  Lungs: clear anteriorly Abdomen: obese soft, nontender, nondistended. No hepatosplenomegaly. No bruits or masses. Good bowel sounds. Extremities: no cyanosis, clubbing, rash, tr-1+ edema LFV trialysis catheter Neuro: alert & orientedx3, cranial nerves grossly intact. moves all 4 extremities w/o difficulty. Affect pleasant  Telemetry: Sinus 60-70 Personally reviewed   Labs: Basic Metabolic Panel: Recent Labs  Lab 09/24/20 0248 09/25/20 0353 09/02/2020 0916 09/09/2020 0923 09/11/2020 1649 09/17/2020 1649 09/28/20 0042 09/28/20 0042 09/21/2020 0049 09/15/2020 0049 09/08/2020 0821 09/24/2020 0827 09/26/2020 0828 09/12/2020 1449 09/30/20 0059  NA 136   < >  --    < > 136   < > 136   < > 134*   < > 136 134* 134* 135 135  K 3.9   < >  --    < > 4.4   < > 4.8   < > 4.5   < > 3.9 3.9 4.1 4.4 4.8  CL 95*   < >  --    --  91*  --  91*  --  88*  --   --   --   --  89* 90*  CO2 25   < >  --   --  24  --  24  --  23  --   --   --   --  23 17*  GLUCOSE 124*   < >  --   --  290*  --  263*  --  197*  --   --   --   --  119* 67*  BUN 18   < >  --   --  19  --  22  --  31*  --   --   --   --  34* 30*  CREATININE 5.89*   < >  --   --  7.07*  --  7.41*  --  8.60*  --   --   --   --  9.02* 7.48*  CALCIUM 9.0   < >  --   --  10.1   < > 10.1   < > 10.4*  --   --   --   --  10.3 10.1  MG  --    < > 2.5*  --  2.5*  --  2.6*  --  2.7*  --   --   --   --   --  2.9*  PHOS 4.1  --   --   --  6.1*  --   --   --   --   --   --   --   --  6.9* 6.2*   < > = values in this interval not displayed.    Liver Function Tests: Recent Labs  Lab 09/23/20 1119 09/23/20 1119 09/24/20 0248 09/23/2020 1649 09/28/20 0042 09/19/2020 1449 09/30/20 0059  AST 58*  --   --  74* 71*  --   --   ALT 25  --   --  45* 50*  --   --   ALKPHOS 164*  --   --  158* 145*  --   --   BILITOT 1.3*  --   --  1.5* 1.3*  --   --   PROT 7.0  --   --  7.6 7.1  --   --   ALBUMIN 3.1*   < > 2.8* 3.3* 3.2* 3.4* 3.5   < > = values in this interval not displayed.   No results for input(s): LIPASE, AMYLASE in the last 168 hours. No results for input(s): AMMONIA in the last 168 hours.  CBC: Recent Labs  Lab 09/24/20 0248 09/24/20 0248 09/25/20 0353 09/25/20 0353 09/03/2020 0356 09/23/2020 0923 09/28/20 0042 09/28/20 0042 09/28/2020 0049 09/06/2020 0821 09/05/2020 0827 09/17/2020 0828 09/02/2020 1449  WBC 8.7   < > 9.5  --  13.1*  --  18.9*  --  18.4*  --   --   --  14.4*  NEUTROABS 6.3  --   --   --  10.0*  --   --   --   --   --   --   --   --   HGB 10.0*   < > 11.0*   < > 10.9*   < > 10.5*   < > 11.6* 11.9* 11.9* 12.6* 11.9*  HCT 29.7*   < > 33.1*   < > 33.7*   < > 32.0*   < > 35.2* 35.0* 35.0* 37.0* 36.6*  MCV 92.5   < > 94.0  --  96.8  --  95.8  --  94.6  --   --   --  95.8  PLT 225   < > 249  --  302  --  301  --  296  --   --   --  299   < > = values in  this interval not displayed.    Cardiac Enzymes: No results for input(s): CKTOTAL, CKMB, CKMBINDEX, TROPONINI in the last 168 hours.  BNP: BNP (last 3 results) Recent Labs    09/10/2020 0356  BNP 1,039.7*    ProBNP (last 3 results) No results for input(s): PROBNP in the last 8760 hours.    Other results:  Imaging: CARDIAC CATHETERIZATION  Result Date: 09/03/2020  Ost LM lesion is 100% stenosed.  Prox RCA to Mid RCA lesion is 100% stenosed.  1st Mrg lesion is 70% stenosed.  RIMA.  Prox RCA-1 lesion is 30% stenosed.  Previously placed Prox RCA-2 stent (unknown type) is widely patent.  Findings: Ao = 99/63 (77) LV = 100/35 RA =  32 RV = 60/26 PA = 66/23 (43) PCW = 35 Fick cardiac output/index = 4.7/2.1 PVR = 1.7 WU PAPi = 1.3 FA sat =100% PA sat =  57%, 60% Assessment: 1. LM: Totally occluded ostially (chronic) 2. RCA: Newly occluded in midsection (new since CABG 2018) 3. LIMA: Atretic 4. Free RIMA to LAD: WIdely patent. LAD is large vessel free of significant stenosis after graft insertion 5. SVG to OM1: Widely patent. Backfills a large portion of the LCX. There is a 70% lesion prior to graft insertion 6. Severe biventricular HF with markedly elevated filling pressures Plan/Discussion: Continue medical therapy. Will need HD today. [name], MD 9:10 AM      Medications:     Scheduled Medications: . ammonium lactate  1 application Topical Daily  . amoxicillin-clavulanate  1 tablet Oral Q12H  . aspirin  81 mg Oral Daily  . atorvastatin  40 mg Oral QHS  . chlorhexidine  15 mL Mouth Rinse BID  . Chlorhexidine Gluconate Cloth  6 each Topical Daily  . cinacalcet  30 mg Oral Q supper  . clopidogrel  75 mg Oral Daily  . enoxaparin (LOVENOX) injection  30 mg Subcutaneous Q24H  . fluticasone furoate-vilanterol  1 puff Inhalation Daily  . heparin  4,000 Units Dialysis Once in dialysis  . insulin aspart  0-9 Units Subcutaneous Q4H  . insulin glargine  15 Units Subcutaneous  QHS  . lamoTRIgine  150 mg Oral Daily  . mouth rinse  15 mL Mouth Rinse q12n4p  . midodrine  10 mg Oral TID WC  . sevelamer carbonate  1,600 mg Oral TID WC  . sodium chloride flush  10-40 mL Intracatheter Q12H  . sodium chloride flush  3 mL Intravenous Q12H  . sodium zirconium cyclosilicate  10 g Oral Once     Infusions: .  prismasol BGK 4/2.5 400 mL/hr at 09/30/20 0525  .  prismasol BGK 4/2.5 200 mL/hr at 09/30/20 0525  . sodium chloride    . sodium chloride    . sodium chloride Stopped (09/30/20 0604)  . sodium chloride    . amiodarone 30 mg/hr (09/30/20 0700)  . dextrose 40 mL/hr at 09/30/20 0700  . norepinephrine (LEVOPHED) Adult infusion 7 mcg/min (09/30/20 0700)  . prismasol BGK 4/2.5 2,000 mL/hr at 09/30/20 0748     PRN Medications:  sodium chloride, sodium chloride, Place/Maintain arterial line **AND** sodium chloride, acetaminophen, albuterol, alteplase, heparin, heparin, hydrocerin, ipratropium-albuterol, lidocaine (PF), lidocaine-prilocaine, ondansetron (ZOFRAN) IV, pentafluoroprop-tetrafluoroeth, sodium chloride flush   Assessment/Plan:   1. 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 CVP 31 and PCWP 35. CI 2.1 (PA sat 59%) PaPi 1.3 - NYHA IV - Massively volume overloaded and unable to pull fluid with HD due to hypotension. Now on CVVHD. Still overloaded.  Using NE to support BP. Need to pull more if possible.  - Given severity of his HF, I am remain very concerned about his ability to be maintained on long-term HD - Will ask Palliative Care to get involved to determine [place]  2. ESRD on HD - As above massively volume overloaded and unable to pull fluid with HD due to hypotension - Now on NE and CVVHD. Need to pull more if possible.  - Given severity of his HF, I am remain very concerned about his ability to be maintained on long-term HD   3. VT arrest - in setting of  respiratory failure and accidental dilaudid overdose - rhythm stable on amio. Can likely switch to po. Need to watch carefully in setting of trifascicualr block - not ICD candidate  given limited progrnosis  4. CAD s/p re-do CABG - cath 11/30 with severe native CAD. RIMA to LAD and SVG to OM patent - medical therapy - no current s/s angina  5. PAD - s/p recent amputation  CRITICAL CARE Performed by: [name]  Total critical care time: 45 minutes  Critical care time was exclusive of separately billable procedures and treating other patients.  Critical care was necessary to treat or prevent imminent or life-threatening deterioration.  Critical care was time spent personally by me (independent of midlevel providers or residents) on the following activities: development of treatment plan with patient and/or surrogate as well as nursing, discussions with consultants, evaluation of patient's response to treatment, examination of patient, obtaining history from patient or surrogate, ordering and performing treatments and interventions, ordering and review of laboratory studies, ordering and review of radiographic studies, pulse oximetry and re-evaluation of patient's condition.     Length of Stay: 3   [name] MD 09/30/2020, 7:52 AM  Advanced Heart Failure Team Pager [phone] (M-F; [place])  Please contact [facility] for night-coverage after hours (4p -7a ) and weekends on [email or web address]

## 2020-09-30 NOTE — Consult Note (Signed)
[place] Nurse follow-up consult Note: Vac change to left foot.  Pt was medicated for pain prior to the procedure and tolerated with minimal amt discomfort.  Wound type: Left anterior foot with full thickness wound; refer to previous notesd for measurements. Wound bed: 20% yellow adipose, 80% red and moist Drainage (amount, consistency, odor) scant amt pink drainage Periwound: intact skin surrounding Dressing procedure/placement/frequency: Applied barrier ring around the wound to attempt to maintain a seal, then one piece black foam to 166mm cont suction.  [facility] team will plan to change dressing Q M/W/F.  Topical treatment orders provided for staff nurses as follows when the patient is discharged: When pt is ready to discharge, remove hospital Vac and dressing and apply moist gauze dressing and kerlex, and send home NPWT device/charger home with family or document clearly where the items area and make sure to transfer with patient. Julien Girt MSN, RN, [place], [place], [place]

## 2020-09-30 NOTE — Progress Notes (Signed)
NAME:  Connor Hoover, MRN:  [id number], DOB:  11/29/1945, LOS: 3 ADMISSION DATE:  09/11/2020, CONSULTATION DATE:  11/28 REFERRING MD:  Dr. Christy Gentles, CHIEF COMPLAINT:  Cardiac Arrest    Brief History   74 y/o M who presented for AMS 11/28 after dosing of dilaudid for dressing change. Was admitted for evaluation by IMTS.  Developed 2 min VT arrest in ER with ROSC and return of mental status.   History of present illness   74 y/o M who presented to [facility] on 11/28 for AMS.   The patient is a poor historian but his wife provides detailed information.  He is followed at the [facility].  The patient developed what he thought was an in-grown toenail and it became infected requiring admission from 09/16/20 through 11/21 at the [place].  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 He was also identified to have worsening LVEF on ECHO, PAD on LLE requiring angioplasty.  He fell prior to leaving the hospital and had difficulty getting into the car for discharge - he required 3 people to get into the car. At discharge, he was started on lopressor, plavix and midodrine.  After returning home, he continued to have weakness, nausea and vomiting.  He was readmitted to [facility] from 11/24 - 11/26 for acute on chronic systolic CHF, weakness. Lopressor was held and he was noted to have an episode of asymptomatic VT on 11/25.  He is normally a T/Th/S HD patient and was changed to Monday for the week of Thanksgiving for the holiday. He was unable to make it to HD on Monday due to fatigue. He was discharged off his lopressor due to soft BP's.   Wife reports am of admit he woke and was short of breath.  She tried oxygen and CPAP but they did not relieve his dyspnea.  She reports he "passed out" and EMS was called. He became responsive during the 911 call and EMS transported him to the ER.  In the ER his labs were relatively normal.  Wife notes he had dilaudid on 11/27 at 4pm for a wound VAC  change.  In the ER, he had an episode of VT and required 2 min CPR with return of ROSC after 1 epi and normal mental status.  He was placed on BiPAP after for comfort.   PCCM called for evaluation.   Past Medical History  DM II  ESRD  Chronic Combined CHF / HFrEF  CAD - Inferior STEMI 10/2009 s/p PCI of RCA, s/p CABG PAD  Osteomyelitis - left great toe s/p amputation at [facility], d/c'd to SNF on 11/21.   Significant Hospital Events   11/28 Admit, 2 min VT arrest 11/30 R/LHC/ iHD  Consults:  Cardiology  Nephrology   Procedures:  11/30 L femoral Trialysis catheter  Significant Diagnostic Tests:  11/28 CXR>>Cardiac enlargement with improving vascular congestion. Atelectasis in the left base with possible small left pleural effusion, unchanged.  11/28 Echo>>No mural thrombus with Definity contrast. Left ventricular ejection fraction, by estimation, is <20%. The left ventricle has severely decreased function. The left ventricle demonstrates global hypokinesis. The left ventricular internal cavity size was moderately dilated. Left ventricular diastolic parameters are consistent with Grade II diastolic dysfunction (pseudonormalization). Elevated left ventricular end-diastolic pressure.   11/30 R/LHC >> Ao = 99/63 (77) LV = 100/35 RA =  32 RV = 60/26 PA = 66/23 (43) PCW = 35 Fick cardiac output/index = 4.7/2.1 PVR =  1.7 WU PAPi = 1.3 FA sat =100% PA sat = 57%, 60%  1. LM: Totally occluded ostially (chronic) 2. RCA: Newly occluded in midsection (new since CABG 2018) 3. LIMA: Atretic 4. Free RIMA to LAD: WIdely patent. LAD is large vessel free of significant stenosis after graft insertion 5. SVG to OM1: Widely patent. Backfills a large portion of the LCX. There is a 70% lesion prior to graft insertion 6. Severe biventricular HF with markedly elevated filling pressures  Micro Data:  COVID 11/28 >> negative  Influenza A/B 11/28 >> negative   Antimicrobials:  11/24 vanc/ zosyn   11/29 Augmentin  >>  Interim history/subjective:  Pt awake on nasal cannula, hypoglycemic overnight and started on D10. No complaints, remains on CRRT  Objective   Blood pressure (!) 94/45, pulse 66, temperature (!) 97.3 F (36.3 C), temperature source Axillary, resp. rate (!) 23, height 5' 6.5" (1.689 m), weight 117.9 kg, SpO2 100 %.        Intake/Output Summary (Last 24 hours) at 09/30/2020 0721 Last data filed at 09/30/2020 0700 Gross per 24 hour  Intake 1689.54 ml  Output 1986 ml  Net -296.46 ml   Filed Weights   09/28/20 0500 09/28/2020 0500 09/30/20 0500  Weight: 121 kg 121.4 kg 117.9 kg    General:  Obese M, chronically ill, non-toxic appearing M in no acute distress HEENT: MM pink/moist Neuro: Alert, oriented, following all commands CV: rr, no murmur, LUE AVF +t/b PULM:  Non labored, decreased air movement bilateral bases GI: soft, bs active  Extremities: warm/dry, pre-tibial 1+, chronic lower wound changes, wound vac to left foot Skin: no rashes  Resolved Hospital Problem list     Assessment & Plan:   Witnessed VT Arrest with ischemic cardiomyopathy, CAD s/p CABG 2017 and redo 2018  Biventricular heart failure NYHA IV 2 min CPR in ER, s/p epi x1  R/LHC this morning- > showing chronic occlusions and newly occluded RCA since CABG 2018, severe biventricular HF  P: - trop peaked at 63, EF worsening compared to recent s/u at [facility], now <20% - Cardiology and HF team following, remains volume overloaded, transitioned to CRRT secondary to hypotension. Nephrology maximizing fluid removal as BP will tolerate, Remains on Levophed 69mcg/min -Medical therapy for CAD -Not a candidate for ICD given co-morbidities and prognosis <53yr, palliative consult - Continue Amiodarone, Plavix, Lipitor   Acute Metabolic Encephalopathy / Accidental Overdose  In setting of narcotic administration for recent amputation  P: - resolved, continue to minimize sedating meds - serial neuro exams    Chronic Hypotension  P: - continue Midodrine 10mg  TID - peripheral NE and CRRT, pressor need down this AM, maintain MAP >60 -unreliable cuff pressures, may need A-line  ESRD, hyperkalemia, AGMA Developed shock after his CABG requiring ECMO and dialysis, TTS schedule  P: - Nephrology following - transitioning from iHD to CRRT yesterday -AGMA may be secondary to renal failure, however in the setting of hypotension and leukocytosis will send lactic acid  COPD without Exacerbation  OSA  Possible aspiration PNA in the setting of chest compressions Has home CPAP, does not wear  P: -stable respiratory status today -CPAP QHS    -continue Breo - continue Augmentin day 3/x  DM II  Hypoglycemic overnight and started on D10 infusion P: -Hold Lantus, change SSI to very sensitive scale and hold D10 in the setting of volume overload -closely monitor   L Great Toe Amputation  -WOC following   Leukocytosis Pt is afebrile, possibly  reactive, no clear source of infection; possible aspiration P: - follow blood cultures, continue Augmentin and check lactic acid  Frequent Falls, Deconditioning  -PT consult -suspect he will need short term rehab before returning home based on recent hx of falls, pending palliative consult, multiple admits  Best practice (evaluated daily)  Diet: diabetic   Pain/Anxiety/Delirium protocol (if indicated): n/a  VAP protocol (if indicated): n/a  DVT prophylaxis: Lovenox GI prophylaxis: per primary  Glucose control: SSI Mobility: As tolerated  last date of multidisciplinary goals of care discussion: palliative care consulted, attempted to update wife 12/1, no answer Family and staff present: pending  Summary of discussion: pending Follow up goals of care discussion due: 12/5  Code Status: Full Code  Disposition: ICU  Labs   CBC: Recent Labs  Lab 09/24/20 0248 09/24/20 0248 09/25/20 0353 09/25/20 0353 09/04/2020 0356 09/06/2020 0923 09/28/20 0042  09/28/20 0042 08/31/2020 0049 09/24/2020 0821 09/25/2020 0827 09/09/2020 0828 09/14/2020 1449  WBC 8.7   < > 9.5  --  13.1*  --  18.9*  --  18.4*  --   --   --  14.4*  NEUTROABS 6.3  --   --   --  10.0*  --   --   --   --   --   --   --   --   HGB 10.0*   < > 11.0*   < > 10.9*   < > 10.5*   < > 11.6* 11.9* 11.9* 12.6* 11.9*  HCT 29.7*   < > 33.1*   < > 33.7*   < > 32.0*   < > 35.2* 35.0* 35.0* 37.0* 36.6*  MCV 92.5   < > 94.0  --  96.8  --  95.8  --  94.6  --   --   --  95.8  PLT 225   < > 249  --  302  --  301  --  296  --   --   --  299   < > = values in this interval not displayed.    Basic Metabolic Panel: Recent Labs  Lab 09/24/20 0248 09/25/20 0353 09/12/2020 0916 09/05/2020 0923 09/01/2020 1649 09/13/2020 1649 09/28/20 0042 09/28/20 0042 09/25/2020 0049 09/18/2020 0049 09/28/2020 0821 09/16/2020 0827 09/26/2020 0828 09/21/2020 1449 09/30/20 0059  NA 136   < >  --    < > 136   < > 136   < > 134*   < > 136 134* 134* 135 135  K 3.9   < >  --    < > 4.4   < > 4.8   < > 4.5   < > 3.9 3.9 4.1 4.4 4.8  CL 95*   < >  --   --  91*  --  91*  --  88*  --   --   --   --  89* 90*  CO2 25   < >  --   --  24  --  24  --  23  --   --   --   --  23 17*  GLUCOSE 124*   < >  --   --  290*  --  263*  --  197*  --   --   --   --  119* 67*  BUN 18   < >  --   --  19  --  22  --  31*  --   --   --   --  34* 30*  CREATININE 5.89*   < >  --   --  7.07*  --  7.41*  --  8.60*  --   --   --   --  9.02* 7.48*  CALCIUM 9.0   < >  --   --  10.1  --  10.1  --  10.4*  --   --   --   --  10.3 10.1  MG  --    < > 2.5*  --  2.5*  --  2.6*  --  2.7*  --   --   --   --   --  2.9*  PHOS 4.1  --   --   --  6.1*  --   --   --   --   --   --   --   --  6.9* 6.2*   < > = values in this interval not displayed.   GFR: Estimated Creatinine Clearance: 10.9 mL/min (A) (by C-G formula based on SCr of 7.48 mg/dL (H)). Recent Labs  Lab 09/11/2020 0356 09/28/20 0042 09/16/2020 0049 09/18/2020 1449  WBC 13.1* 18.9* 18.4* 14.4*    Liver  Function Tests: Recent Labs  Lab 09/23/20 1119 09/23/20 1119 09/24/20 0248 09/04/2020 1649 09/28/20 0042 09/06/2020 1449 09/30/20 0059  AST 58*  --   --  74* 71*  --   --   ALT 25  --   --  45* 50*  --   --   ALKPHOS 164*  --   --  158* 145*  --   --   BILITOT 1.3*  --   --  1.5* 1.3*  --   --   PROT 7.0  --   --  7.6 7.1  --   --   ALBUMIN 3.1*   < > 2.8* 3.3* 3.2* 3.4* 3.5   < > = values in this interval not displayed.   No results for input(s): LIPASE, AMYLASE in the last 168 hours. No results for input(s): AMMONIA in the last 168 hours.  ABG    Component Value Date/Time   PHART 7.389 09/10/2020 0821   PCO2ART 44.4 09/17/2020 0821   PO2ART 183 (H) 09/02/2020 0821   HCO3 30.5 (H) 09/26/2020 0828   TCO2 32 09/21/2020 0828   O2SAT 60.0 09/03/2020 0828     Coagulation Profile: No results for input(s): INR, PROTIME in the last 168 hours.  Cardiac Enzymes: No results for input(s): CKTOTAL, CKMB, CKMBINDEX, TROPONINI in the last 168 hours.  HbA1C: Hgb A1c MFr Bld  Date/Time Value Ref Range Status  09/23/2020 11:19 AM 7.2 (H) 4.8 - 5.6 % Final    Comment:    (NOTE) Pre diabetes:          5.7%-6.4%  Diabetes:              >6.4%  Glycemic control for   <7.0% adults with diabetes     CBG: Recent Labs  Lab 09/30/20 0115 09/30/20 0145 09/30/20 0457 09/30/20 0522 09/30/20 0641  GLUCAP 46* 90 63* 83 82   CRITICAL CARE Performed by: Otilio Carpen Serai Tukes   Total critical care time: 45 minutes  Critical care time was exclusive of separately billable procedures and treating other patients.  Critical care was necessary to treat or prevent imminent or life-threatening deterioration.  Critical care was time spent personally by me on the following activities: development of treatment plan with patient and/or surrogate as well as nursing, discussions with  consultants, evaluation of patient's response to treatment, examination of patient, obtaining history from patient or  surrogate, ordering and performing treatments and interventions, ordering and review of laboratory studies, ordering and review of radiographic studies, pulse oximetry and re-evaluation of patient's condition.   Otilio Carpen Mesha Schamberger, PA-C [place] [facility]  Pager# [phone], if no answer [phone]

## 2020-09-30 NOTE — Progress Notes (Signed)
OT Cancellation Note  Patient Details Name: Connor Hoover MRN: [id number] DOB: Aug 01, 1946   Cancelled Treatment:    Reason Eval/Treat Not Completed: Medical issues which prohibited therapy Pt now on CRRT with femoral access, plan for palliative care meeting today to determine [place].   [name] 09/30/2020, 12:07 PM  Nestor Lewandowsky, OTR/L Acute Rehabilitation Services Pager: [phone] Office: [phone]

## 2020-09-30 NOTE — Progress Notes (Signed)
Connor Hoover   Subjective: On CRRT. A & O X 3. Still with SOB. Issues with filter clotting over night, hypoglycemia. Briefly on D10W over night. Still nauseated.   Objective Vitals:   09/30/20 0500 09/30/20 0600 09/30/20 0640 09/30/20 0700  BP: 106/76 109/75  (!) 94/45  Pulse:      Resp: (!) 28 (!) 26  (!) 23  Temp:   (!) 97.3 F (36.3 C)   TempSrc:   Axillary   SpO2: 100%     Weight: 117.9 kg     Height:       Physical Exam General:Chronically ill appearing male in NAD Heart:HS distant, S1,S2 RRR No M. 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 Lungs:CTAB. No WOB. Abdomen:Active BS Extremities:Trace BLE edema.Venous stasis discoloration BLE. Drsg L foot recent toe amps. Wound vac in plae.  Dialysis Access:L AVF + bruit    Additional Objective Labs: Basic Metabolic Panel: Recent Labs  Lab 09/09/2020 1649 09/28/20 0042 09/28/2020 0049 09/18/2020 0821 09/07/2020 0828 09/17/2020 1449 09/30/20 0059  NA 136   < > 134*   < > 134* 135 135  K 4.4   < > 4.5   < > 4.1 4.4 4.8  CL 91*   < > 88*  --   --  89* 90*  CO2 24   < > 23  --   --  23 17*  GLUCOSE 290*   < > 197*  --   --  119* 67*  BUN 19   < > 31*  --   --  34* 30*  CREATININE 7.07*   < > 8.60*  --   --  9.02* 7.48*  CALCIUM 10.1   < > 10.4*  --   --  10.3 10.1  PHOS 6.1*  --   --   --   --  6.9* 6.2*   < > = values in this interval not displayed.   Liver Function Tests: Recent Labs  Lab 09/23/20 1119 09/24/20 0248 09/16/2020 1649 09/26/2020 1649 09/28/20 0042 09/14/2020 1449 09/30/20 0059  AST 58*  --  74*  --  71*  --   --   ALT 25  --  45*  --  50*  --   --   ALKPHOS 164*  --  158*  --  145*  --   --   BILITOT 1.3*  --  1.5*  --  1.3*  --   --   PROT 7.0  --  7.6  --  7.1  --   --   ALBUMIN 3.1*   < > 3.3*   < > 3.2* 3.4* 3.5   < > = values in this interval not displayed.   No results for input(s): LIPASE, AMYLASE in the last 168 hours. CBC: Recent Labs  Lab 09/24/20 0248  09/24/20 0248 09/25/20 0353 09/25/20 0353 09/06/2020 0356 09/26/2020 0923 09/28/20 0042 09/28/20 0042 09/22/2020 0049 09/28/2020 0821 09/06/2020 0827 09/08/2020 0828 09/18/2020 1449  WBC 8.7   < > 9.5   < > 13.1*  --  18.9*  --  18.4*  --   --   --  14.4*  NEUTROABS 6.3  --   --   --  10.0*  --   --   --   --   --   --   --   --   HGB 10.0*   < > 11.0*   < > 10.9*   < > 10.5*   < >  11.6*   < > 11.9* 12.6* 11.9*  HCT 29.7*   < > 33.1*   < > 33.7*   < > 32.0*   < > 35.2*   < > 35.0* 37.0* 36.6*  MCV 92.5   < > 94.0  --  96.8  --  95.8  --  94.6  --   --   --  95.8  PLT 225   < > 249   < > 302  --  301  --  296  --   --   --  299   < > = values in this interval not displayed.   Blood Culture    Component Value Date/Time   SDES URINE, CATHETERIZED 01/16/2020 1329   SPECREQUEST NONE 01/16/2020 1329   CULT  01/16/2020 1329    NO GROWTH Performed at [facility], [place] [address] [place], [place] [address]    REPTSTATUS 01/17/2020 FINAL 01/16/2020 1329    Cardiac Enzymes: No results for input(s): CKTOTAL, CKMB, CKMBINDEX, TROPONINI in the last 168 hours. CBG: Recent Labs  Lab 09/30/20 0115 09/30/20 0145 09/30/20 0457 09/30/20 0522 09/30/20 0641  GLUCAP 46* 90 63* 83 82   Iron Studies: No results for input(s): IRON, TIBC, TRANSFERRIN, FERRITIN in the last 72 hours. @lablastinr3 @ Studies/Results: CARDIAC CATHETERIZATION  Result Date: 09/14/2020  Ost LM lesion is 100% stenosed.  Prox RCA to Mid RCA lesion is 100% stenosed.  1st Mrg lesion is 70% stenosed.  RIMA.  Prox RCA-1 lesion is 30% stenosed.  Previously placed Prox RCA-2 stent (unknown type) is widely patent.  Findings: Ao = 99/63 (77) LV = 100/35 RA =  32 RV = 60/26 PA = 66/23 (43) PCW = 35 Fick cardiac output/index = 4.7/2.1 PVR = 1.7 WU PAPi = 1.3 FA sat =100% PA sat = 57%, 60% Assessment: 1. LM: Totally occluded ostially (chronic) 2. RCA: Newly occluded in midsection (new since CABG 2018) 3. LIMA: Atretic 4. Free RIMA  to LAD: WIdely patent. LAD is large vessel free of significant stenosis after graft insertion 5. SVG to OM1: Widely patent. Backfills a large portion of the LCX. There is a 70% lesion prior to graft insertion 6. Severe biventricular HF with markedly elevated filling pressures Plan/Discussion: Continue medical therapy. Will need HD today. [name], MD 9:10 AM   Medications: .  prismasol BGK 4/2.5 400 mL/hr at 09/30/20 0525  .  prismasol BGK 4/2.5 200 mL/hr at 09/30/20 0525  . sodium chloride    . sodium chloride    . sodium chloride Stopped (09/30/20 0604)  . sodium chloride    . amiodarone 30 mg/hr (09/30/20 0700)  . dextrose 40 mL/hr at 09/30/20 0700  . norepinephrine (LEVOPHED) Adult infusion 7 mcg/min (09/30/20 0700)  . prismasol BGK 4/2.5 2,000 mL/hr at 09/30/20 0748   . ammonium lactate  1 application Topical Daily  . amoxicillin-clavulanate  1 tablet Oral Q12H  . aspirin  81 mg Oral Daily  . atorvastatin  40 mg Oral QHS  . chlorhexidine  15 mL Mouth Rinse BID  . Chlorhexidine Gluconate Cloth  6 each Topical Daily  . cinacalcet  30 mg Oral Q supper  . clopidogrel  75 mg Oral Daily  . enoxaparin (LOVENOX) injection  30 mg Subcutaneous Q24H  . fluticasone furoate-vilanterol  1 puff Inhalation Daily  . heparin  4,000 Units Dialysis Once in dialysis  . insulin aspart  0-9 Units Subcutaneous Q4H  . insulin glargine  15 Units Subcutaneous  QHS  . lamoTRIgine  150 mg Oral Daily  . mouth rinse  15 mL Mouth Rinse q12n4p  . midodrine  10 mg Oral TID WC  . sevelamer carbonate  1,600 mg Oral TID WC  . sodium chloride flush  10-40 mL Intracatheter Q12H  . sodium chloride flush  3 mL Intravenous Q12H  . sodium zirconium cyclosilicate  10 g Oral Once     Dialysis Orders: TTS at [facility] -- full HD yesterday 11/27 4:15hr, 450/800, EDW 116kg, 2K/2Ca, AVF, -heparin 8000units IVbolus TIW -Calcitriol 2.32mcg PO q HD -No ESA  Assessment/Plan: 1. VT arrest with quick  ROSC:HxShort run NSVT duringlastadmit. Cardiology following, on amiodaroneIV at 30 mg/hr.  2. Ischemic CM: Repeat ECHO 11/28 estimates EF < 20%. LV with global hypokinesis, G2DD. L & R heart cath today. LVEDP 35, severe BiV failure. Start CRRT and maximize volume removal as tolerated.  3. H/O prior CABG and Redo CABG (2017, 2018). Cardiology following. 4. ESRD: Usual TTS schedule.As noted above, changing to CRRT.  5. Hypotension/volume: BP remains soft on Levolphed at 7 mcg/min. Volume removal impeded by hypotension. Uptitrate Levo, continue midodrine. +755 cc this AM. UFG about 100-120 cc net. Wt if correct is down 3.5 kg.  6. Anemia: Hgb11.6 09/24/2020- no ESA needed. recheckin 7. Metabolic bone disease: C Ca 11, Phos climbing, not eating. Continue binders when able to eat. - hold PO VDRA for now. 8. Nutrition: Albumin on low side, PO intake impeded by nausea. On renal diet fld restrictions. Added protein supps, renal vit.  9. DM- Per primary 10. Accidental OD on Dilaudid-Patient is alert & oriented. Per Primary.  Connor Hoover Monica NP-C 09/30/2020, 7:51 AM  [employer] [phone]

## 2020-09-30 NOTE — Progress Notes (Signed)
Hypoglycemic Event  CBG: 62  Treatment: 4 oz sprite  Symptoms: None  Follow-up CBG: Time:0046 CBG Result:60  Possible Reasons for Event: Unknown   Hypoglycemic Event  CBG: 60  Treatment: 4 oz orange juice (pt refused sprite or ginger ale)  Symptoms: None  Follow-up CBG: Time:0115 CBG Result:46  Possible Reasons for Event: Unknown  Comments/MD notified: E-link aware  Hypoglycemic Event  CBG: 46  Treatment: D50 50 mL (25 gm)  Symptoms: None  Follow-up CBG: Time:0146 CBG Result:90  Possible Reasons for Event: Unknown  Comments/MD notified:E-link aware    Connor Hoover B

## 2020-09-30 NOTE — Progress Notes (Signed)
RT attempted to place a line X's 2, unable. RN aware at this time.

## 2020-09-30 NOTE — Progress Notes (Signed)
Hypoglycemic Event  CBG: 63  Treatment: D50 25 mL (12.5 gm)  Symptoms: None  Follow-up CBG: Time:0522 CBG Result:83  Possible Reasons for Event: Unknown  Comments/MD notified:E-link aware    Connor Hoover B

## 2020-09-30 NOTE — Consult Note (Signed)
Consultation Note Date: 09/30/2020   Patient Name: Connor Hoover  DOB: 1946-08-24  MRN: [id number]  Age / Sex: 74 y.o., male  [facility]P: [facility] [place] Referring Physician: Julian Hy, DO  Reason for Consultation: Establishing goals of care and Psychosocial/spiritual support  HPI/Patient Profile:  74 y.o. 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  History of inferior STEMI in 2011 with intervention on a totally occluded RCA and staged PCI of the mid LAD.  Subsequent CABG, and 2018 had subsequent MI and underwent redo CABG.  Developed shock requiring ECMO, subsequently required dialysis.  Admitted for accidental Dilaudid overdose, cardiology involved due to episode of sustained ventricular tachycardia requiring CPR and epinephrine.  Patient remains in the ICU,  today is day 3 of this hospitalization.  Patient is on continuous CRRT, he is high risk for decompensation secondary to multiple comorbidities.   Patient and his family face treatment option decisions, advanced directive decisions and anticipatory care needs.     Clinical Assessment and Goals of Care:   This NP Wadie Lessen reviewed medical records, received report from team, assessed the patient and then meet at the patient's bedside along with his wife and 2 pastors to discuss diagnosis, prognosis, GOC, EOL wishes disposition and options.   Concept of Palliative Care was introduced as specialized medical care for people and their families living with serious illness.  If focuses on providing relief from the symptoms and stress of a serious illness.  The goal is to improve quality of life for both the patient and the family.  Created space and opportunity for patient  and  family to explore thoughts and feelings regarding current medical situation.  All verbalized an understanding of the seriousness of the situation however Mr. Chausse himself has decreased insight into the complexity of his situation and the overall medical interventions and care needs.    Patient's strength and hope for the future/medical improvement, is rooted deeply in his [place].    A  discussion was had today regarding advanced directives.  Concepts specific to code status, artifical feeding and hydration, continued IV antibiotics and rehospitalization was had.  The difference between a aggressive medical intervention path  and a palliative comfort care path for this patient at this time was had.  Values and goals of care important to patient and family were attempted to be elicited.   MOST form  introduced.  Patient completed healthcare power of attorney today with the assistance of spiritual care.  His wife is named H POA   Questions and concerns addressed.  Patient  encouraged to call with questions or concerns.     PMT will continue to support holistically.     Patient's wife was documented healthcare power of attorney today.  Documents scanned for medical records.  SUMMARY OF RECOMMENDATIONS    Code Status/Advance Care Planning:  Full code  Encouraged patient/family  to consider DNR/DNI status understanding evidenced based poor outcomes in similar hospitalized patient, as the cause of arrest is likely associated with advanced chronic illness rather than an easily reversible acute cardio-pulmonary event.   Palliative Prophylaxis:   Aspiration, Bowel Regimen, Delirium Protocol, Frequent Pain Assessment and Oral Care  Additional Recommendations (Limitations, Scope, Preferences):  Full Scope Treatment  Psycho-social/Spiritual:   Desire for further Chaplaincy support:yes  Additional Recommendations: Education on Hospice  Prognosis:   Long-term prognosis is poor  secondary to multiple comorbidities.  Patient's decision regarding life prolonging measures will impact prognosis.  Discharge Planning: To Be Determined      Primary Diagnoses: Present on Admission: **None**   I have reviewed the medical record, interviewed the patient and family, and examined the patient. The following aspects are pertinent.  Past Medical History:  Diagnosis Date  . Cardiomyopathy ([place])   . Diabetes mellitus without complication ([place])   . ESRD (end stage renal disease) ([place])    Hemodialysis T, Th, Sat  . Hypertension   . Osteomyelitis ([place])   . PTSD (post-traumatic stress disorder)    Social History   Socioeconomic History  . Marital status: Legally Separated    Spouse name: Not on file  . Number of children: Not on file  . Years of education: Not on file  . Highest education level: Not on file  Occupational History  . Not on file  Tobacco Use  . Smoking status: Former Research scientist (life sciences)  . Smokeless tobacco: Former Network engineer  . Vaping Use: Never used  Substance and Sexual Activity  . Alcohol use: Not Currently  . Drug use: Not Currently  . Sexual activity: Not on file  Other Topics Concern  . Not on file  Social History Narrative  . Not on file   Social Determinants of Health   Financial Resource Strain:   . Difficulty of Paying Living Expenses: Not on file  Food Insecurity:   . Worried About Charity fundraiser in the Last Year: Not on file  . Ran Out of Food in the Last Year: Not on file  Transportation Needs:   . Lack of Transportation (Medical): Not on file  . Lack of Transportation (Non-Medical): Not on file  Physical Activity:   . Days of Exercise per Week: Not on file  . Minutes of Exercise per Session: Not on file  Stress:   . Feeling of Stress : Not on file  Social Connections:   . Frequency of Communication with Friends and Family: Not on file  . Frequency of Social Gatherings with Friends and Family: Not on file  . Attends  Religious Services: Not on file  . Active Member of Clubs or Organizations: Not on file  . Attends Archivist Meetings: Not on file  . Marital Status: Not on file   History reviewed. No pertinent family history. Scheduled Meds: . (feeding supplement) PROSource Plus  30 mL Oral BID BM  . ammonium lactate  1 application Topical Daily  . amoxicillin-clavulanate  1 tablet Oral Q12H  . aspirin  81 mg Oral Daily  . atorvastatin  40 mg Oral QHS  . chlorhexidine  15 mL Mouth Rinse BID  . Chlorhexidine Gluconate Cloth  6 each Topical Daily  . cinacalcet  30 mg Oral Q supper  . clopidogrel  75 mg Oral Daily  . fluticasone furoate-vilanterol  1 puff Inhalation Daily  . heparin  4,000 Units Dialysis Once in dialysis  . heparin injection (subcutaneous)  5,000 Units Subcutaneous Q8H  . insulin aspart  0-6 Units Subcutaneous Q4H  . lamoTRIgine  150 mg Oral Daily  . mouth rinse  15 mL Mouth Rinse q12n4p  . midodrine  10 mg Oral TID WC  . multivitamin  1 tablet Oral QHS  . sevelamer carbonate  1,600 mg Oral TID WC  . sodium chloride flush  10-40 mL Intracatheter Q12H  . sodium chloride flush  3 mL Intravenous Q12H  . sodium zirconium cyclosilicate  10 g Oral Once   Continuous Infusions: .  prismasol BGK 4/2.5 400 mL/hr at 09/30/20 0525  .  prismasol BGK 4/2.5 200 mL/hr at 09/30/20 0525  . sodium chloride    . sodium chloride    . sodium chloride Stopped (09/30/20 0604)  . sodium chloride    . amiodarone 30 mg/hr (09/30/20 1100)  . heparin 10,000 units/ 20 mL infusion syringe 250 Units/hr (09/30/20 1100)  . norepinephrine (LEVOPHED) Adult infusion 8 mcg/min (09/30/20 1100)  . prismasol BGK 4/2.5 2,000 mL/hr at 09/30/20 1030   PRN Meds:.sodium chloride, sodium chloride, Place/Maintain arterial line **AND** sodium chloride, acetaminophen, albuterol, alteplase, heparin, heparin, heparin, hydrocerin, ipratropium-albuterol, lidocaine (PF), lidocaine-prilocaine, ondansetron (ZOFRAN) IV,  pentafluoroprop-tetrafluoroeth, sodium chloride flush Medications Prior to Admission:  Prior to Admission medications   Medication Sig Start Date End Date Taking? Authorizing Provider  acetaminophen (TYLENOL) 325 MG tablet Take 650 mg by mouth every 6 (six) hours as needed for mild pain or fever.   Yes [provider]  albuterol (VENTOLIN HFA) 108 (90 Base) MCG/ACT inhaler Inhale 2 puffs into the lungs every 6 (six) hours as needed for wheezing or shortness of breath.   Yes [provider]  ammonium lactate (LAC-HYDRIN) 12 % lotion Apply 1 application topically daily.   Yes [provider]  aspirin 81 MG chewable tablet Chew 81 mg by mouth daily.   Yes [provider]  atorvastatin (LIPITOR) 80 MG tablet Take 40 mg by mouth at bedtime.    Yes [provider]  B Complex-C-Folic Acid (B COMPLEX-VITAMIN C-FOLIC ACID) 1 MG tablet Take 1 tablet by mouth daily. 10/17/17  Yes [provider]  bacitracin 500 UNIT/GM ointment Apply 1 application topically See admin instructions. Apply small amount topically every day for the first week. Only apply after cleaning and before applying gauze dressing. Discontinue after first week.   Yes [provider]  cinacalcet (SENSIPAR) 30 MG tablet Take 30 mg by mouth daily. With largest meal   Yes [provider]  clopidogrel (PLAVIX) 75 MG tablet Take 75 mg by mouth daily.   Yes [provider]  clotrimazole (LOTRIMIN) 1 % cream Apply 1 application topically daily.   Yes [provider]  docusate sodium (COLACE) 50 MG capsule Take 50 mg by mouth 2 (two) times daily.   Yes [provider]  Fluticasone-Salmeterol (ADVAIR) 100-50 MCG/DOSE AEPB Inhale 1 puff into the lungs 2 (two) times daily.   Yes [provider]  hydrocerin (EUCERIN) CREA Apply 1 application topically 2 (two) times daily as needed (itching).    Yes [provider]  HYDROmorphone (DILAUDID)  2 MG tablet Take 2 mg by mouth every 6 (six) hours as needed for moderate pain or severe pain.   Yes [provider]  insulin glargine (LANTUS) 100 UNIT/ML injection Inject 0.26 mLs (26 Units total) into the skin at bedtime. 09/25/20  Yes Aslam, Loralyn Freshwater, MD  insulin NPH-regular Human (70-30) 100 UNIT/ML injection Inject 4 Units into the  skin in the morning, at noon, and at bedtime.   Yes [provider]  ipratropium-albuterol (DUONEB) 0.5-2.5 (3) MG/3ML SOLN Take 3 mLs by nebulization in the morning and at bedtime.   Yes [provider]  lamoTRIgine (LAMICTAL) 150 MG tablet Take 150 mg by mouth daily.   Yes [provider]  lidocaine (LMX) 4 % cream Apply 1 application topically as needed. Patient taking differently: Apply 1 application topically 2 (two) times daily as needed (dry skin).  09/15/19  Yes Joy, Shawn C, PA-C  midodrine (PROAMATINE) 10 MG tablet Take 1 pill with food before dialysis on Tuesday, Thursday and Saturday. 09/25/20  Yes Aslam, Loralyn Freshwater, MD  Multiple Vitamin (RENAL MULTIVITAMIN/ZINC PO) Take 1 tablet by mouth daily with supper.   Yes [provider]  sevelamer carbonate (RENVELA) 800 MG tablet Take 1,600 mg by mouth 3 (three) times daily with meals.   Yes [provider]  triamcinolone (KENALOG) 0.1 % Apply 1 application topically 2 (two) times daily.   Yes [provider]   Allergies  Allergen Reactions  . Lisinopril Swelling    Made the tongue swell and resulted in a trip to the ER  . Povidine [Povidone Iodine] Itching   Review of Systems  Constitutional: Positive for fatigue.  Respiratory: Positive for shortness of breath.   Neurological: Positive for weakness.    Physical Exam Constitutional:      Appearance: He is obese. He is ill-appearing.     Interventions: Nasal cannula in place.  Cardiovascular:     Rate and Rhythm: Normal rate.  Pulmonary:     Breath sounds: Examination of the right-lower field  reveals decreased breath sounds. Examination of the left-lower field reveals decreased breath sounds. Decreased breath sounds present.  Skin:    General: Skin is warm and dry.  Neurological:     Mental Status: He is alert and oriented to person, place, and time.     Vital Signs: BP (!) 126/100   Pulse 70   Temp (!) 96.4 F (35.8 C) (Axillary)   Resp (!) 23   Ht 5' 6.5" (1.689 m)   Wt 117.9 kg   SpO2 100%   BMI 41.33 kg/m  Pain Scale: 0-10 POSS *See Group Information*: S-Acceptable,Sleep, easy to arouse Pain Score: 0-No pain   SpO2: SpO2: 100 % O2 Device:SpO2: 100 % O2 Flow Rate: .O2 Flow Rate (L/min): 2 L/min  IO: Intake/output summary:   Intake/Output Summary (Last 24 hours) at 09/30/2020 1159 Last data filed at 09/30/2020 1100 Gross per 24 hour  Intake 2000.22 ml  Output 3050 ml  Net -1049.78 ml    LBM: Last BM Date: 09/24/20 Baseline Weight: Weight: 125.9 kg Most recent weight: Weight: 117.9 kg     Palliative Assessment/Data: 30% at best    Discussed with bedside RN  Time In: 1300 Time Out: 1410 Time Total: 70 minutes Greater than 50%  of this time was spent counseling and coordinating care related to the above assessment and plan.  Signed by: Wadie Lessen, NP   Please contact Palliative Medicine Team phone at [phone] for questions and concerns.  For individual provider: See Shea Evans

## 2020-09-30 NOTE — Progress Notes (Addendum)
PT Cancellation Note  Patient Details Name: Connor Hoover MRN: [id number] DOB: 07-Jan-1946   Cancelled Treatment:    Reason Eval/Treat Not Completed: (P) Medical issues which prohibited therapy Pt has been placed on CRRT with L femoral access. Awaiting goals of care meeting as pt will likely not be able to come off CRRT. PT will await results of meeting to instruct further therapy. PT will follow back tomorrow.   [name] B. Migdalia Dk PT, DPT Acute Rehabilitation Services Pager [phone] Office [phone]       [place] 09/30/2020, 11:57 AM

## 2020-09-30 NOTE — Plan of Care (Signed)
  Problem: Clinical Measurements: Goal: Ability to maintain clinical measurements within normal limits will improve Outcome: Progressing Goal: Will remain free from infection Outcome: Progressing Goal: Diagnostic test results will improve Outcome: Progressing Goal: Respiratory complications will improve Outcome: Progressing Goal: Cardiovascular complication will be avoided Outcome: Progressing   Problem: Education: Goal: Knowledge of General Education information will improve Description: Including pain rating scale, medication(s)/side effects and non-pharmacologic comfort measures Outcome: Progressing   Problem: Activity: Goal: Risk for activity intolerance will decrease Outcome: Progressing   Problem: Nutrition: Goal: Adequate nutrition will be maintained Outcome: Progressing   Problem: Coping: Goal: Level of anxiety will decrease Outcome: Progressing   Problem: Pain Managment: Goal: General experience of comfort will improve Outcome: Progressing   Problem: Elimination: Goal: Will not experience complications related to bowel motility Outcome: Progressing   Problem: Skin Integrity: Goal: Risk for impaired skin integrity will decrease Outcome: Progressing   Problem: Education: Goal: Knowledge of disease and its progression will improve Outcome: Progressing   Problem: Fluid Volume: Goal: Compliance with measures to maintain balanced fluid volume will improve Outcome: Progressing   Problem: Health Behavior/Discharge Planning: Goal: Ability to manage health-related needs will improve Outcome: Progressing

## 2020-09-30 NOTE — Progress Notes (Signed)
CRITICAL VALUE ALERT  Critical Value:  Lactic Acid 6.8  Date & Time Notied:  09/30/20 1520  Provider Notified: Mickel Baas Gleason PA  Orders Received/Actions taken: Will trend LA per orders. No other orders at this time

## 2020-09-30 DEATH — deceased

## 2020-10-01 ENCOUNTER — Inpatient Hospital Stay (HOSPITAL_COMMUNITY): Payer: No Typology Code available for payment source

## 2020-10-01 DIAGNOSIS — R570 Cardiogenic shock: Secondary | ICD-10-CM | POA: Diagnosis not present

## 2020-10-01 DIAGNOSIS — A419 Sepsis, unspecified organism: Secondary | ICD-10-CM

## 2020-10-01 DIAGNOSIS — I509 Heart failure, unspecified: Secondary | ICD-10-CM | POA: Diagnosis not present

## 2020-10-01 DIAGNOSIS — Z66 Do not resuscitate: Secondary | ICD-10-CM

## 2020-10-01 DIAGNOSIS — R0609 Other forms of dyspnea: Secondary | ICD-10-CM

## 2020-10-01 DIAGNOSIS — R6521 Severe sepsis with septic shock: Secondary | ICD-10-CM

## 2020-10-01 DIAGNOSIS — Z515 Encounter for palliative care: Secondary | ICD-10-CM | POA: Diagnosis not present

## 2020-10-01 LAB — GLUCOSE, CAPILLARY
Glucose-Capillary: 100 mg/dL — ABNORMAL HIGH (ref 70–99)
Glucose-Capillary: 130 mg/dL — ABNORMAL HIGH (ref 70–99)
Glucose-Capillary: 94 mg/dL (ref 70–99)

## 2020-10-01 LAB — RENAL FUNCTION PANEL
Albumin: 3.2 g/dL — ABNORMAL LOW (ref 3.5–5.0)
Anion gap: 29 — ABNORMAL HIGH (ref 5–15)
BUN: 16 mg/dL (ref 8–23)
CO2: 15 mmol/L — ABNORMAL LOW (ref 22–32)
Calcium: 9.8 mg/dL (ref 8.9–10.3)
Chloride: 92 mmol/L — ABNORMAL LOW (ref 98–111)
Creatinine, Ser: 3.73 mg/dL — ABNORMAL HIGH (ref 0.61–1.24)
GFR, Estimated: 16 mL/min — ABNORMAL LOW (ref >60)
Glucose, Bld: 120 mg/dL — ABNORMAL HIGH (ref 70–99)
Phosphorus: 4.1 mg/dL (ref 2.5–4.6)
Potassium: 5.2 mmol/L — ABNORMAL HIGH (ref 3.5–5.1)
Sodium: 136 mmol/L (ref 135–145)

## 2020-10-01 LAB — POCT ACTIVATED CLOTTING TIME
Activated Clotting Time: 184 seconds
Activated Clotting Time: 186 seconds
Activated Clotting Time: 190 seconds
Activated Clotting Time: 191 seconds
Activated Clotting Time: 196 seconds
Activated Clotting Time: 201 seconds
Activated Clotting Time: 208 seconds
Activated Clotting Time: 214 seconds

## 2020-10-01 LAB — APTT: aPTT: 190 seconds (ref 24–36)

## 2020-10-01 LAB — CBC
HCT: 33.3 % — ABNORMAL LOW (ref 39.0–52.0)
Hemoglobin: 11.2 g/dL — ABNORMAL LOW (ref 13.0–17.0)
MCH: 32.3 pg (ref 26.0–34.0)
MCHC: 33.6 g/dL (ref 30.0–36.0)
MCV: 96 fL (ref 80.0–100.0)
Platelets: 252 10*3/uL (ref 150–400)
RBC: 3.47 MIL/uL — ABNORMAL LOW (ref 4.22–5.81)
RDW: 19.2 % — ABNORMAL HIGH (ref 11.5–15.5)
WBC: 28.5 10*3/uL — ABNORMAL HIGH (ref 4.0–10.5)
nRBC: 2.1 % — ABNORMAL HIGH (ref 0.0–0.2)

## 2020-10-01 LAB — PATHOLOGIST SMEAR REVIEW

## 2020-10-01 LAB — MAGNESIUM: Magnesium: 2.7 mg/dL — ABNORMAL HIGH (ref 1.7–2.4)

## 2020-10-01 MED ORDER — MORPHINE 100MG IN NS 100ML (1MG/ML) PREMIX INFUSION
1.0000 mg/h | INTRAVENOUS | Status: DC
  Administered 2020-10-02: 2 mg/h via INTRAVENOUS
  Filled 2020-10-01: qty 100

## 2020-10-01 MED ORDER — GLYCOPYRROLATE 0.2 MG/ML IJ SOLN: 0.2000 mg | INTRAMUSCULAR | Status: DC | PRN

## 2020-10-01 MED ORDER — FENTANYL CITRATE (PF) 100 MCG/2ML IJ SOLN
100.0000 ug | Freq: Once | INTRAMUSCULAR | Status: AC
  Administered 2020-10-01: 100 ug via INTRAVENOUS
  Filled 2020-10-01: qty 2

## 2020-10-01 MED ORDER — MIDAZOLAM HCL 2 MG/2ML IJ SOLN: 2.0000 mg | INTRAMUSCULAR | Status: DC | PRN

## 2020-10-01 MED ORDER — FENTANYL CITRATE (PF) 100 MCG/2ML IJ SOLN: 12.5000 ug | Freq: Once | INTRAMUSCULAR | Status: DC

## 2020-10-01 MED ORDER — FENTANYL 2500MCG IN NS 250ML (10MCG/ML) PREMIX INFUSION
25.0000 ug/h | INTRAVENOUS | Status: DC
  Administered 2020-10-01: 25 ug/h via INTRAVENOUS
  Filled 2020-10-01: qty 250

## 2020-10-01 MED ORDER — GLYCOPYRROLATE 0.2 MG/ML IJ SOLN
0.2000 mg | INTRAMUSCULAR | Status: DC | PRN
  Administered 2020-10-02: 0.2 mg via INTRAVENOUS
  Filled 2020-10-01: qty 1

## 2020-10-01 MED ORDER — DIPHENHYDRAMINE HCL 50 MG/ML IJ SOLN: 25.0000 mg | INTRAMUSCULAR | Status: DC | PRN

## 2020-10-01 MED ORDER — FENTANYL BOLUS VIA INFUSION
25.0000 ug | INTRAVENOUS | Status: DC | PRN
  Filled 2020-10-01: qty 25

## 2020-10-01 MED ORDER — MIDAZOLAM HCL 2 MG/2ML IJ SOLN
2.0000 mg | Freq: Once | INTRAMUSCULAR | Status: AC
  Administered 2020-10-01: 2 mg via INTRAVENOUS
  Filled 2020-10-01: qty 2

## 2020-10-01 MED ORDER — GLYCOPYRROLATE 1 MG PO TABS
1.0000 mg | ORAL_TABLET | ORAL | Status: DC | PRN
  Filled 2020-10-01: qty 1

## 2020-10-01 MED ORDER — DOCUSATE SODIUM 50 MG/5ML PO LIQD: 100.0000 mg | Freq: Two times a day (BID) | ORAL | Status: DC

## 2020-10-01 MED ORDER — MORPHINE BOLUS VIA INFUSION
2.0000 mg | INTRAVENOUS | Status: DC | PRN
  Administered 2020-10-02 (×2): 2 mg via INTRAVENOUS
  Filled 2020-10-01: qty 2

## 2020-10-01 MED ORDER — MIDAZOLAM 50MG/50ML (1MG/ML) PREMIX INFUSION
2.0000 mg/h | INTRAVENOUS | Status: DC
  Administered 2020-10-01: 2 mg/h via INTRAVENOUS
  Filled 2020-10-01: qty 50

## 2020-10-01 MED ORDER — POLYVINYL ALCOHOL 1.4 % OP SOLN
1.0000 [drp] | Freq: Four times a day (QID) | OPHTHALMIC | Status: DC | PRN
  Filled 2020-10-01: qty 15

## 2020-10-01 MED ORDER — POLYETHYLENE GLYCOL 3350 17 G PO PACK: 17.0000 g | PACK | Freq: Every day | ORAL | Status: DC

## 2020-10-01 MED ORDER — SENNOSIDES-DOCUSATE SODIUM 8.6-50 MG PO TABS: 1.0000 | ORAL_TABLET | Freq: Every day | ORAL | Status: DC

## 2020-10-01 MED ORDER — DEXTROSE 5 % IV SOLN: INTRAVENOUS | Status: DC

## 2020-10-01 NOTE — Progress Notes (Signed)
OT Cancellation Note and Discharge  Patient Details Name: Connor Hoover MRN: [id number] DOB: 1946/02/12   Cancelled Treatment:    Reason Eval/Treat Not Completed: Medical issues which prohibited therapy (Pt is not appropriate for therapy. Will sign off. )  [name] 10/01/2020, 11:19 AM  Nestor Lewandowsky, OTR/L Acute Rehabilitation Services Pager: [phone] Office: [phone]

## 2020-10-01 NOTE — Progress Notes (Signed)
NAME:  Connor Hoover, MRN:  [id number], DOB:  1945-11-17, LOS: 4 ADMISSION DATE:  09/13/2020, CONSULTATION DATE:  11/28 REFERRING MD:  Dr. Christy Gentles, CHIEF COMPLAINT:  Cardiac Arrest    Brief History   74 y/o M who presented for AMS 11/28 after dosing of dilaudid for dressing change. Was admitted for evaluation by IMTS.  Developed 2 min VT arrest in ER with ROSC and return of mental status.   History of present illness   74 y/o M who presented to [facility] on 11/28 for AMS.   The patient is a poor historian but his wife provides detailed information.  He is followed at the [facility].  The patient developed what he thought was an in-grown toenail and it became infected requiring admission from 09/16/20 through 11/21 at the [place].  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 He was also identified to have worsening LVEF on ECHO, PAD on LLE requiring angioplasty.  He fell prior to leaving the hospital and had difficulty getting into the car for discharge - he required 3 people to get into the car. At discharge, he was started on lopressor, plavix and midodrine.  After returning home, he continued to have weakness, nausea and vomiting.  He was readmitted to [facility] from 11/24 - 11/26 for acute on chronic systolic CHF, weakness. Lopressor was held and he was noted to have an episode of asymptomatic VT on 11/25.  He is normally a T/Th/S HD patient and was changed to Monday for the week of Thanksgiving for the holiday. He was unable to make it to HD on Monday due to fatigue. He was discharged off his lopressor due to soft BP's.   Wife reports am of admit he woke and was short of breath.  She tried oxygen and CPAP but they did not relieve his dyspnea.  She reports he "passed out" and EMS was called. He became responsive during the 911 call and EMS transported him to the ER.  In the ER his labs were relatively normal.  Wife notes he had dilaudid on 11/27 at 4pm for a wound VAC  change.  In the ER, he had an episode of VT and required 2 min CPR with return of ROSC after 1 epi and normal mental status.  He was placed on BiPAP after for comfort.   PCCM called for evaluation.   Past Medical History  DM II  ESRD  Chronic Combined CHF / HFrEF  CAD - Inferior STEMI 10/2009 s/p PCI of RCA, s/p CABG PAD  Osteomyelitis - left great toe s/p amputation at [facility], d/c'd to SNF on 11/21.   Significant Hospital Events   11/28 Admit, 2 min VT arrest 11/30 R/LHC/ iHD  Consults:  Cardiology  Nephrology   Procedures:  11/30 L femoral Trialysis catheter  Significant Diagnostic Tests:  11/28 CXR>>Cardiac enlargement with improving vascular congestion. Atelectasis in the left base with possible small left pleural effusion, unchanged.  11/28 Echo>>No mural thrombus with Definity contrast. Left ventricular ejection fraction, by estimation, is <20%. The left ventricle has severely decreased function. The left ventricle demonstrates global hypokinesis. The left ventricular internal cavity size was moderately dilated. Left ventricular diastolic parameters are consistent with Grade II diastolic dysfunction (pseudonormalization). Elevated left ventricular end-diastolic pressure.   11/30 R/LHC >> Ao = 99/63 (77) LV = 100/35 RA =  32 RV = 60/26 PA = 66/23 (43) PCW = 35 Fick cardiac output/index = 4.7/2.1 PVR =  1.7 WU PAPi = 1.3 FA sat =100% PA sat = 57%, 60%  1. LM: Totally occluded ostially (chronic) 2. RCA: Newly occluded in midsection (new since CABG 2018) 3. LIMA: Atretic 4. Free RIMA to LAD: WIdely patent. LAD is large vessel free of significant stenosis after graft insertion 5. SVG to OM1: Widely patent. Backfills a large portion of the LCX. There is a 70% lesion prior to graft insertion 6. Severe biventricular HF with markedly elevated filling pressures  Micro Data:  COVID 11/28 >> negative  Influenza A/B 11/28 >> negative   Antimicrobials:  11/24 vanc/ zosyn   11/29 Augmentin  >>  Interim history/subjective:  Restless with poor sleep overnight, complaints of discomfort from being in bed today.  Increased Levophed requirement with increasing lactic acid, likely secondary to worsening cardiogenic shock.  -4.7L yesterday and remains volume overloaded   Objective   Blood pressure (!) 82/63, pulse (!) 45, temperature (!) 97.1 F (36.2 C), temperature source Axillary, resp. rate (!) 25, height 5' 6.5" (1.689 m), weight 107 kg, SpO2 (!) 77 %.    FiO2 (%):  [28 %] 28 %   Intake/Output Summary (Last 24 hours) at 10/01/2020 0719 Last data filed at 10/01/2020 0700 Gross per 24 hour  Intake 1559.84 ml  Output 4947 ml  Net -3387.16 ml   Filed Weights   09/12/2020 0500 09/30/20 0500 10/01/20 0600  Weight: 121.4 kg 117.9 kg 107 kg    General:  Obese M, chronically ill appearing M, restless  HEENT: MM pink/moist Neuro: Alert, oriented to place, not clear on details of situation,  following all commands CV: rr, no murmur, LUE AVF +t/b PULM:  Non labored, on 3L [place] with tachypnea and decreased air entry bilateral bases  GI: soft, bs active  Extremities: warm/dry, pre-tibial 1+, chronic lower wound changes, wound vac to left foot Skin: no rashes  Resolved Hospital Problem list     Assessment & Plan:   Witnessed VT Arrest with ischemic cardiomyopathy, CAD s/p CABG 2017 and redo 2018  Biventricular heart failure NYHA IV 2 min CPR in ER, s/p epi x1  R/LHC showing chronic occlusions and newly occluded RCA since CABG 2018, severe biventricular HF  P: - EF worsening, now <20% with massive volume overload, Cardiology and HF team following,  transitioned to CRRT secondary to hypotension. Nephrology maximizing fluid removal as BP will tolerate, however increased Levophed requirement this AM -Very poor overall prognosis, palliative care meeting with patient and wife today.   Pt stated his wish not to have chest compressions and intubation if his heart stops and  wife agrees. Change code status to DNR, further [place] discussion when wife arrives at the hospital today.  -Medical therapy for CAD -Not a candidate for ICD given co-morbidities and prognosis <38yr, palliative consult - Continue Amiodarone, Plavix, Lipitor     Shock, cardiogenic vs septic  Lactic acid up-trending to 8.3 with AGMA, afebrile, started on broad spectrum abx P: -continue Levophed to maintain MAP >65 -Continue Vanc/Zosyn and following cultures   Acute Metabolic Encephalopathy / Accidental Overdose  In setting of narcotic administration for recent amputation  P: - resolved, continue to minimize sedating meds - serial neuro exams   Chronic Hypotension  P: - continue Midodrine 10mg  TID - peripheral NE and CRRT, pressor need down this AM, maintain MAP >60 -unreliable cuff pressures, may need A-line depending on GOC discussion  ESRD, hyperkalemia, AGMA Developed shock after his CABG requiring ECMO and dialysis, TTS schedule  P: -  Nephrology following - requiring CRRT with continued volume overload and increasing Levophed, unlikely pt will be able to tolerate iHD, palliative consult as above  COPD without Exacerbation  OSA  Possible aspiration PNA in the setting of chest compressions Has home CPAP, does not wear  P: -stable respiratory status today -CPAP QHS    -continue Breo - continue Augmentin day 3/x  DM II  Hypoglycemic overnight and started on D10 infusion P: -Hold Lantus, change SSI to very sensitive scale and hold D10 in the setting of volume overload -closely monitor   L Great Toe Amputation  -WOC following   Leukocytosis Pt is afebrile, possibly reactive, no clear source of infection; possible aspiration P: - follow blood cultures, continue Augmentin and check lactic acid  Frequent Falls, Deconditioning  -PT consult -suspect he will need short term rehab before returning home based on recent hx of falls, pending palliative consult, multiple  admits  Best practice (evaluated daily)  Diet: diabetic   Pain/Anxiety/Delirium protocol (if indicated): n/a  VAP protocol (if indicated): n/a  DVT prophylaxis: Lovenox GI prophylaxis: per primary  Glucose control: SSI Mobility: As tolerated  Last date of multidisciplinary goals of care discussion: palliative care meeting today Family and staff present: pending  Summary of discussion: pending Follow up goals of care discussion due: 12/5  Code Status: DNR Disposition: ICU  Labs   CBC: Recent Labs  Lab 09/01/2020 0356 09/24/2020 0923 09/28/20 0042 09/28/20 0042 09/18/2020 0049 09/15/2020 0821 09/26/2020 0827 09/05/2020 0828 09/28/2020 1449 09/30/20 1422 10/01/20 0537  WBC 13.1*  --  18.9*  --  18.4*  --   --   --  14.4* 29.0* 28.5*  NEUTROABS 10.0*  --   --   --   --   --   --   --   --   --   --   HGB 10.9*   < > 10.5*   < > 11.6*   < > 11.9* 12.6* 11.9* 11.3* 11.2*  HCT 33.7*   < > 32.0*   < > 35.2*   < > 35.0* 37.0* 36.6* 34.4* 33.3*  MCV 96.8  --  95.8  --  94.6  --   --   --  95.8 95.6 96.0  PLT 302  --  301  --  296  --   --   --  299 254 252   < > = values in this interval not displayed.    Basic Metabolic Panel: Recent Labs  Lab 09/18/2020 0916 09/09/2020 0923 09/12/2020 1649 09/06/2020 1649 09/28/20 0042 09/28/20 0042 09/24/2020 0049 09/05/2020 0821 09/13/2020 0827 09/03/2020 0828 09/02/2020 1449 09/30/20 0059 09/30/20 1839  NA  --    < > 136   < > 136   < > 134*   < > 134* 134* 135 135 133*  K  --    < > 4.4   < > 4.8   < > 4.5   < > 3.9 4.1 4.4 4.8 5.1  CL  --   --  91*   < > 91*  --  88*  --   --   --  89* 90* 93*  CO2  --   --  24   < > 24  --  23  --   --   --  23 17* 17*  GLUCOSE  --   --  290*   < > 263*  --  197*  --   --   --  119* 67* 124*  BUN  --   --  19   < > 22  --  31*  --   --   --  34* 30* 18  CREATININE  --   --  7.07*   < > 7.41*  --  8.60*  --   --   --  9.02* 7.48* 4.17*  CALCIUM  --   --  10.1   < > 10.1  --  10.4*  --   --   --  10.3 10.1 9.6  MG 2.5*   --  2.5*  --  2.6*  --  2.7*  --   --   --   --  2.9*  --   PHOS  --   --  6.1*  --   --   --   --   --   --   --  6.9* 6.2* 5.0*   < > = values in this interval not displayed.   GFR: Estimated Creatinine Clearance: 18.5 mL/min (A) (by C-G formula based on SCr of 4.17 mg/dL (H)). Recent Labs  Lab 09/26/2020 0049 09/04/2020 1449 09/30/20 1422 09/30/20 1839 10/01/20 0537  WBC 18.4* 14.4* 29.0*  --  28.5*  LATICACIDVEN  --   --  6.8* 8.3*  --     Liver Function Tests: Recent Labs  Lab 09/11/2020 1649 09/28/20 0042 09/25/2020 1449 09/30/20 0059 09/30/20 1839  AST 74* 71*  --   --   --   ALT 45* 50*  --   --   --   ALKPHOS 158* 145*  --   --   --   BILITOT 1.5* 1.3*  --   --   --   PROT 7.6 7.1  --   --   --   ALBUMIN 3.3* 3.2* 3.4* 3.5 3.3*   No results for input(s): LIPASE, AMYLASE in the last 168 hours. No results for input(s): AMMONIA in the last 168 hours.  ABG    Component Value Date/Time   PHART 7.389 09/06/2020 0821   PCO2ART 44.4 09/24/2020 0821   PO2ART 183 (H) 09/21/2020 0821   HCO3 30.5 (H) 09/23/2020 0828   TCO2 32 09/04/2020 0828   O2SAT 60.0 08/31/2020 0828     Coagulation Profile: No results for input(s): INR, PROTIME in the last 168 hours.  Cardiac Enzymes: No results for input(s): CKTOTAL, CKMB, CKMBINDEX, TROPONINI in the last 168 hours.  HbA1C: Hgb A1c MFr Bld  Date/Time Value Ref Range Status  09/23/2020 11:19 AM 7.2 (H) 4.8 - 5.6 % Final    Comment:    (NOTE) Pre diabetes:          5.7%-6.4%  Diabetes:              >6.4%  Glycemic control for   <7.0% adults with diabetes     CBG: Recent Labs  Lab 09/30/20 1116 09/30/20 1549 09/30/20 1948 10/01/20 0027 10/01/20 0508  GLUCAP 73 85 71 100* 94   CRITICAL CARE Performed by: Otilio Carpen Lavone Weisel   Total critical care time: 35 minutes  Critical care time was exclusive of separately billable procedures and treating other patients.  Critical care was necessary to treat or prevent  imminent or life-threatening deterioration.  Critical care was time spent personally by me on the following activities: development of treatment plan with patient and/or surrogate as well as nursing, discussions with consultants, evaluation of patient's response to treatment, examination of patient, obtaining history from patient or  surrogate, ordering and performing treatments and interventions, ordering and review of laboratory studies, ordering and review of radiographic studies, pulse oximetry and re-evaluation of patient's condition.   Otilio Carpen Kelechi Astarita, PA-C [place] [facility]  Pager# [phone], if no answer [phone]

## 2020-10-01 NOTE — Plan of Care (Signed)
°  Problem: Nutrition: Goal: Adequate nutrition will be maintained Outcome: Not Progressing   Problem: Elimination: Goal: Will not experience complications related to bowel motility Outcome: Not Progressing   Poor appetite, very little PO intake.  No bowel movement since 09/24/20 - 7 days.

## 2020-10-01 NOTE — Progress Notes (Signed)
PT Cancellation Note  Patient Details Name: Connor Hoover MRN: [id number] DOB: 09-Nov-1945   Cancelled Treatment:    Reason Eval/Treat Not Completed: Medical issues which prohibited therapy (Pt not appropriate for therapy per nurse. Sign off. )   Denice Paradise 10/01/2020, 11:17 AM Keliah Harned W,PT Acute Rehabilitation Services Pager:  [phone]  Office:  [phone]

## 2020-10-01 NOTE — Progress Notes (Signed)
Patient ID: Foye Damron, male   DOB: 1946/07/20, 74 y.o.   MRN: [id number]    Patient communicated his wishes to primary treatment team; he is tired and hopes now for comfort,  understanding that time is limited.    This NP visited patient at the bedside as a follow up for palliative medicine needs and emotional support.  Patient is able to communicate to me the same.  Patient's wife and pastors at bedside.  Wife verbalizes understanding of the seriousness of the current medical situation and the limited prognosis.  Her hope is for comfort and dignity for her husband at this time.  Comfort orders placed by primary team.  Education offered on the difference between an aggressive medical intervention path and a palliative comfort path for this patient at this time in this situation.  Education offered on the natural trajectory and expectations at end of life.  Questions and concerns addressed.  Prognosis is likely hours to days.  Expect hospital death.  Therapeutic listening and emotional support offered   Discussed with bedside RN                Total time spent on the unit was 35 minutes  Greater than 50% of the time was spent in counseling and coordination of care  Wadie Lessen NP  Palliative Medicine Team Team Phone # [phone] Pager [phone]

## 2020-10-01 NOTE — Progress Notes (Addendum)
[facility] Progress Note   Subjective: Patient says he is miserable and can't go on like this. He says he wants to go home and go back to his hemodialysis unit. He does not understand that his heart is too weak, BP too low for IHD. Very grim situation. SBP 70s cuff pressure on Levo 12 mcg/min. Doubt accuracy of BPs and weights. PCCM to attempt [place] later. Palliative Care has been consulted. Wife needs to be included in all conversations.   Objective Vitals:   10/01/20 0645 10/01/20 0700 10/01/20 0715 10/01/20 0804  BP: (!) 87/42 103/68 (!) 82/63   Pulse:      Resp: (!) 22 (!) 24 (!) 25   Temp:    (!) 95.9 F (35.5 C)  TempSrc:    Axillary  SpO2:      Weight:      Height:       Physical Exam General:Chronically ill appearing male in NAD Heart:HS distant, S1,S2 RRR No M. 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 Lungs:CTAB. No WOB. Abdomen:Active BS Extremities:Trace BLE edema.Venous stasis discoloration BLE. Drsg L foot recent toe amps. Wound vac in plae.  Dialysis Access:L AVF + bruit  Dialysis Orders:  Additional Objective Labs: Basic Metabolic Panel: Recent Labs  Lab 09/30/20 0059 09/30/20 1839 10/01/20 0537  NA 135 133* 136  K 4.8 5.1 5.2*  CL 90* 93* 92*  CO2 17* 17* 15*  GLUCOSE 67* 124* 120*  BUN 30* 18 16  CREATININE 7.48* 4.17* 3.73*  CALCIUM 10.1 9.6 9.8  PHOS 6.2* 5.0* 4.1   Liver Function Tests: Recent Labs  Lab 09/15/2020 1649 09/17/2020 1649 09/28/20 0042 09/16/2020 1449 09/30/20 0059 09/30/20 1839 10/01/20 0537  AST 74*  --  71*  --   --   --   --   ALT 45*  --  50*  --   --   --   --   ALKPHOS 158*  --  145*  --   --   --   --   BILITOT 1.5*  --  1.3*  --   --   --   --   PROT 7.6  --  7.1  --   --   --   --   ALBUMIN 3.3*   < > 3.2*   < > 3.5 3.3* 3.2*   < > = values in this interval not displayed.   No results for input(s): LIPASE, AMYLASE in the last 168 hours. CBC: Recent Labs  Lab 09/28/2020 0356 09/24/2020 0923  09/28/20 0042 09/28/20 0042 09/11/2020 0049 09/17/2020 0821 09/17/2020 1449 09/30/20 1422 10/01/20 0537  WBC 13.1*  --  18.9*   < > 18.4*  --  14.4* 29.0* 28.5*  NEUTROABS 10.0*  --   --   --   --   --   --   --   --   HGB 10.9*   < > 10.5*   < > 11.6*   < > 11.9* 11.3* 11.2*  HCT 33.7*   < > 32.0*   < > 35.2*   < > 36.6* 34.4* 33.3*  MCV 96.8  --  95.8  --  94.6  --  95.8 95.6 96.0  PLT 302  --  301   < > 296  --  299 254 252   < > = values in this interval not displayed.   Blood Culture    Component Value Date/Time   SDES BLOOD RIGHT HAND 08/31/2020 1418  SPECREQUEST  09/23/2020 1418    [place] Blood Culture results may not be optimal due to an inadequate volume of blood received in culture bottles   CULT  09/05/2020 1418    NO GROWTH < 24 HOURS Performed at [facility], [place] [address], [place],  [address]    REPTSTATUS PENDING 09/09/2020 1418    Cardiac Enzymes: No results for input(s): CKTOTAL, CKMB, CKMBINDEX, TROPONINI in the last 168 hours. CBG: Recent Labs  Lab 09/30/20 1549 09/30/20 1948 10/01/20 0027 10/01/20 0508 10/01/20 0757  GLUCAP 85 71 100* 94 130*   Iron Studies: No results for input(s): IRON, TIBC, TRANSFERRIN, FERRITIN in the last 72 hours. @lablastinr3 @ Studies/Results: DG CHEST PORT 1 VIEW  Result Date: 10/01/2020 CLINICAL DATA:  Cardiac enlargement. EXAM: PORTABLE CHEST 1 VIEW COMPARISON:  September 27, 2020. FINDINGS: Stable cardiomegaly. Status post coronary bypass graft. No pneumothorax is noted. Right lung is clear. Stable left basilar atelectasis is noted with possible small left pleural effusion. Bony thorax is unremarkable. IMPRESSION: Stable left basilar atelectasis with possible small left pleural effusion. Electronically Signed   By: Marijo Conception M.D.   On: 10/01/2020 08:23   Medications: .  prismasol BGK 4/2.5 400 mL/hr at 10/01/20 0710  .  prismasol BGK 4/2.5 200 mL/hr at 10/01/20 0111  .  sodium chloride Stopped (09/30/20 0604)  . sodium chloride    . amiodarone 30 mg/hr (10/01/20 0500)  . heparin 10,000 units/ 20 mL infusion syringe 1,150 Units/hr (10/01/20 0605)  . norepinephrine (LEVOPHED) Adult infusion 12 mcg/min (10/01/20 0500)  . piperacillin-tazobactam 3.375 g (10/01/20 0527)  . prismasol BGK 4/2.5 2,000 mL/hr at 10/01/20 6203  . vancomycin Stopped (09/30/20 2054)   . (feeding supplement) PROSource Plus  30 mL Oral BID BM  . ammonium lactate  1 application Topical Daily  . aspirin  81 mg Oral Daily  . atorvastatin  40 mg Oral QHS  . chlorhexidine  15 mL Mouth Rinse BID  . Chlorhexidine Gluconate Cloth  6 each Topical Daily  . cinacalcet  30 mg Oral Q supper  . clopidogrel  75 mg Oral Daily  . fentaNYL (SUBLIMAZE) injection  12.5 mcg Intravenous Once  . fluticasone furoate-vilanterol  1 puff Inhalation Daily  . heparin injection (subcutaneous)  5,000 Units Subcutaneous Q8H  . insulin aspart  0-6 Units Subcutaneous Q4H  . lamoTRIgine  150 mg Oral Daily  . mouth rinse  15 mL Mouth Rinse q12n4p  . midodrine  10 mg Oral TID WC  . multivitamin  1 tablet Oral QHS  . polyethylene glycol  17 g Oral Daily  . senna-docusate  1 tablet Oral QHS  . sevelamer carbonate  1,600 mg Oral TID WC  . sodium chloride flush  10-40 mL Intracatheter Q12H  . sodium chloride flush  3 mL Intravenous Q12H     Dialysis Orders: TTS at [facility] -- full HD yesterday 11/27 4:15hr, 450/800, EDW 116kg, 2K/2Ca, AVF, -heparin 8000units IVbolus TIW -Calcitriol 2.52mcg PO q HD -No ESA  Assessment/Plan: 1. VT arrest with quick ROSC:HxShort run NSVT duringlastadmit. Cardiology following, on amiodaroneIV at 30 mg/hr.  2. Ischemic CM: Repeat ECHO 11/28 estimates EF < 20%. LV with global hypokinesis, G2DD.L & R heart cath today. LVEDP 35, severe BiV failure. Start CRRT and maximize volume removal as tolerated. 3. H/O prior CABG and Redo CABG (2017, 2018). Cardiology  following. 4. ESRD:  CRRT with net hourly UF about 130cc/hr. Now -2632 cc. Heparin at  1050 units/hr. SCr 3.73 K+ 5.2 CO2 15. Adjust CRRT fluids.  5. Hypotension/volume: BP remains soft on Levolphed at 12 mcg/min. Volume removal impeded by hypotension. Uptitrate Levo, continue midodrine. 6. Anemia: Hgb11.2 today- no ESA needed.  7. Metabolic bone disease:C Ca high, PO4 at goal. Unable to swallow binders. This is his lowest priority. Hold binders for now.  8. Nutrition: Albumin on low side, PO intake impeded by nausea. On renal diet fld restrictions. Added protein supps, renal vit.  9. DM- Per primary 10. Accidental OD on Dilaudid-Patient is alert & oriented. Per Primary. 11. Goal of care-No longer able to tolerate IHD. Patient does not fully understand this, believes he can go back to OP clinic and "everything will be fine". Palliative Care consulted for goals of care. Patient has been informed that he can stop all RTT at any given time and we will support him and respect his wishes. Wife is not here this AM. Needs to be involved in all conversations.    Rita H. Brown NP-C 10/01/2020, 8:29 AM  [facility] [phone]  Pt seen, examined and agree w assess/plan as above with additions as indicated. Poor outlook for dialysis for this patient w/ severe heart failure. Palliative care consulted. [facility] 10/01/2020, 9:55 AM

## 2020-10-01 NOTE — Progress Notes (Addendum)
Advanced Heart Failure Rounding Note   Subjective:    Remains on CVVHD. Pulling 100-150 cc/hr. -4.7 L in fluid removal yesterday and remains markedly fluid overloaded. Doubt today's wt is accurate. On 12 of NE for BP support.   No VT on tele. QT prolonged, 560 ms. Has been getting Zofran for nausea/vomitting, which continues to be an issue this morning and his main compliant.   K elevated at 5.2   WBC increased to 29K yesterday, 28K today. Lactic acid 8.3. Started on Vanc + Zosyn. mTemp 97.1. BC NGTD  CXR pending.    Objective:   Weight Range:  Vital Signs:   Temp:  [96.3 F (35.7 C)-97.1 F (36.2 C)] 97.1 F (36.2 C) (12/02 0030) Pulse Rate:  [45-75] 45 (12/02 0615) Resp:  [17-28] 25 (12/02 0715) BP: (63-153)/(18-126) 82/63 (12/02 0715) SpO2:  [56 %-100 %] 77 % (12/02 0615) FiO2 (%):  [28 %] 28 % (12/01 1136) Weight:  [107 kg] 107 kg (12/02 0600) Last BM Date: 09/24/20  Weight change: Filed Weights   08/31/2020 0500 09/30/20 0500 10/01/20 0600  Weight: 121.4 kg 117.9 kg 107 kg    Intake/Output:   Intake/Output Summary (Last 24 hours) at 10/01/2020 0749 Last data filed at 10/01/2020 0700 Gross per 24 hour  Intake 1559.84 ml  Output 4947 ml  Net -3387.16 ml     Physical Exam: General:  Morbidly obese AAM, laying in bed. No resp difficulty HEENT: normal Neck: supple. JVP elevated to level of ear. Carotids 2+ bilat; no bruits. No lymphadenopathy or thryomegaly appreciated. Cor: PMI nondisplaced. RRR. No murmurs, rubs or gallops.  Lungs: clear, no wheezing  Abdomen: obese but soft, nontender, nondistended. No hepatosplenomegaly. No bruits or masses. Good bowel sounds. Extremities: no cyanosis, clubbing, rash, 2+ bilateral LEE up to thigs Neuro: alert & orientedx3, cranial nerves grossly intact. moves all 4 extremities w/o difficulty. Affect pleasant  Telemetry: NSR 70s, no VT  Personally reviewed   Labs: Basic Metabolic Panel: Recent Labs  Lab  09/26/2020 1649 09/26/2020 1649 09/28/20 0042 09/28/20 0042 09/10/2020 0049 09/20/2020 0049 08/31/2020 0821 09/23/2020 0828 09/20/2020 1449 09/06/2020 1449 09/30/20 0059 09/30/20 1839 10/01/20 0537  NA 136   < > 136   < > 134*  --    < > 134* 135  --  135 133* 136  K 4.4   < > 4.8   < > 4.5  --    < > 4.1 4.4  --  4.8 5.1 5.2*  CL 91*   < > 91*   < > 88*  --   --   --  89*  --  90* 93* 92*  CO2 24   < > 24   < > 23  --   --   --  23  --  17* 17* 15*  GLUCOSE 290*   < > 263*   < > 197*  --   --   --  119*  --  67* 124* 120*  BUN 19   < > 22   < > 31*  --   --   --  34*  --  30* 18 16  CREATININE 7.07*   < > 7.41*   < > 8.60*  --   --   --  9.02*  --  7.48* 4.17* 3.73*  CALCIUM 10.1   < > 10.1   < > 10.4*   < >  --   --  10.3   < >  10.1 9.6 9.8  MG 2.5*  --  2.6*  --  2.7*  --   --   --   --   --  2.9*  --  2.7*  PHOS 6.1*  --   --   --   --   --   --   --  6.9*  --  6.2* 5.0* 4.1   < > = values in this interval not displayed.    Liver Function Tests: Recent Labs  Lab 09/02/2020 1649 09/05/2020 1649 09/28/20 0042 09/20/2020 1449 09/30/20 0059 09/30/20 1839 10/01/20 0537  AST 74*  --  71*  --   --   --   --   ALT 45*  --  50*  --   --   --   --   ALKPHOS 158*  --  145*  --   --   --   --   BILITOT 1.5*  --  1.3*  --   --   --   --   PROT 7.6  --  7.1  --   --   --   --   ALBUMIN 3.3*   < > 3.2* 3.4* 3.5 3.3* 3.2*   < > = values in this interval not displayed.   No results for input(s): LIPASE, AMYLASE in the last 168 hours. No results for input(s): AMMONIA in the last 168 hours.  CBC: Recent Labs  Lab 09/21/2020 0356 09/12/2020 0923 09/28/20 0042 09/28/20 0042 09/26/2020 0049 09/12/2020 0821 09/18/2020 0827 09/06/2020 0828 09/20/2020 1449 09/30/20 1422 10/01/20 0537  WBC 13.1*  --  18.9*  --  18.4*  --   --   --  14.4* 29.0* 28.5*  NEUTROABS 10.0*  --   --   --   --   --   --   --   --   --   --   HGB 10.9*   < > 10.5*   < > 11.6*   < > 11.9* 12.6* 11.9* 11.3* 11.2*  HCT 33.7*   < > 32.0*    < > 35.2*   < > 35.0* 37.0* 36.6* 34.4* 33.3*  MCV 96.8  --  95.8  --  94.6  --   --   --  95.8 95.6 96.0  PLT 302  --  301  --  296  --   --   --  299 254 252   < > = values in this interval not displayed.    Cardiac Enzymes: No results for input(s): CKTOTAL, CKMB, CKMBINDEX, TROPONINI in the last 168 hours.  BNP: BNP (last 3 results) Recent Labs    09/21/2020 0356  BNP 1,039.7*    ProBNP (last 3 results) No results for input(s): PROBNP in the last 8760 hours.    Other results:  Imaging: CARDIAC CATHETERIZATION  Result Date: 09/17/2020  Ost LM lesion is 100% stenosed.  Prox RCA to Mid RCA lesion is 100% stenosed.  1st Mrg lesion is 70% stenosed.  RIMA.  Prox RCA-1 lesion is 30% stenosed.  Previously placed Prox RCA-2 stent (unknown type) is widely patent.  Findings: Ao = 99/63 (77) LV = 100/35 RA =  32 RV = 60/26 PA = 66/23 (43) PCW = 35 Fick cardiac output/index = 4.7/2.1 PVR = 1.7 WU PAPi = 1.3 FA sat =100% PA sat = 57%, 60% Assessment: 1. LM: Totally occluded ostially (chronic) 2. RCA: Newly occluded in midsection (new since CABG 2018) 3. LIMA: Atretic 4. Free  RIMA to LAD: WIdely patent. LAD is large vessel free of significant stenosis after graft insertion 5. SVG to OM1: Widely patent. Backfills a large portion of the LCX. There is a 70% lesion prior to graft insertion 6. Severe biventricular HF with markedly elevated filling pressures Plan/Discussion: Continue medical therapy. Will need HD today. [name], MD 9:10 AM     Medications:     Scheduled Medications: . (feeding supplement) PROSource Plus  30 mL Oral BID BM  . ammonium lactate  1 application Topical Daily  . aspirin  81 mg Oral Daily  . atorvastatin  40 mg Oral QHS  . chlorhexidine  15 mL Mouth Rinse BID  . Chlorhexidine Gluconate Cloth  6 each Topical Daily  . cinacalcet  30 mg Oral Q supper  . clopidogrel  75 mg Oral Daily  . fentaNYL (SUBLIMAZE) injection  12.5 mcg Intravenous Once  .  fluticasone furoate-vilanterol  1 puff Inhalation Daily  . heparin injection (subcutaneous)  5,000 Units Subcutaneous Q8H  . insulin aspart  0-6 Units Subcutaneous Q4H  . lamoTRIgine  150 mg Oral Daily  . mouth rinse  15 mL Mouth Rinse q12n4p  . midodrine  10 mg Oral TID WC  . multivitamin  1 tablet Oral QHS  . polyethylene glycol  17 g Oral Daily  . senna-docusate  1 tablet Oral QHS  . sevelamer carbonate  1,600 mg Oral TID WC  . sodium chloride flush  10-40 mL Intracatheter Q12H  . sodium chloride flush  3 mL Intravenous Q12H    Infusions: .  prismasol BGK 4/2.5 400 mL/hr at 10/01/20 0710  .  prismasol BGK 4/2.5 200 mL/hr at 10/01/20 0111  . sodium chloride Stopped (09/30/20 0604)  . sodium chloride    . amiodarone 30 mg/hr (10/01/20 0500)  . heparin 10,000 units/ 20 mL infusion syringe 1,150 Units/hr (10/01/20 0605)  . norepinephrine (LEVOPHED) Adult infusion 12 mcg/min (10/01/20 0500)  . piperacillin-tazobactam 3.375 g (10/01/20 0527)  . prismasol BGK 4/2.5 2,000 mL/hr at 10/01/20 8889  . vancomycin Stopped (09/30/20 2054)    PRN Medications: Place/Maintain arterial line **AND** sodium chloride, acetaminophen, albuterol, alteplase, heparin, heparin, hydrocerin, ipratropium-albuterol, ondansetron (ZOFRAN) IV, sodium chloride flush   Assessment/Plan:   1. 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 Septic Shock  - Echo LVEF < 20% RV moderately HK - RHC cath 11/30. CVP 31 and PCWP 35. CI 2.1 (PA sat 59%) PaPi 1.3 - NYHA IV - Massively volume overloaded and unable to pull fluid with HD due to hypotension. Now on CVVHD. Still overloaded.  Using NE to support BP. Need to pull more if possible.  - Given severity of his HF, I am remain very concerned about his ability to be maintained on long-term HD - Will ask Palliative Care to get involved to determine [place], consult pending   2. ESRD on HD - As above massively volume overloaded and unable to  pull fluid with HD due to hypotension - Now on NE 12 and CVVHD. Need to pull more if possible.  - Given severity of his HF, I am remain very concerned about his ability to be maintained on long-term HD   3. VT arrest - in setting of respiratory failure and accidental dilaudid overdose - rhythm stable on amio gtt. Given n/w will continue on gtt for now. Need to watch carefully in setting of trifascicualr block - not ICD candidate given limited progrnosis  4. 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 RIMA to LAD and SVG to OM patent - medical therapy - no current s/s angina  5. PAD - s/p recent amputation  6. Hyperkalemia - K 5.2 - on CVVH   7. QT prolongation - on amio gtt for recent VT - has been getting zofran for n/v. Switch to compazine   8. ? Component of Septic Shock  - WBC 28K, lactic acid 8.3.   - AF. mTemp 97.1 - continue empiric Vanc + zosyn  - BC NGTD - CXR pending     Length of Stay: 4   Brittainy Simmons  PA-C 10/01/2020, 7:49 AM  Advanced Heart Failure Team Pager [phone] (M-F; 7a - 4p)  Please contact [facility] for night-coverage after hours (4p -7a ) and weekends on [email or web address]  Agree with above.   He is miserable this am. Very uncomfortable. Lactate rising. Now 8.3. Hypotensive despite NE. WBC 29k  General: Obese male lying in bed. Uncomfortable. HEENT: normal Neck: supple. JVP hard to see. Carotids 2+ bilat; no bruits. No lymphadenopathy or thryomegaly appreciated. Cor: PMI nondisplaced. Regular rate & rhythm. No rubs, gallops or murmurs. Lungs: clear anteriorly Abdomen: obese soft, nontender, nondistended. No hepatosplenomegaly. No bruits or masses. Good bowel sounds. Extremities: no cyanosis, clubbing, rash, 1+ edema Neuro: alert & orientedx3, cranial nerves grossly intact. moves all 4 extremities w/o difficulty. Affect pleasant  He appears septic on top of end-stage HF and ESRD. Lactate now 8.3. Given severe biventricular  HF and inability to dialyze as an outpatient his long term options for meaningful survival are almost non-existent. Agree with broad spectrum abx and titrating pressors for now but suspect best thing we can do for him is transition to comfort care.  CRITICAL CARE Performed by: [name]  Total critical care time: 35 minutes  Critical care time was exclusive of separately billable procedures and treating other patients.  Critical care was necessary to treat or prevent imminent or life-threatening deterioration.  Critical care was time spent personally by me (independent of midlevel providers or residents) on the following activities: development of treatment plan with patient and/or surrogate as well as nursing, discussions with consultants, evaluation of patient's response to treatment, examination of patient, obtaining history from patient or surrogate, ordering and performing treatments and interventions, ordering and review of laboratory studies, ordering and review of radiographic studies, pulse oximetry and re-evaluation of patient's condition.  [name], MD  8:59 AM

## 2020-10-01 NOTE — Progress Notes (Signed)
Notified [facility] of pt.  Referral # [id number].  Spoke with Deon.  Nothing to do at this time, but will call with cardiac TOD. Info documented in doc flowsheet as well.

## 2020-10-04 LAB — CULTURE, BLOOD (ROUTINE X 2)
Culture: NO GROWTH
Culture: NO GROWTH

## 2020-10-05 LAB — CULTURE, BLOOD (ROUTINE X 2)
Culture: NO GROWTH
Culture: NO GROWTH

## 2020-10-31 NOTE — Progress Notes (Signed)
Patient arrived to room [phone], limited assessment performed due to wife request. Breathing currently labored, and shallow. Morphine drip infusing with patient resting comfortably. Wound vac remains in place to LLE. Will continue to monitor.

## 2020-10-31 NOTE — Death Summary Note (Addendum)
DEATH SUMMARY   Patient Details  Name: Connor Hoover MRN: [id number] DOB: 1946-01-29  Admission/Discharge Information   Admit Date:  18-Oct-2020  Date of Death: Date of Death: 2020-10-23  Time of Death: Time of Death: 02-06-14  Length of Stay: 5  Referring Physician: [facility] [place]   Reason(s) for Hospitalization  Accidental drug overdose  Diagnoses  Preliminary cause of death: Cardiogenic shock secondary to acute on chronic biventricular heart failure Secondary Diagnoses (including complications and co-morbidities): VT arrest, end stage renal disease, septic shock Principal Problem:   Acute exacerbation of CHF (congestive heart failure) ([place]) Active Problems:   Ventricular tachycardia ([place])   Accidental drug overdose   COPD exacerbation ([place])   ESRD (end stage renal disease) ([place])   Hypotension   Cardiogenic shock ([place])   Brief Hospital Course (including significant findings, care, treatment, and services provided and events leading to death)  Connor Hoover is a 75 y.o. year old male with ESRD, hx CAD s/p PCI to mLAD and CABG, chronic combined heart failure, recent osteomyelitis s/p left great toe amputation with wound vac who initially presented for accidental narcotic overdose. In the ED he went into VT arrest with ROSC after 2 minutes with ACLS. Hospital course was complicated by cardiogenic shock. Due to shock patient was unable to tolerate hemodialysis for fluid removaland required CRRT with vasopressor support. Palliative discussion held regarding patient's end stage heart failure. Decision was made to transition patient to comfort care. He expired on 2020/10/23 at 10:15 am.  Pertinent Labs and Studies  Significant Diagnostic Studies CARDIAC CATHETERIZATION  Result Date: 09/26/2020  Ost LM lesion is 100% stenosed.  Prox RCA to Mid RCA lesion is 100% stenosed.  1st Mrg lesion is 70% stenosed.  RIMA.  Prox RCA-1 lesion is 30% stenosed.  Previously placed Prox  RCA-2 stent (unknown type) is widely patent.  Findings: Ao = 99/63 (77) LV = 100/35 RA =  32 RV = 60/26 PA = 66/23 (43) PCW = 35 Fick cardiac output/index = 4.7/2.1 PVR = 1.7 WU PAPi = 1.3 FA sat =100% PA sat = 57%, 60% Assessment: 1. LM: Totally occluded ostially (chronic) 2. RCA: Newly occluded in midsection (new since CABG 2017/02/06) 3. LIMA: Atretic 4. Free RIMA to LAD: WIdely patent. LAD is large vessel free of significant stenosis after graft insertion 5. SVG to OM1: Widely patent. Backfills a large portion of the LCX. There is a 70% lesion prior to graft insertion 6. Severe biventricular HF with markedly elevated filling pressures Plan/Discussion: Continue medical therapy. Will need HD today. [name], MD 9:10 AM   DG CHEST PORT 1 VIEW  Result Date: 10/01/2020 CLINICAL DATA:  Cardiac enlargement. EXAM: PORTABLE CHEST 1 VIEW COMPARISON:  18-Oct-2020. FINDINGS: Stable cardiomegaly. Status post coronary bypass graft. No pneumothorax is noted. Right lung is clear. Stable left basilar atelectasis is noted with possible small left pleural effusion. Bony thorax is unremarkable. IMPRESSION: Stable left basilar atelectasis with possible small left pleural effusion. Electronically Signed   By: Marijo Conception M.D.   On: 10/01/2020 08:23   DG Chest Port 1 View  Result Date: 10/18/2020 CLINICAL DATA:  Shortness of breath EXAM: PORTABLE CHEST 1 VIEW COMPARISON:  10/18/2020 FINDINGS: Postoperative changes in the mediastinum. Cardiac enlargement. Improving vascular congestion. Atelectasis in the left lung base with possible small left pleural effusion. No progression. IMPRESSION: Cardiac enlargement with improving vascular congestion. Atelectasis in the left base with possible small left pleural effusion, unchanged. Electronically Signed  By: Lucienne Capers M.D.   On: 09/21/2020 06:34   DG Chest Port 1 View  Result Date: 09/12/2020 CLINICAL DATA:  Shortness of breath.  Drug overdose. EXAM: PORTABLE  CHEST 1 VIEW COMPARISON:  09/23/2020 FINDINGS: Postoperative changes in the mediastinum. Diffuse cardiac enlargement. Mild vascular congestion. No edema or consolidation. Suggestion of atelectasis in the left lung base. Possible pleural effusion or thickening in the costophrenic angles. IMPRESSION: Cardiac enlargement with mild vascular congestion. No edema or consolidation. Atelectasis in the left lung base. Possible small pleural effusions or thickening in the costophrenic angles. Electronically Signed   By: Lucienne Capers M.D.   On: 09/21/2020 04:14   DG CHEST PORT 1 VIEW  Result Date: 09/23/2020 CLINICAL DATA:  75 year old male with shortness of breath. EXAM: PORTABLE CHEST 1 VIEW COMPARISON:  Chest radiograph dated 05/31/2020 FINDINGS: Interval removal the right IJ dialysis catheter. There is cardiomegaly with vascular congestion. Small left pleural effusion and left lung base atelectasis or infiltrate. Overall interval improved aeration of the lungs and near complete resolution of the previously seen edema. No pneumothorax. Median sternotomy wires and CABG vascular clips. No acute osseous pathology. IMPRESSION: 1. Cardiomegaly with vascular congestion. Interval resolution of the edema. 2. Small left pleural effusion and left lung base atelectasis or infiltrate. Electronically Signed   By: Anner Crete M.D.   On: 09/23/2020 17:17   ECHOCARDIOGRAM COMPLETE  Result Date: 09/12/2020    ECHOCARDIOGRAM REPORT   Patient Name:   Connor Hoover Date of Exam: 09/28/2020 Medical Rec #:  [id number]       Height:       66.5 in Accession #:    [id number]      Weight:       277.6 lb Date of Birth:  1946/03/16        BSA:          2.312 m Patient Age:    69 years        BP:           100/62 mmHg Patient Gender: M               HR:           74 bpm. Exam Location:  Inpatient Procedure: 2D Echo, Cardiac Doppler, Color Doppler and Intracardiac            Opacification Agent Indications:    R06.02 SOB  History:         Patient has prior history of Echocardiogram examinations, most                 recent 11/16/2009. Arrythmias:Cardiac Arrest;                 Signs/Symptoms:Hypotension.  Sonographer:    Merrie Roof RDCS Referring Phys: [id number] Raynaldo Opitz RAINES IMPRESSIONS  1. No mural thrombus with Definity contrast. Left ventricular ejection fraction, by estimation, is <20%. The left ventricle has severely decreased function. The left ventricle demonstrates global hypokinesis. The left ventricular internal cavity size was moderately dilated. Left ventricular diastolic parameters are consistent with Grade II diastolic dysfunction (pseudonormalization). Elevated left ventricular end-diastolic pressure.  2. Right ventricular systolic function is mildly reduced. The right ventricular size is normal. There is normal pulmonary artery systolic pressure.  3. Left atrial size was moderately dilated.  4. Right atrial size was severely dilated.  5. ? may be some degree of MS - aliasing noted during diastole in the LV suggesting restricted diastolic filling. The mitral valve  was not well visualized. No evidence of mitral valve regurgitation. Moderate mitral annular calcification.  6. The aortic valve is tricuspid. Aortic valve regurgitation is not visualized.  7. Aortic dilatation noted. There is borderline dilatation of the ascending aorta, measuring 38 mm.  8. The inferior vena cava is dilated in size with <50% respiratory variability, suggesting right atrial pressure of 15 mmHg. FINDINGS  Left Ventricle: No mural thrombus with Definity contrast. Left ventricular ejection fraction, by estimation, is <20%. The left ventricle has severely decreased function. The left ventricle demonstrates global hypokinesis. Definity contrast agent was given IV to delineate the left ventricular endocardial borders. The left ventricular internal cavity size was moderately dilated. There is no left ventricular hypertrophy. Left ventricular diastolic  parameters are consistent with Grade II diastolic dysfunction (pseudonormalization). Elevated left ventricular end-diastolic pressure. Right Ventricle: The right ventricular size is normal. No increase in right ventricular wall thickness. Right ventricular systolic function is mildly reduced. There is normal pulmonary artery systolic pressure. The tricuspid regurgitant velocity is 1.86 m/s, and with an assumed right atrial pressure of 15 mmHg, the estimated right ventricular systolic pressure is 46.5 mmHg. Left Atrium: Left atrial size was moderately dilated. Right Atrium: Right atrial size was severely dilated. Pericardium: There is no evidence of pericardial effusion. Mitral Valve: ? may be some degree of MS - aliasing noted during diastole in the LV suggesting restricted diastolic filling. The mitral valve was not well visualized. Moderate mitral annular calcification. No evidence of mitral valve regurgitation. Tricuspid Valve: The tricuspid valve is grossly normal. Tricuspid valve regurgitation is mild. Aortic Valve: The aortic valve is tricuspid. Aortic valve regurgitation is not visualized. Pulmonic Valve: The pulmonic valve was grossly normal. Pulmonic valve regurgitation is trivial. Aorta: Aortic dilatation noted. There is borderline dilatation of the ascending aorta, measuring 38 mm. Venous: The inferior vena cava is dilated in size with less than 50% respiratory variability, suggesting right atrial pressure of 15 mmHg. IAS/Shunts: The interatrial septum was not well visualized.  LEFT VENTRICLE PLAX 2D LVIDd:         6.20 cm      Diastology LVIDs:         5.50 cm      LV e' medial:    3.70 cm/s LV PW:         0.90 cm      LV E/e' medial:  24.9 LV IVS:        0.90 cm      LV e' lateral:   7.62 cm/s LVOT diam:     2.20 cm      LV E/e' lateral: 12.1 LV SV:         49 LV SV Index:   21 LVOT Area:     3.80 cm  LV Volumes (MOD) LV vol d, MOD A2C: 146.0 ml LV vol d, MOD A4C: 177.0 ml LV vol s, MOD A2C: 90.0 ml LV  vol s, MOD A4C: 148.0 ml LV SV MOD A2C:     56.0 ml LV SV MOD A4C:     177.0 ml LV SV MOD BP:      46.2 ml RIGHT VENTRICLE          IVC RV Basal diam:  4.30 cm  IVC diam: 2.20 cm RV Mid diam:    4.10 cm TAPSE (M-mode): 1.3 cm LEFT ATRIUM             Index       RIGHT ATRIUM  Index LA diam:        4.40 cm 1.90 cm/m  RA Area:     31.80 cm LA Vol (A2C):   89.2 ml 38.59 ml/m RA Volume:   121.00 ml 52.35 ml/m LA Vol (A4C):   88.9 ml 38.46 ml/m LA Biplane Vol: 94.6 ml 40.92 ml/m  AORTIC VALVE LVOT Vmax:   68.50 cm/s LVOT Vmean:  42.600 cm/s LVOT VTI:    0.129 m  AORTA Ao Root diam: 3.20 cm Ao Asc diam:  3.80 cm MITRAL VALVE               TRICUSPID VALVE MV Area (PHT): 4.26 cm    TR Peak grad:   13.8 mmHg MV Decel Time: 178 msec    TR Vmax:        186.00 cm/s MV E velocity: 92.10 cm/s MV A velocity: 85.30 cm/s  SHUNTS MV E/A ratio:  1.08        Systemic VTI:  0.13 m                            Systemic Diam: 2.20 cm Lyman Bishop MD Electronically signed by Lyman Bishop MD Signature Date/Time: 09/17/2020/4:15:22 PM    Final     Microbiology Recent Results (from the past 240 hour(s))  Respiratory Panel by RT PCR (Flu A&B, Covid) - Nasopharyngeal Swab     Status: None   Collection Time: 09/23/20 11:22 AM   Specimen: Nasopharyngeal Swab; Nasopharyngeal(NP) swabs in vial transport medium  Result Value Ref Range Status   SARS Coronavirus 2 by RT PCR NEGATIVE NEGATIVE Final    Comment: (NOTE) SARS-CoV-2 target nucleic acids are NOT DETECTED.  The SARS-CoV-2 RNA is generally detectable in upper respiratoy specimens during the acute phase of infection. The lowest concentration of SARS-CoV-2 viral copies this assay can detect is 131 copies/mL. A negative result does not preclude SARS-Cov-2 infection and should not be used as the sole basis for treatment or other patient management decisions. A negative result may occur with  improper specimen collection/handling, submission of specimen other than  nasopharyngeal swab, presence of viral mutation(s) within the areas targeted by this assay, and inadequate number of viral copies (<131 copies/mL). A negative result must be combined with clinical observations, patient history, and epidemiological information. The expected result is Negative.  Fact Sheet for Patients:  PinkCheek.be  Fact Sheet for Healthcare Providers:  GravelBags.it  This test is no t yet approved or cleared by the [place] FDA and  has been authorized for detection and/or diagnosis of SARS-CoV-2 by FDA under an Emergency Use Authorization (EUA). This EUA will remain  in effect (meaning this test can be used) for the duration of the COVID-19 declaration under Section 564(b)(1) of the Act, 21 U.S.C. section 360bbb-3(b)(1), unless the authorization is terminated or revoked sooner.     Influenza A by PCR NEGATIVE NEGATIVE Final   Influenza B by PCR NEGATIVE NEGATIVE Final    Comment: (NOTE) The Xpert Xpress SARS-CoV-2/FLU/RSV assay is intended as an aid in  the diagnosis of influenza from Nasopharyngeal swab specimens and  should not be used as a sole basis for treatment. Nasal washings and  aspirates are unacceptable for Xpert Xpress SARS-CoV-2/FLU/RSV  testing.  Fact Sheet for Patients: PinkCheek.be  Fact Sheet for Healthcare Providers: GravelBags.it  This test is not yet approved or cleared by the [place] FDA and  has been authorized for detection and/or diagnosis  of SARS-CoV-2 by  FDA under an Emergency Use Authorization (EUA). This EUA will remain  in effect (meaning this test can be used) for the duration of the  Covid-19 declaration under Section 564(b)(1) of the Act, 21  U.S.C. section 360bbb-3(b)(1), unless the authorization is  terminated or revoked. Performed at [facility], [place] [address], [place],  [place] [address]   MRSA PCR Screening     Status: None   Collection Time: 09/24/20  1:11 AM   Specimen: Nasal Mucosa; Nasopharyngeal  Result Value Ref Range Status   MRSA by PCR NEGATIVE NEGATIVE Final    Comment:        The GeneXpert MRSA Assay (FDA approved for NASAL specimens only), is one component of a comprehensive MRSA colonization surveillance program. It is not intended to diagnose MRSA infection nor to guide or monitor treatment for MRSA infections. Performed at [facility], [place] [address], [place], [place] [address]   Resp Panel by RT-PCR (Flu A&B, Covid) Nasopharyngeal Swab     Status: None   Collection Time: 09/20/2020  4:30 AM   Specimen: Nasopharyngeal Swab; Nasopharyngeal(NP) swabs in vial transport medium  Result Value Ref Range Status   SARS Coronavirus 2 by RT PCR NEGATIVE NEGATIVE Final    Comment: (NOTE) SARS-CoV-2 target nucleic acids are NOT DETECTED.  The SARS-CoV-2 RNA is generally detectable in upper respiratory specimens during the acute phase of infection. The lowest concentration of SARS-CoV-2 viral copies this assay can detect is 138 copies/mL. A negative result does not preclude SARS-Cov-2 infection and should not be used as the sole basis for treatment or other patient management decisions. A negative result may occur with  improper specimen collection/handling, submission of specimen other than nasopharyngeal swab, presence of viral mutation(s) within the areas targeted by this assay, and inadequate number of viral copies(<138 copies/mL). A negative result must be combined with clinical observations, patient history, and epidemiological information. The expected result is Negative.  Fact Sheet for Patients:  EntrepreneurPulse.com.au  Fact Sheet for Healthcare Providers:  IncredibleEmployment.be  This test is no t yet approved or cleared by the [place] FDA and  has been authorized for detection  and/or diagnosis of SARS-CoV-2 by FDA under an Emergency Use Authorization (EUA). This EUA will remain  in effect (meaning this test can be used) for the duration of the COVID-19 declaration under Section 564(b)(1) of the Act, 21 U.S.C.section 360bbb-3(b)(1), unless the authorization is terminated  or revoked sooner.       Influenza A by PCR NEGATIVE NEGATIVE Final   Influenza B by PCR NEGATIVE NEGATIVE Final    Comment: (NOTE) The Xpert Xpress SARS-CoV-2/FLU/RSV plus assay is intended as an aid in the diagnosis of influenza from Nasopharyngeal swab specimens and should not be used as a sole basis for treatment. Nasal washings and aspirates are unacceptable for Xpert Xpress SARS-CoV-2/FLU/RSV testing.  Fact Sheet for Patients: EntrepreneurPulse.com.au  Fact Sheet for Healthcare Providers: IncredibleEmployment.be  This test is not yet approved or cleared by the [place] FDA and has been authorized for detection and/or diagnosis of SARS-CoV-2 by FDA under an Emergency Use Authorization (EUA). This EUA will remain in effect (meaning this test can be used) for the duration of the COVID-19 declaration under Section 564(b)(1) of the Act, 21 U.S.C. section 360bbb-3(b)(1), unless the authorization is terminated or revoked.  Performed at [facility], [place] [address], [place], [place] [address]   Culture, blood (routine x 2)  Status: None (Preliminary result)   Collection Time: 09/20/2020  2:16 PM   Specimen: BLOOD LEFT HAND  Result Value Ref Range Status   Specimen Description BLOOD LEFT HAND  Final   Special Requests   Final    BOTTLES DRAWN AEROBIC AND ANAEROBIC Blood Culture results may not be optimal due to an inadequate volume of blood received in culture bottles   Culture   Final    NO GROWTH 3 DAYS Performed at [facility], [place] [address], [place], [place] [address]    Report Status PENDING  Incomplete  Culture, blood  (routine x 2)     Status: None (Preliminary result)   Collection Time: 09/18/2020  2:18 PM   Specimen: BLOOD RIGHT HAND  Result Value Ref Range Status   Specimen Description BLOOD RIGHT HAND  Final   Special Requests   Final    BOTTLES DRAWN AEROBIC AND ANAEROBIC Blood Culture results may not be optimal due to an inadequate volume of blood received in culture bottles   Culture   Final    NO GROWTH 3 DAYS Performed at [facility], [place] [address] [place], [place] [address]    Report Status PENDING  Incomplete  Culture, blood (routine x 2)     Status: None (Preliminary result)   Collection Time: 09/30/20  7:28 PM   Specimen: BLOOD RIGHT FOREARM  Result Value Ref Range Status   Specimen Description BLOOD RIGHT FOREARM  Final   Special Requests   Final    AEROBIC BOTTLE ONLY Blood Culture results may not be optimal due to an inadequate volume of blood received in culture bottles   Culture   Final    NO GROWTH 2 DAYS Performed at [facility], [place] [address], [place], [place] [address]    Report Status PENDING  Incomplete  Culture, blood (routine x 2)     Status: None (Preliminary result)   Collection Time: 09/30/20  7:37 PM   Specimen: BLOOD RIGHT HAND  Result Value Ref Range Status   Specimen Description BLOOD RIGHT HAND  Final   Special Requests   Final    AEROBIC BOTTLE ONLY Blood Culture results may not be optimal due to an inadequate volume of blood received in culture bottles   Culture   Final    NO GROWTH 2 DAYS Performed at [facility], [place] [address], [place], [place] [address]    Report Status PENDING  Incomplete    Lab Basic Metabolic Panel: Recent Labs  Lab 09/22/2020 1649 09/26/2020 1649 09/28/20 0042 09/28/20 0042 09/11/2020 0049 09/28/2020 0821 09/25/2020 0828 09/24/2020 1449 09/30/20 0059 09/30/20 1839 10/01/20 0537  NA 136   < > 136   < > 134*   < > 134* 135 135 133* 136  K 4.4   < > 4.8   < > 4.5   < > 4.1 4.4 4.8 5.1 5.2*  CL 91*   < > 91*    < > 88*  --   --  89* 90* 93* 92*  CO2 24   < > 24   < > 23  --   --  23 17* 17* 15*  GLUCOSE 290*   < > 263*   < > 197*  --   --  119* 67* 124* 120*  BUN 19   < > 22   < > 31*  --   --  34* 30* 18 16  CREATININE 7.07*   < >  7.41*   < > 8.60*  --   --  9.02* 7.48* 4.17* 3.73*  CALCIUM 10.1   < > 10.1   < > 10.4*  --   --  10.3 10.1 9.6 9.8  MG 2.5*  --  2.6*  --  2.7*  --   --   --  2.9*  --  2.7*  PHOS 6.1*  --   --   --   --   --   --  6.9* 6.2* 5.0* 4.1   < > = values in this interval not displayed.   Liver Function Tests: Recent Labs  Lab 09/22/2020 1649 09/02/2020 1649 09/28/20 0042 09/19/2020 1449 09/30/20 0059 09/30/20 1839 10/01/20 0537  AST 74*  --  71*  --   --   --   --   ALT 45*  --  50*  --   --   --   --   ALKPHOS 158*  --  145*  --   --   --   --   BILITOT 1.5*  --  1.3*  --   --   --   --   PROT 7.6  --  7.1  --   --   --   --   ALBUMIN 3.3*   < > 3.2* 3.4* 3.5 3.3* 3.2*   < > = values in this interval not displayed.   No results for input(s): LIPASE, AMYLASE in the last 168 hours. No results for input(s): AMMONIA in the last 168 hours. CBC: Recent Labs  Lab 09/02/2020 0356 09/20/2020 0923 09/28/20 0042 09/28/20 0042 09/14/2020 0049 09/15/2020 0821 09/20/2020 0827 09/07/2020 0828 09/20/2020 1449 09/30/20 1422 10/01/20 0537  WBC 13.1*  --  18.9*  --  18.4*  --   --   --  14.4* 29.0* 28.5*  NEUTROABS 10.0*  --   --   --   --   --   --   --   --   --   --   HGB 10.9*   < > 10.5*   < > 11.6*   < > 11.9* 12.6* 11.9* 11.3* 11.2*  HCT 33.7*   < > 32.0*   < > 35.2*   < > 35.0* 37.0* 36.6* 34.4* 33.3*  MCV 96.8  --  95.8  --  94.6  --   --   --  95.8 95.6 96.0  PLT 302  --  301  --  296  --   --   --  299 254 252   < > = values in this interval not displayed.   Cardiac Enzymes: No results for input(s): CKTOTAL, CKMB, CKMBINDEX, TROPONINI in the last 168 hours. Sepsis Labs: Recent Labs  Lab 08/31/2020 0049 09/28/2020 1449 09/30/20 1422 09/30/20 1839 10/01/20 0537  WBC  18.4* 14.4* 29.0*  --  28.5*  LATICACIDVEN  --   --  6.8* 8.3*  --     Procedures/Operations  09/07/2020 ACLS  Susanne Baumgarner Rodman Pickle Oct 30, 2020, 1:16 PM

## 2020-10-31 NOTE — Progress Notes (Signed)
Pt moved to comfort care now.  Will sign off.    Kelly Splinter, MD 10/26/2020, 8:20 AM

## 2020-10-31 NOTE — Progress Notes (Signed)
[place] Progress Note Patient Name: Connor Hoover DOB: 05-02-1946 MRN: [id number]   Date of Service  Oct 14, 2020  HPI/Events of Note  Patient on comfort measures. Nursing request for transfer orders to Palliative Care bed to make a bed for an acute STEMI patient and change analgesia and anxiety medication to Morphine IV infusion and Versed IV PRN.   eICU Interventions  Plan: 1. D?C Fentanyl and Versed IV infusions.  2. Morphine IV infusion. Titrate to patient comfort.  3. Morphine 2 mg bolus from infusion Q 30 minutes PRN pain or discomfort.  4. Versed 2 mg IV Q 1 hour PRN anxiety. 5. Transfer to Palliative Care bed.      Intervention Category Major Interventions: Other:  Graceanne Guin Cornelia Copa 2020/10/14, 12:00 AM

## 2020-10-31 NOTE — Progress Notes (Signed)
NAME:  Connor Hoover, MRN:  [id number], DOB:  April 23, 1946, LOS: 5 ADMISSION DATE:  09/17/2020, CONSULTATION DATE:  11/28 REFERRING MD:  Dr. Christy Gentles, CHIEF COMPLAINT:  Cardiac Arrest    Brief History   75 y/o M who presented for AMS 11/28 after dosing of dilaudid for dressing change. Was admitted for evaluation by IMTS.  Developed 2 min VT arrest in ER with ROSC and return of mental status.   History of present illness   75 y/o M who presented to [facility] on 11/28 for AMS.   The patient is a poor historian but his wife provides detailed information.  He is followed at the [facility].  The patient developed what he thought was an in-grown toenail and it became infected requiring admission from 09/16/20 through 11/21 at the [place].  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 He was also identified to have worsening LVEF on ECHO, PAD on LLE requiring angioplasty.  He fell prior to leaving the hospital and had difficulty getting into the car for discharge - he required 3 people to get into the car. At discharge, he was started on lopressor, plavix and midodrine.  After returning home, he continued to have weakness, nausea and vomiting.  He was readmitted to [facility] from 11/24 - 11/26 for acute on chronic systolic CHF, weakness. Lopressor was held and he was noted to have an episode of asymptomatic VT on 11/25.  He is normally a T/Th/S HD patient and was changed to Monday for the week of Thanksgiving for the holiday. He was unable to make it to HD on Monday due to fatigue. He was discharged off his lopressor due to soft BP's.   Wife reports am of admit he woke and was short of breath.  She tried oxygen and CPAP but they did not relieve his dyspnea.  She reports he "passed out" and EMS was called. He became responsive during the 911 call and EMS transported him to the ER.  In the ER his labs were relatively normal.  Wife notes he had dilaudid on 11/27 at 4pm for a wound VAC  change.  In the ER, he had an episode of VT and required 2 min CPR with return of ROSC after 1 epi and normal mental status.  He was placed on BiPAP after for comfort.   PCCM called for evaluation.   Past Medical History  DM II  ESRD  Chronic Combined CHF / HFrEF  CAD - Inferior STEMI 10/2009 s/p PCI of RCA, s/p CABG PAD  Osteomyelitis - left great toe s/p amputation at [facility], d/c'd to SNF on 11/21.   Significant Hospital Events   11/28 Admit, 2 min VT arrest 11/30 R/LHC/ iHD  Consults:  Cardiology  Nephrology   Procedures:  11/30 L femoral Trialysis catheter  Significant Diagnostic Tests:  11/28 CXR>>Cardiac enlargement with improving vascular congestion. Atelectasis in the left base with possible small left pleural effusion, unchanged.  11/28 Echo>>No mural thrombus with Definity contrast. Left ventricular ejection fraction, by estimation, is <20%. The left ventricle has severely decreased function. The left ventricle demonstrates global hypokinesis. The left ventricular internal cavity size was moderately dilated. Left ventricular diastolic parameters are consistent with Grade II diastolic dysfunction (pseudonormalization). Elevated left ventricular end-diastolic pressure.   11/30 R/LHC >> Ao = 99/63 (77) LV = 100/35 RA =  32 RV = 60/26 PA = 66/23 (43) PCW = 35 Fick cardiac output/index = 4.7/2.1 PVR =  1.7 WU PAPi = 1.3 FA sat =100% PA sat = 57%, 60%  1. LM: Totally occluded ostially (chronic) 2. RCA: Newly occluded in midsection (new since CABG 2018) 3. LIMA: Atretic 4. Free RIMA to LAD: WIdely patent. LAD is large vessel free of significant stenosis after graft insertion 5. SVG to OM1: Widely patent. Backfills a large portion of the LCX. There is a 70% lesion prior to graft insertion 6. Severe biventricular HF with markedly elevated filling pressures  Micro Data:   Antimicrobials:    Interim history/subjective:  Restless with poor sleep overnight, complaints of  discomfort from being in bed today.  Increased Levophed requirement with increasing lactic acid, likely secondary to worsening cardiogenic shock.  -4.7L yesterday and remains volume overloaded   Objective   Blood pressure (!) 87/51, pulse 77, temperature 97.7 F (36.5 C), resp. rate 11, height 5' 6.5" (1.689 m), weight 107 kg, SpO2 (!) 83 %.    FiO2 (%):  [28 %] 28 %   Intake/Output Summary (Last 24 hours) at 2020/10/25 0853 Last data filed at 10-25-20 0043 Gross per 24 hour  Intake 279.3 ml  Output 332 ml  Net -52.7 ml   Filed Weights   09/01/2020 0500 09/30/20 0500 10/01/20 0600  Weight: 121.4 kg 117.9 kg 107 kg    General:  Obese M, chronically ill appearing M, restless  Male sedated with morphine clear respirations heart sounds are regular. Resolved Hospital Problem list     Assessment & Plan:   10/25/20 now full comfort care Continue morphine drip Transition to hospital service 10/03/2020  Best practice (evaluated daily)  Currently comfort care, on morphine drip, wife updated at bedside.  Labs   CBC: Recent Labs  Lab 09/18/2020 0356 09/13/2020 0923 09/28/20 0042 09/28/20 0042 09/11/2020 0049 09/26/2020 0821 09/14/2020 0827 09/01/2020 0828 09/02/2020 1449 09/30/20 1422 10/01/20 0537  WBC 13.1*  --  18.9*  --  18.4*  --   --   --  14.4* 29.0* 28.5*  NEUTROABS 10.0*  --   --   --   --   --   --   --   --   --   --   HGB 10.9*   < > 10.5*   < > 11.6*   < > 11.9* 12.6* 11.9* 11.3* 11.2*  HCT 33.7*   < > 32.0*   < > 35.2*   < > 35.0* 37.0* 36.6* 34.4* 33.3*  MCV 96.8  --  95.8  --  94.6  --   --   --  95.8 95.6 96.0  PLT 302  --  301  --  296  --   --   --  299 254 252   < > = values in this interval not displayed.    Basic Metabolic Panel: Recent Labs  Lab 09/26/2020 1649 09/18/2020 1649 09/28/20 0042 09/28/20 0042 09/02/2020 0049 09/13/2020 0821 09/23/2020 0828 09/03/2020 1449 09/30/20 0059 09/30/20 1839 10/01/20 0537  NA 136   < > 136   < > 134*   < > 134* 135 135 133*  136  K 4.4   < > 4.8   < > 4.5   < > 4.1 4.4 4.8 5.1 5.2*  CL 91*   < > 91*   < > 88*  --   --  89* 90* 93* 92*  CO2 24   < > 24   < > 23  --   --  23 17* 17* 15*  GLUCOSE  290*   < > 263*   < > 197*  --   --  119* 67* 124* 120*  BUN 19   < > 22   < > 31*  --   --  34* 30* 18 16  CREATININE 7.07*   < > 7.41*   < > 8.60*  --   --  9.02* 7.48* 4.17* 3.73*  CALCIUM 10.1   < > 10.1   < > 10.4*  --   --  10.3 10.1 9.6 9.8  MG 2.5*  --  2.6*  --  2.7*  --   --   --  2.9*  --  2.7*  PHOS 6.1*  --   --   --   --   --   --  6.9* 6.2* 5.0* 4.1   < > = values in this interval not displayed.   GFR: Estimated Creatinine Clearance: 20.7 mL/min (A) (by C-G formula based on SCr of 3.73 mg/dL (H)). Recent Labs  Lab 09/02/2020 0049 09/17/2020 1449 09/30/20 1422 09/30/20 1839 10/01/20 0537  WBC 18.4* 14.4* 29.0*  --  28.5*  LATICACIDVEN  --   --  6.8* 8.3*  --     Liver Function Tests: Recent Labs  Lab 09/21/2020 1649 09/03/2020 1649 09/28/20 0042 09/18/2020 1449 09/30/20 0059 09/30/20 1839 10/01/20 0537  AST 74*  --  71*  --   --   --   --   ALT 45*  --  50*  --   --   --   --   ALKPHOS 158*  --  145*  --   --   --   --   BILITOT 1.5*  --  1.3*  --   --   --   --   PROT 7.6  --  7.1  --   --   --   --   ALBUMIN 3.3*   < > 3.2* 3.4* 3.5 3.3* 3.2*   < > = values in this interval not displayed.   No results for input(s): LIPASE, AMYLASE in the last 168 hours. No results for input(s): AMMONIA in the last 168 hours.  ABG    Component Value Date/Time   PHART 7.389 09/03/2020 0821   PCO2ART 44.4 09/11/2020 0821   PO2ART 183 (H) 09/24/2020 0821   HCO3 30.5 (H) 09/15/2020 0828   TCO2 32 09/14/2020 0828   O2SAT 60.0 09/16/2020 0828     Coagulation Profile: No results for input(s): INR, PROTIME in the last 168 hours.  Cardiac Enzymes: No results for input(s): CKTOTAL, CKMB, CKMBINDEX, TROPONINI in the last 168 hours.  HbA1C: Hgb A1c MFr Bld  Date/Time Value Ref Range Status  09/23/2020  11:19 AM 7.2 (H) 4.8 - 5.6 % Final    Comment:    (NOTE) Pre diabetes:          5.7%-6.4%  Diabetes:              >6.4%  Glycemic control for   <7.0% adults with diabetes     CBG: Recent Labs  Lab 09/30/20 1549 09/30/20 1948 10/01/20 0027 10/01/20 0508 10/01/20 [place] Angeli Demilio ACNP Acute Care Nurse Practitioner [place] Please consult [place] 10-12-20, 8:54 AM

## 2020-10-31 NOTE — Consult Note (Addendum)
[place] Nurse wound follow up Patient receiving care in [facility] 6N28 Vac change to left foot. Patient was not responsive during the vac change verbally but did open his eyes. Wound type: Left anterior foot with full thickness wound.  Wound bed: 20% yellow adipose tissue, 80% red and moist Drainage: No drainage in canister Periwound: Intact  Dressing procedure/placement/frequency:  Removed one piece of black foam and skin barrier Replaced with 2 small pieces of black foam without skin barrier, (none in the room). Drape applied, immediate seal obtained at 125 mm continuous suction.  [place] team will plan to change dressing MWF  Topical treatment orders provided for staff nurses as follows when the patient is discharged:When pt is ready to discharge, remove hospital Vac and dressing and apply moist gauze dressing and kerlex, and send home NPWT device/charger home with family or document clearly where the items area and make sure to transfer with patient. If leak occurs over the weekend, DO NOT TURN MACHINE OFF and leave dressing on. If seal can not be obtained, remove dressing and replace with moistened saline gauze, dry 2 x 2 and cover with kerlex and secure with tape. [place] team will follow up on Monday. Will order more supplies to be placed at the bedside for Monday  [place]. Tamala Julian, MSN, RN, [place], Lysle Pearl, [facility] Wound Treatment Associate Pager [phone]

## 2020-10-31 DEATH — deceased

## 2022-05-24 IMAGING — DX DG CHEST 1V PORT
1 series · 1 of 1 positions shown · non-contrast
Comparison: 09/23/2020

CLINICAL DATA: Shortness of breath.  Drug overdose.

EXAM:
PORTABLE CHEST 1 VIEW

[chest]
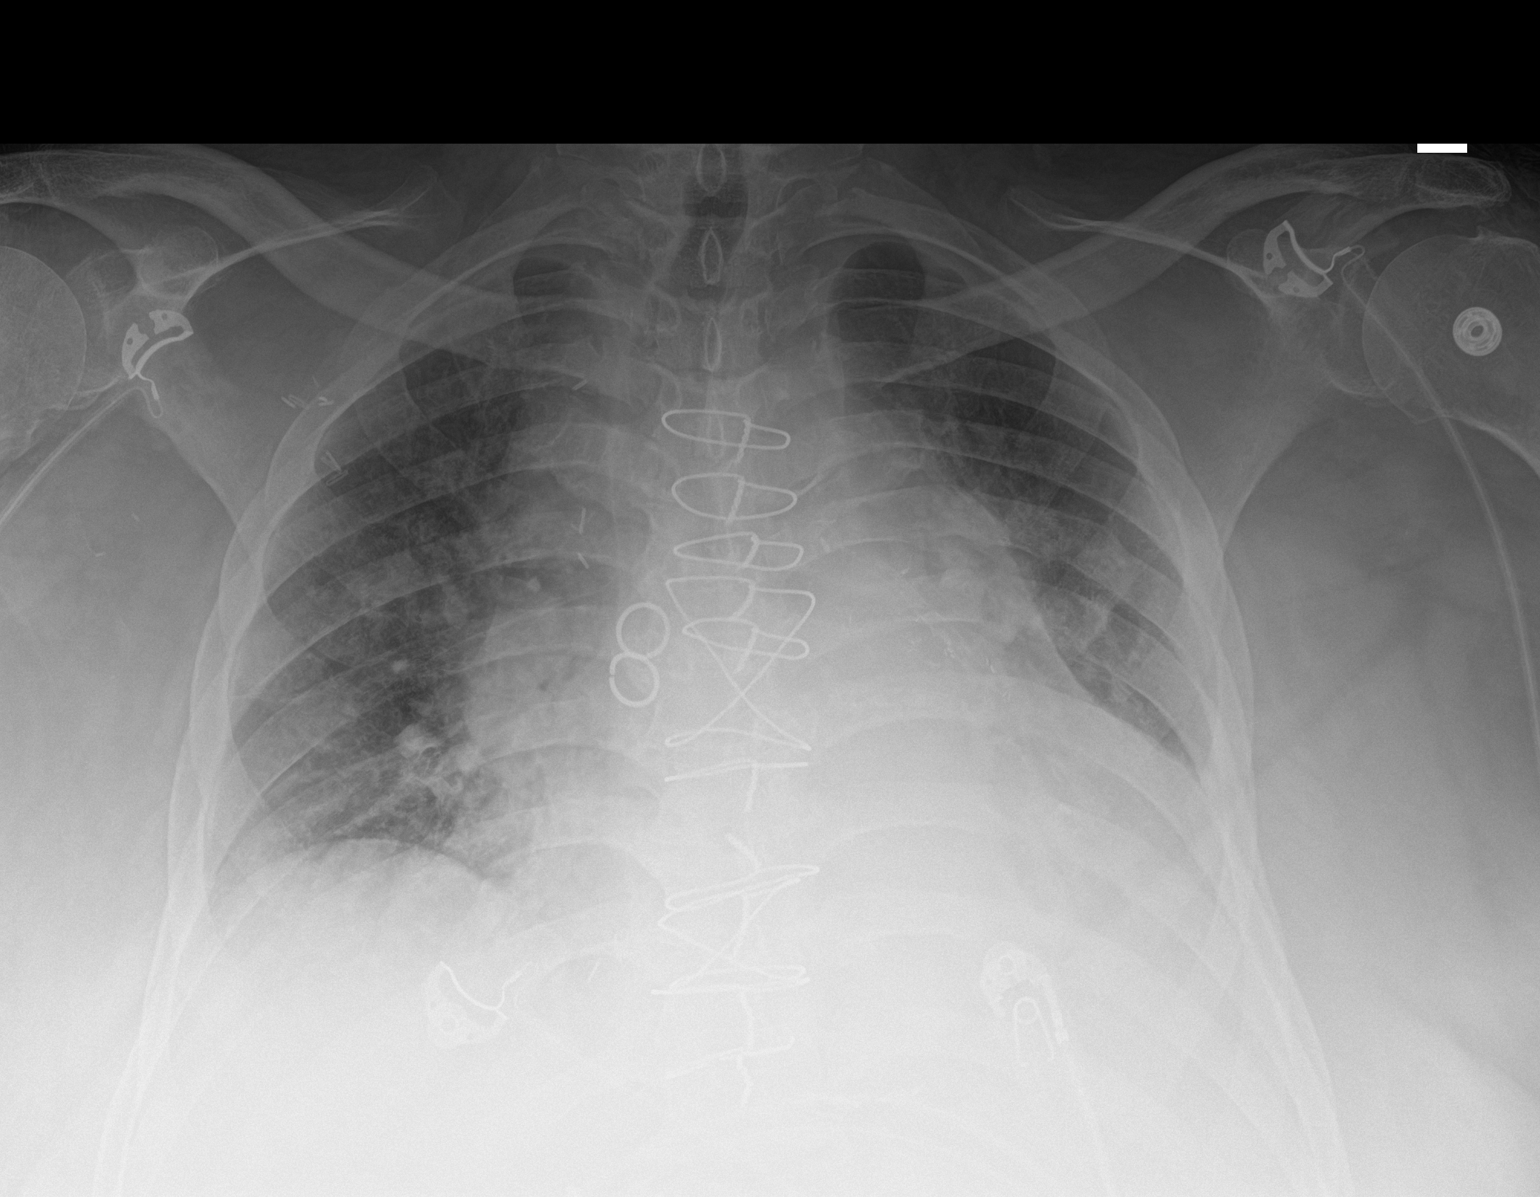

[1 of 1 positions shown; findings below may reference images not displayed]

FINDINGS: Postoperative changes in the mediastinum. Diffuse cardiac
enlargement. Mild vascular congestion. No edema or consolidation.
Suggestion of atelectasis in the left lung base. Possible pleural
effusion or thickening in the costophrenic angles.
IMPRESSION: Cardiac enlargement with mild vascular congestion. No edema or
consolidation. Atelectasis in the left lung base. Possible small
pleural effusions or thickening in the costophrenic angles.
